# Patient Record
Sex: Female | Born: 1955
Health system: Southern US, Community
[De-identification: ages and names within clinical notes are randomized; demographics above are authoritative.]

## PROBLEM LIST (undated history)

## (undated) DIAGNOSIS — H9313 Tinnitus, bilateral: Secondary | ICD-10-CM

## (undated) DIAGNOSIS — G8929 Other chronic pain: Secondary | ICD-10-CM

## (undated) DIAGNOSIS — D649 Anemia, unspecified: Secondary | ICD-10-CM

## (undated) DIAGNOSIS — G43611 Persistent migraine aura with cerebral infarction, intractable, with status migrainosus: Secondary | ICD-10-CM

## (undated) DIAGNOSIS — I10 Essential (primary) hypertension: Secondary | ICD-10-CM

## (undated) DIAGNOSIS — F419 Anxiety disorder, unspecified: Secondary | ICD-10-CM

## (undated) DIAGNOSIS — T8859XA Other complications of anesthesia, initial encounter: Secondary | ICD-10-CM

## (undated) DIAGNOSIS — G5600 Carpal tunnel syndrome, unspecified upper limb: Secondary | ICD-10-CM

## (undated) DIAGNOSIS — F32A Depression, unspecified: Secondary | ICD-10-CM

## (undated) DIAGNOSIS — Z8639 Personal history of other endocrine, nutritional and metabolic disease: Secondary | ICD-10-CM

## (undated) DIAGNOSIS — E669 Obesity, unspecified: Secondary | ICD-10-CM

## (undated) DIAGNOSIS — E119 Type 2 diabetes mellitus without complications: Secondary | ICD-10-CM

## (undated) DIAGNOSIS — E785 Hyperlipidemia, unspecified: Secondary | ICD-10-CM

## (undated) DIAGNOSIS — R519 Headache, unspecified: Secondary | ICD-10-CM

## (undated) DIAGNOSIS — F329 Major depressive disorder, single episode, unspecified: Secondary | ICD-10-CM

## (undated) DIAGNOSIS — M1990 Unspecified osteoarthritis, unspecified site: Secondary | ICD-10-CM

## (undated) HISTORY — DX: Personal history of other endocrine, nutritional and metabolic disease: Z86.39

## (undated) HISTORY — DX: Obesity, unspecified: E66.9

## (undated) HISTORY — PX: TUBAL LIGATION: SHX77

## (undated) HISTORY — PX: CARDIAC CATHETERIZATION: SHX172

## (undated) HISTORY — PX: LEG SURGERY: SHX1003

## (undated) HISTORY — PX: HERNIA REPAIR: SHX51

## (undated) HISTORY — DX: Hyperlipidemia, unspecified: E78.5

## (undated) HISTORY — PX: APPENDECTOMY: SHX54

## (undated) HISTORY — DX: Other chronic pain: G89.29

## (undated) HISTORY — DX: Major depressive disorder, single episode, unspecified: F32.9

## (undated) HISTORY — DX: Anxiety disorder, unspecified: F41.9

## (undated) HISTORY — DX: Depression, unspecified: F32.A

## (undated) HISTORY — DX: Unspecified osteoarthritis, unspecified site: M19.90

## (undated) HISTORY — DX: Headache, unspecified: R51.9

## (undated) HISTORY — DX: Tinnitus, bilateral: H93.13

---
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
## 2000-03-21 LAB — CONVERTED CEMR LAB

## 2006-09-01 ENCOUNTER — Ambulatory Visit: Payer: Self-pay | Admitting: Family Medicine

## 2006-09-01 DIAGNOSIS — E785 Hyperlipidemia, unspecified: Secondary | ICD-10-CM | POA: Insufficient documentation

## 2006-09-01 DIAGNOSIS — F329 Major depressive disorder, single episode, unspecified: Secondary | ICD-10-CM

## 2006-09-01 DIAGNOSIS — D6489 Other specified anemias: Secondary | ICD-10-CM

## 2006-09-01 DIAGNOSIS — I10 Essential (primary) hypertension: Secondary | ICD-10-CM | POA: Insufficient documentation

## 2006-09-15 ENCOUNTER — Encounter: Payer: Self-pay | Admitting: Family Medicine

## 2006-09-15 LAB — CONVERTED CEMR LAB
Calcium: 8.9 mg/dL
Glucose, Bld: 239 mg/dL
Hemoglobin: 11.2 g/dL
LDL Cholesterol: 170 mg/dL
Platelets: 389 10*3/uL
Potassium: 4.1 meq/L
Sodium: 135 meq/L
Total Protein: 6.6 g/dL
Triglycerides: 312 mg/dL
WBC: 8.1 10*3/uL

## 2006-09-20 ENCOUNTER — Encounter: Payer: Self-pay | Admitting: Family Medicine

## 2006-09-20 LAB — CONVERTED CEMR LAB: Cholesterol, target level: 200 mg/dL

## 2006-10-05 ENCOUNTER — Telehealth: Payer: Self-pay | Admitting: Family Medicine

## 2006-12-08 ENCOUNTER — Other Ambulatory Visit: Admission: RE | Admit: 2006-12-08 | Discharge: 2006-12-08 | Payer: Self-pay | Admitting: Family Medicine

## 2006-12-08 ENCOUNTER — Encounter: Admission: RE | Admit: 2006-12-08 | Discharge: 2006-12-08 | Payer: Self-pay | Admitting: Family Medicine

## 2006-12-08 ENCOUNTER — Encounter: Payer: Self-pay | Admitting: Family Medicine

## 2006-12-08 ENCOUNTER — Ambulatory Visit: Payer: Self-pay | Admitting: Family Medicine

## 2006-12-08 IMAGING — MG MM DIGITAL SCREENING
7 series · 7 of 7 positions shown · non-contrast
Comparison: none

DG SCREEN MAMMOGRAM BILATERAL
Bilateral CC and MLO view(s) were taken.
Technologist: ARICHI

DIGITAL SCREENING MAMMOGRAM WITH CAD:
The breast tissue is heterogeneously dense.  No masses or malignant type calcifications are 
identified.

[R CC]
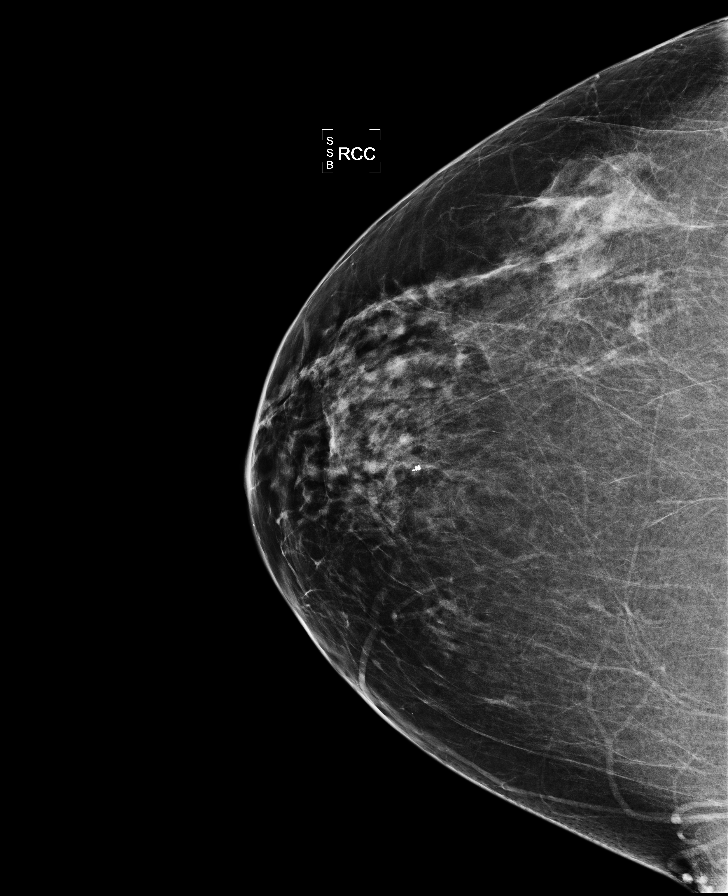

[L CC]
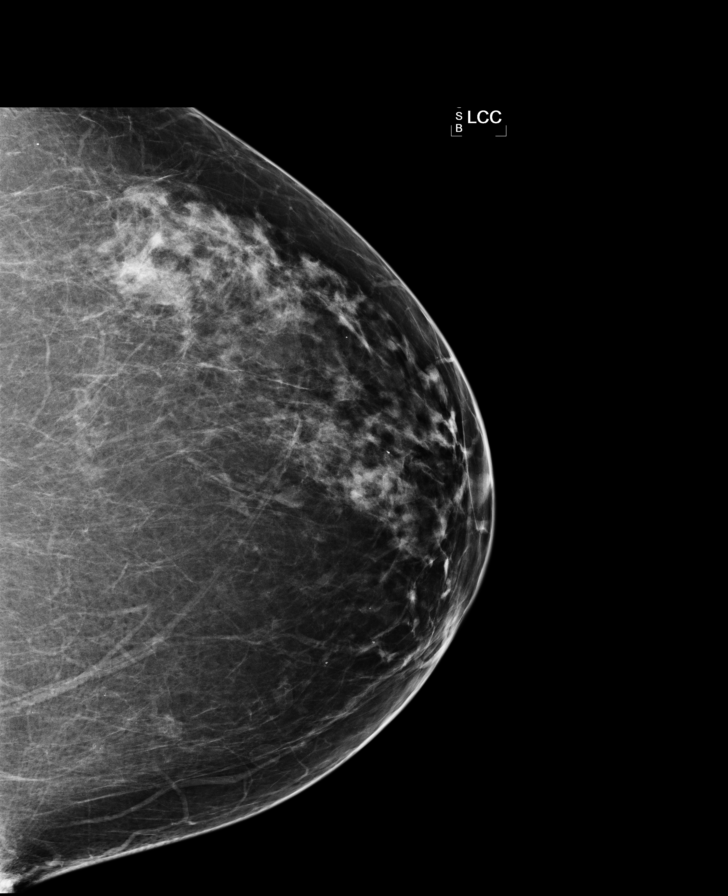

[L MLO (1 of 2)]
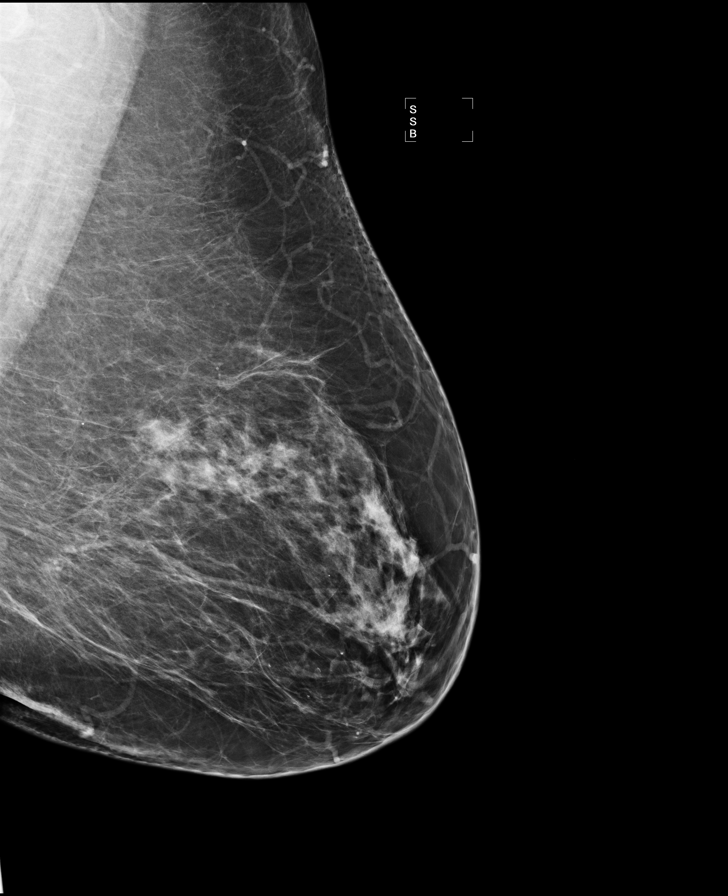

[R MLO (1 of 2)]
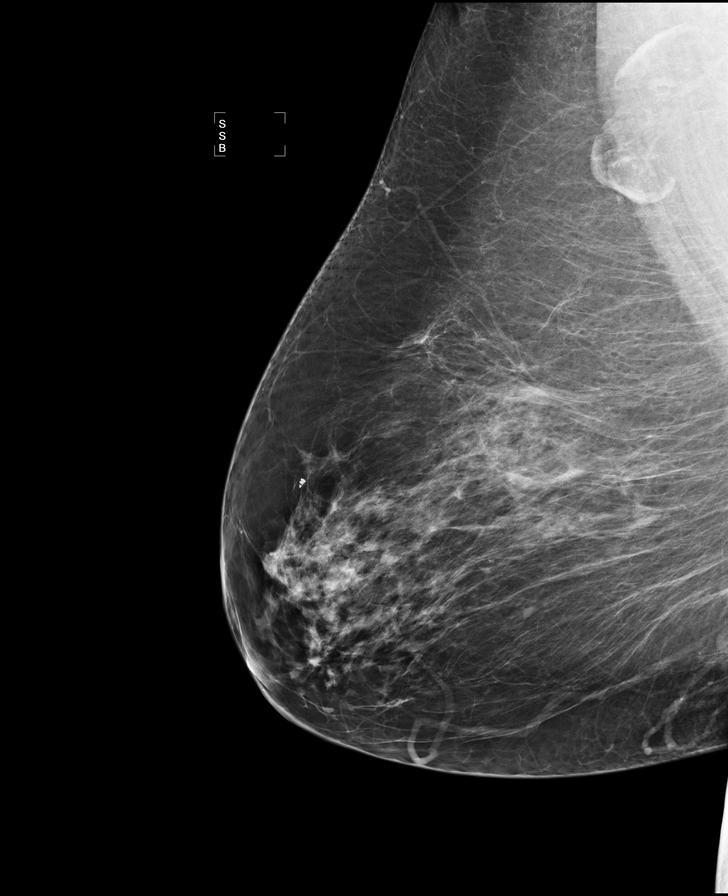

[L CV]
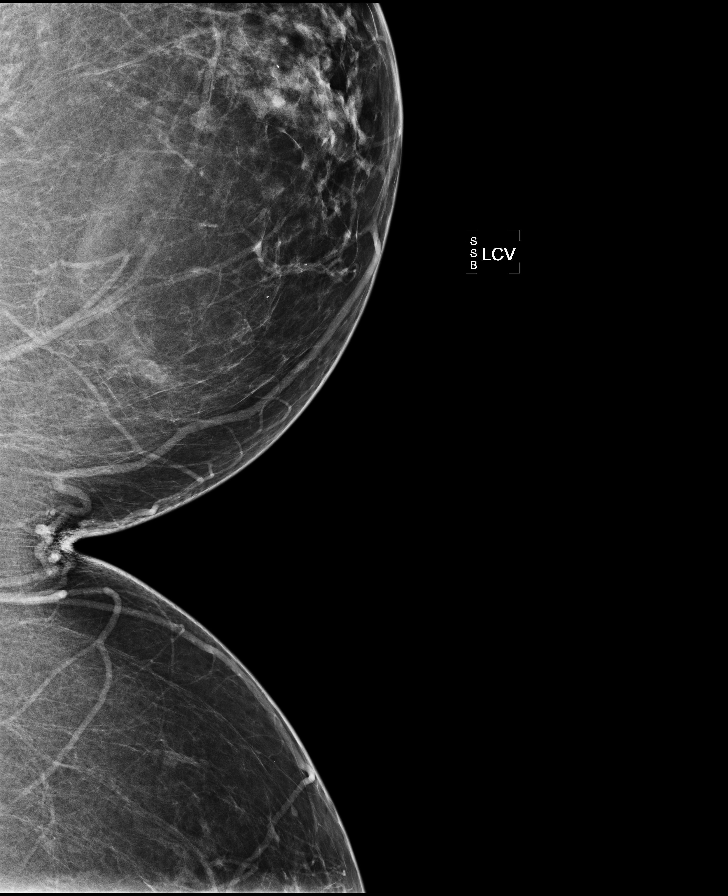

[R MLO (2 of 2)]
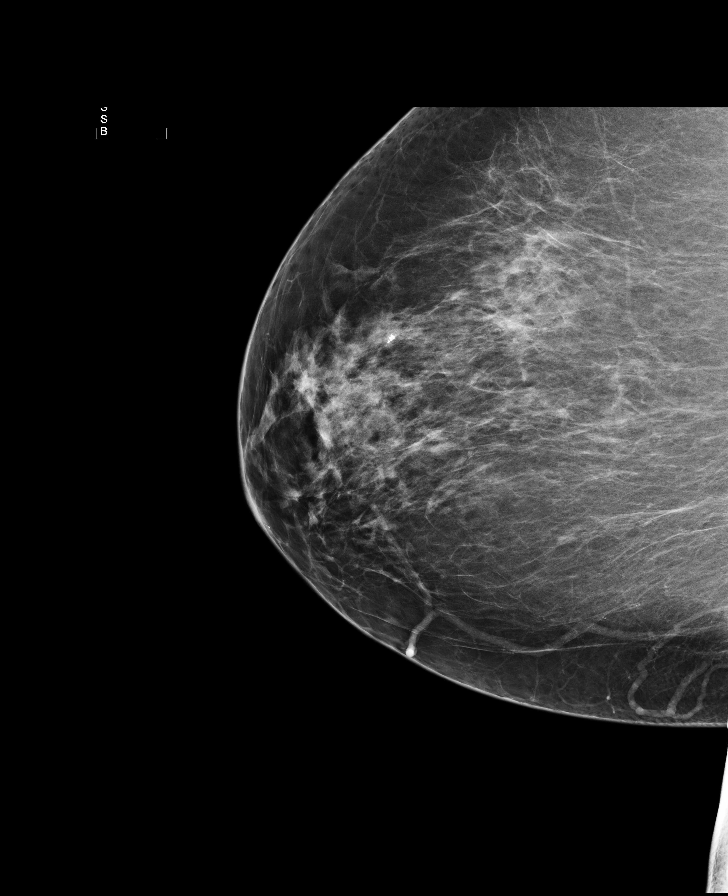

[L MLO (2 of 2)]
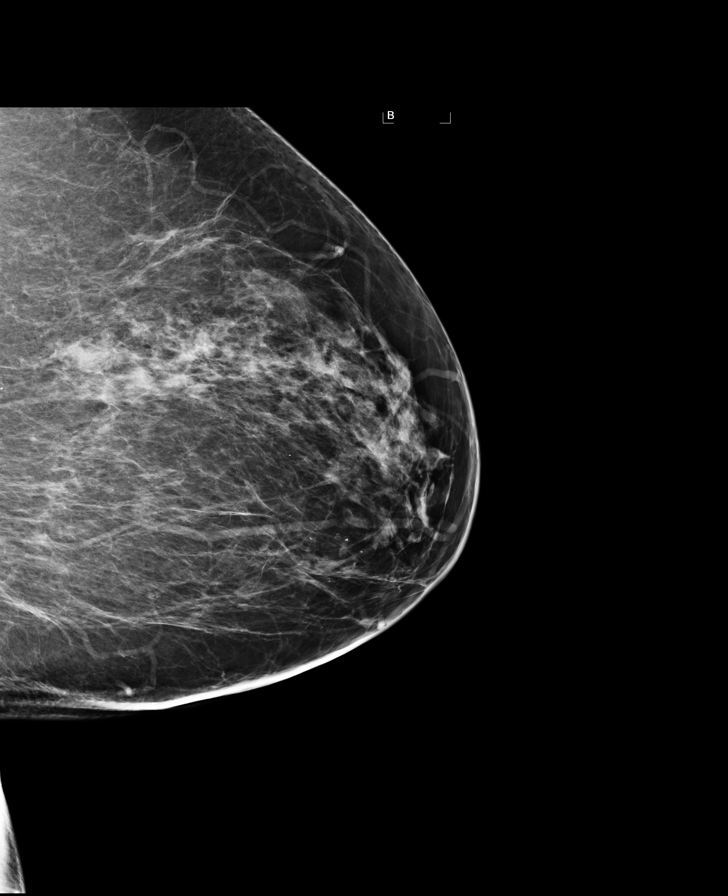

[7 of 7 positions shown; findings below may reference images not displayed]

IMPRESSION: No specific mammographic evidence of malignancy.  Next screening mammogram is recommended in one 
year.

ASSESSMENT: Negative - BI-RADS 1

Screening mammogram in 1 year.
THIS WAS ANALAYZED BY COMPUTER AIDED DETECTION. , THIS PROCEDURE WAS A DIGITAL MAMMOGRAM.

## 2007-01-08 ENCOUNTER — Telehealth: Payer: Self-pay | Admitting: Family Medicine

## 2007-02-06 ENCOUNTER — Ambulatory Visit: Payer: Self-pay | Admitting: Family Medicine

## 2007-02-06 ENCOUNTER — Telehealth (INDEPENDENT_AMBULATORY_CARE_PROVIDER_SITE_OTHER): Payer: Self-pay | Admitting: *Deleted

## 2007-02-06 DIAGNOSIS — Z794 Long term (current) use of insulin: Secondary | ICD-10-CM

## 2007-02-06 DIAGNOSIS — I639 Cerebral infarction, unspecified: Secondary | ICD-10-CM | POA: Insufficient documentation

## 2007-02-06 DIAGNOSIS — G43611 Persistent migraine aura with cerebral infarction, intractable, with status migrainosus: Secondary | ICD-10-CM

## 2007-02-06 DIAGNOSIS — R809 Proteinuria, unspecified: Secondary | ICD-10-CM

## 2007-02-06 DIAGNOSIS — R0789 Other chest pain: Secondary | ICD-10-CM | POA: Insufficient documentation

## 2007-02-06 DIAGNOSIS — E1129 Type 2 diabetes mellitus with other diabetic kidney complication: Secondary | ICD-10-CM | POA: Insufficient documentation

## 2007-02-06 HISTORY — DX: Cerebral infarction, unspecified: I63.9

## 2007-02-06 HISTORY — DX: Persistent migraine aura with cerebral infarction, intractable, with status migrainosus: G43.611

## 2007-02-06 LAB — CONVERTED CEMR LAB: Blood Glucose, Fasting: 172 mg/dL

## 2007-04-19 ENCOUNTER — Encounter: Payer: Self-pay | Admitting: Family Medicine

## 2007-06-21 ENCOUNTER — Ambulatory Visit: Payer: Self-pay | Admitting: Family Medicine

## 2007-06-21 DIAGNOSIS — R059 Cough, unspecified: Secondary | ICD-10-CM | POA: Insufficient documentation

## 2007-06-21 DIAGNOSIS — R05 Cough: Secondary | ICD-10-CM | POA: Insufficient documentation

## 2007-06-21 LAB — CONVERTED CEMR LAB
Blood Glucose, AC Bkfst: 144 mg/dL
Microalbumin U total vol: 80 mg/L

## 2007-07-09 ENCOUNTER — Telehealth: Payer: Self-pay | Admitting: Family Medicine

## 2007-07-19 ENCOUNTER — Telehealth: Payer: Self-pay | Admitting: Family Medicine

## 2007-08-01 ENCOUNTER — Encounter: Payer: Self-pay | Admitting: Family Medicine

## 2007-08-17 ENCOUNTER — Telehealth: Payer: Self-pay | Admitting: Family Medicine

## 2007-12-20 ENCOUNTER — Ambulatory Visit: Payer: Self-pay | Admitting: Family Medicine

## 2007-12-20 DIAGNOSIS — R5383 Other fatigue: Secondary | ICD-10-CM

## 2007-12-20 DIAGNOSIS — R5381 Other malaise: Secondary | ICD-10-CM

## 2007-12-20 LAB — CONVERTED CEMR LAB: Hgb A1c MFr Bld: 7.5 %

## 2008-01-25 ENCOUNTER — Encounter: Payer: Self-pay | Admitting: Family Medicine

## 2008-01-25 LAB — CONVERTED CEMR LAB
BUN: 12 mg/dL
CO2: 29 meq/L
Calcium: 8.6 mg/dL
Chloride: 102 meq/L
Cholesterol: 270 mg/dL
Creatinine, Ser: 0.7 mg/dL
Folate: >20 ng/mL
GFR calc non Af Amer: >60 mL/min
Glucose, Bld: 185 mg/dL
LDL Cholesterol: 186 mg/dL
Vitamin B-12: 603 pg/mL

## 2008-02-05 ENCOUNTER — Encounter: Payer: Self-pay | Admitting: Family Medicine

## 2008-02-08 ENCOUNTER — Encounter: Payer: Self-pay | Admitting: Family Medicine

## 2008-02-13 ENCOUNTER — Encounter: Payer: Self-pay | Admitting: Family Medicine

## 2008-06-27 ENCOUNTER — Telehealth: Payer: Self-pay | Admitting: Family Medicine

## 2008-06-27 DIAGNOSIS — E568 Deficiency of other vitamins: Secondary | ICD-10-CM

## 2013-08-03 ENCOUNTER — Emergency Department (HOSPITAL_BASED_OUTPATIENT_CLINIC_OR_DEPARTMENT_OTHER)
Admission: EM | Admit: 2013-08-03 | Discharge: 2013-08-03 | Disposition: A | Discharge status: Home / Self Care | Payer: No Typology Code available for payment source | Attending: Emergency Medicine | Admitting: Emergency Medicine

## 2013-08-03 ENCOUNTER — Encounter (HOSPITAL_BASED_OUTPATIENT_CLINIC_OR_DEPARTMENT_OTHER): Payer: Self-pay | Admitting: Emergency Medicine

## 2013-08-03 DIAGNOSIS — Z794 Long term (current) use of insulin: Secondary | ICD-10-CM | POA: Insufficient documentation

## 2013-08-03 DIAGNOSIS — I10 Essential (primary) hypertension: Secondary | ICD-10-CM | POA: Insufficient documentation

## 2013-08-03 DIAGNOSIS — Z9889 Other specified postprocedural states: Secondary | ICD-10-CM | POA: Insufficient documentation

## 2013-08-03 DIAGNOSIS — Z7982 Long term (current) use of aspirin: Secondary | ICD-10-CM | POA: Insufficient documentation

## 2013-08-03 DIAGNOSIS — Z79899 Other long term (current) drug therapy: Secondary | ICD-10-CM | POA: Insufficient documentation

## 2013-08-03 DIAGNOSIS — M79609 Pain in unspecified limb: Secondary | ICD-10-CM | POA: Insufficient documentation

## 2013-08-03 DIAGNOSIS — G5600 Carpal tunnel syndrome, unspecified upper limb: Secondary | ICD-10-CM | POA: Insufficient documentation

## 2013-08-03 DIAGNOSIS — E119 Type 2 diabetes mellitus without complications: Secondary | ICD-10-CM | POA: Insufficient documentation

## 2013-08-03 DIAGNOSIS — Z862 Personal history of diseases of the blood and blood-forming organs and certain disorders involving the immune mechanism: Secondary | ICD-10-CM | POA: Insufficient documentation

## 2013-08-03 DIAGNOSIS — M79641 Pain in right hand: Secondary | ICD-10-CM

## 2013-08-03 DIAGNOSIS — M79642 Pain in left hand: Secondary | ICD-10-CM

## 2013-08-03 HISTORY — DX: Type 2 diabetes mellitus without complications: E11.9

## 2013-08-03 HISTORY — DX: Essential (primary) hypertension: I10

## 2013-08-03 HISTORY — DX: Anemia, unspecified: D64.9

## 2013-08-03 HISTORY — DX: Carpal tunnel syndrome, unspecified upper limb: G56.00

## 2013-08-03 MED ORDER — HYDROCODONE-ACETAMINOPHEN 5-325 MG PO TABS: 1.0000 | ORAL_TABLET | Freq: Four times a day (QID) | ORAL | Status: DC | PRN

## 2013-08-03 NOTE — ED Notes (Signed)
Sudden onset of pain in right hand and lower arm at 11pm last night.  30 min later pain started in her left hand and forearm up to elbow.  Pain has continued, worse on left, all night.  Pt took Tylenol and Melatonin but has not been able to sleep all night due to the pain.

## 2013-08-03 NOTE — ED Provider Notes (Addendum)
CSN: [id number]     Arrival date & time 08/03/13  0450 History   First MD Initiated Contact with Patient 08/03/13 0559     Chief Complaint  Patient presents with  . Hand Pain     (Consider location/radiation/quality/duration/timing/severity/associated sxs/prior Treatment) HPI This is a 58 year old female with a remote history of carpal tunnel syndrome. She is here with pain in her hands that began yesterday evening about 11 PM. The pain began in her right hand and then about 30 minutes later began in the left hand. She describes the pain as an ache. It is moderate in intensity enough to keep her awake. Pain is worse with movement of her hands and fingers. There is radiation of the pain up the volar forearms on the ulnar side bilaterally. The pain is associated with paresthesias but not frank anesthesia. She is not aware of any injury to her hands. She denies repetitive work with her hands but she has been holding her iPad nightly for several hours. She does not localize the pain or paresthesias to any specific digits but rather is generalized. She has no history of diabetic neuropathy although she is diabetic. She states her sugar has been well controlled recently.  Past Medical History  Diagnosis Date  . Diabetes mellitus without complication   . Hypertension   . Anemia   . Carpal tunnel syndrome    Past Surgical History  Procedure Laterality Date  . Cardiac catheterization      Negative. Dx was Anxiety  . Leg surgery    . Appendectomy     No family history on file. History  Substance Use Topics  . Smoking status: Never Smoker   . Smokeless tobacco: Not on file  . Alcohol Use: No   OB History   Grav Para Term Preterm Abortions TAB SAB Ect Mult Living                 Review of Systems  All other systems reviewed and are negative.   Allergies  Aspirin  Home Medications   Prior to Admission medications   Medication Sig Start Date End Date Taking? Authorizing Provider   aspirin 81 MG tablet Take 81 mg by mouth at bedtime.   Yes Historical Provider, MD  DULoxetine (CYMBALTA) 60 MG capsule Take 60 mg by mouth daily.   Yes Historical Provider, MD  Insulin Glargine (LANTUS SOLOSTAR [place]) Inject 44 Units into the skin 2 (two) times daily.   Yes Historical Provider, MD  Liraglutide (VICTOZA) 18 MG/3ML SOPN Inject into the skin at bedtime.   Yes Historical Provider, MD  medroxyPROGESTERone (PROVERA) 10 MG tablet Take 10 mg by mouth daily.   Yes Historical Provider, MD  Pediatric Multivitamins-Iron (VITA DROPS/IRON PO) Take by mouth. VITA Iron   Yes Historical Provider, MD  quinapril-hydrochlorothiazide (ACCURETIC) 20-25 MG per tablet Take 1 tablet by mouth daily.   Yes Historical Provider, MD  rosuvastatin (CRESTOR) 10 MG tablet Take 10 mg by mouth daily.   Yes Historical Provider, MD  sitaGLIPtin-metformin (JANUMET) 50-1000 MG per tablet Take 1 tablet by mouth daily.   Yes Historical Provider, MD   BP 181/72  Pulse 76  Temp(Src) 98.2 F (36.8 C) (Oral)  Resp 20  Ht 4\' 11"  (1.499 m)  Wt 253 lb (114.76 kg)  BMI 51.07 kg/m2  SpO2 100%  Physical Exam General: Well-developed, well-nourished female in no acute distress; appearance consistent with age of record HENT: normocephalic; atraumatic Eyes: pupils equal, round and reactive to  light; extraocular muscles intact Neck: supple Heart: regular rate and rhythm Lungs: clear to auscultation bilaterally Abdomen: soft; nondistended; nontender; no masses or hepatosplenomegaly; bowel sounds present Extremities: No deformity; full range of motion; pulses normal Neurologic: Awake, alert and oriented; motor function intact in all extremities and symmetric; grip strength normal bilaterally; flexion and extension strength at the elbows normal bilaterally; negative Tinel's bilaterally; positive Phalen's bilaterally; sensation intact over both hands but with subjective alteration of sensation; no facial droop; normal  coordination speech; normal gait Skin: Warm and dry Psychiatric: Normal mood and affect    ED Course  Procedures (including critical care time)   MDM  Patient's exam is atypical for carpal tunnel syndrome. This may represent a neuropathy but diabetic neuropathy would not be expected to have such a rapid onset. She is otherwise neurologically intact. We will treat her pain and she will contact her primary care physician Monday morning.    Hanley SeamenJohn L Mikie Misner, MD 08/03/13 [phone]  Hanley SeamenJohn L Analea Muller, MD 08/03/13 [phone]

## 2013-08-03 NOTE — ED Notes (Signed)
Onset of pain to upper ext from elbow to hand  Rt first then 30 minutes later left,  Denies inj  States does hold i pad and ply on it at nights,  Grip equal both ext no change in pain w movement

## 2015-07-30 DIAGNOSIS — F419 Anxiety disorder, unspecified: Secondary | ICD-10-CM | POA: Insufficient documentation

## 2015-07-30 DIAGNOSIS — Z794 Long term (current) use of insulin: Secondary | ICD-10-CM | POA: Insufficient documentation

## 2015-07-30 DIAGNOSIS — F339 Major depressive disorder, recurrent, unspecified: Secondary | ICD-10-CM | POA: Insufficient documentation

## 2015-07-30 DIAGNOSIS — R809 Proteinuria, unspecified: Secondary | ICD-10-CM

## 2015-07-30 DIAGNOSIS — E1129 Type 2 diabetes mellitus with other diabetic kidney complication: Secondary | ICD-10-CM | POA: Insufficient documentation

## 2015-07-30 DIAGNOSIS — I2510 Atherosclerotic heart disease of native coronary artery without angina pectoris: Secondary | ICD-10-CM

## 2015-07-30 DIAGNOSIS — D509 Iron deficiency anemia, unspecified: Secondary | ICD-10-CM | POA: Insufficient documentation

## 2015-11-24 DIAGNOSIS — G4700 Insomnia, unspecified: Secondary | ICD-10-CM | POA: Insufficient documentation

## 2016-01-06 DIAGNOSIS — M180 Bilateral primary osteoarthritis of first carpometacarpal joints: Secondary | ICD-10-CM | POA: Insufficient documentation

## 2016-01-18 DIAGNOSIS — L732 Hidradenitis suppurativa: Secondary | ICD-10-CM | POA: Insufficient documentation

## 2016-01-18 DIAGNOSIS — L02229 Furuncle of trunk, unspecified: Secondary | ICD-10-CM | POA: Insufficient documentation

## 2016-08-10 DIAGNOSIS — S83242A Other tear of medial meniscus, current injury, left knee, initial encounter: Secondary | ICD-10-CM | POA: Insufficient documentation

## 2016-08-10 DIAGNOSIS — M1712 Unilateral primary osteoarthritis, left knee: Secondary | ICD-10-CM | POA: Insufficient documentation

## 2016-08-10 DIAGNOSIS — H9313 Tinnitus, bilateral: Secondary | ICD-10-CM | POA: Insufficient documentation

## 2016-09-13 DIAGNOSIS — R51 Headache: Secondary | ICD-10-CM

## 2016-09-13 DIAGNOSIS — R519 Headache, unspecified: Secondary | ICD-10-CM | POA: Insufficient documentation

## 2016-09-13 DIAGNOSIS — G5600 Carpal tunnel syndrome, unspecified upper limb: Secondary | ICD-10-CM | POA: Insufficient documentation

## 2017-09-12 DIAGNOSIS — R040 Epistaxis: Secondary | ICD-10-CM | POA: Insufficient documentation

## 2018-02-22 ENCOUNTER — Ambulatory Visit (INDEPENDENT_AMBULATORY_CARE_PROVIDER_SITE_OTHER): Payer: No Typology Code available for payment source | Admitting: Physician Assistant

## 2018-02-22 ENCOUNTER — Encounter: Payer: Self-pay | Admitting: Physician Assistant

## 2018-02-22 VITALS — BP 142/72 | HR 77 | Wt 259.0 lb

## 2018-02-22 DIAGNOSIS — Z7689 Persons encountering health services in other specified circumstances: Secondary | ICD-10-CM | POA: Diagnosis not present

## 2018-02-22 DIAGNOSIS — E1141 Type 2 diabetes mellitus with diabetic mononeuropathy: Secondary | ICD-10-CM

## 2018-02-22 DIAGNOSIS — E1129 Type 2 diabetes mellitus with other diabetic kidney complication: Secondary | ICD-10-CM | POA: Diagnosis not present

## 2018-02-22 DIAGNOSIS — E1169 Type 2 diabetes mellitus with other specified complication: Secondary | ICD-10-CM

## 2018-02-22 DIAGNOSIS — F330 Major depressive disorder, recurrent, mild: Secondary | ICD-10-CM

## 2018-02-22 DIAGNOSIS — Z1231 Encounter for screening mammogram for malignant neoplasm of breast: Secondary | ICD-10-CM

## 2018-02-22 DIAGNOSIS — Z794 Long term (current) use of insulin: Secondary | ICD-10-CM

## 2018-02-22 DIAGNOSIS — R809 Proteinuria, unspecified: Secondary | ICD-10-CM | POA: Diagnosis not present

## 2018-02-22 DIAGNOSIS — R600 Localized edema: Secondary | ICD-10-CM

## 2018-02-22 DIAGNOSIS — E1159 Type 2 diabetes mellitus with other circulatory complications: Secondary | ICD-10-CM | POA: Diagnosis not present

## 2018-02-22 DIAGNOSIS — Z0100 Encounter for examination of eyes and vision without abnormal findings: Secondary | ICD-10-CM

## 2018-02-22 DIAGNOSIS — E785 Hyperlipidemia, unspecified: Secondary | ICD-10-CM

## 2018-02-22 DIAGNOSIS — E119 Type 2 diabetes mellitus without complications: Secondary | ICD-10-CM

## 2018-02-22 DIAGNOSIS — I10 Essential (primary) hypertension: Secondary | ICD-10-CM

## 2018-02-22 DIAGNOSIS — K130 Diseases of lips: Secondary | ICD-10-CM

## 2018-02-22 LAB — POCT GLYCOSYLATED HEMOGLOBIN (HGB A1C): HBA1C, POC (CONTROLLED DIABETIC RANGE): 8.1 % — AB (ref 0.0–7.0)

## 2018-02-22 MED ORDER — SEMAGLUTIDE (1 MG/DOSE) 2 MG/1.5ML ~~LOC~~ SOPN
PEN_INJECTOR | SUBCUTANEOUS | 1 refills | Status: DC
Start: 2018-02-22 — End: 2018-03-02

## 2018-02-22 MED ORDER — AMLODIPINE BESYLATE 5 MG PO TABS: 5.0000 mg | ORAL_TABLET | Freq: Every day | ORAL | 0 refills | Status: DC

## 2018-02-22 MED ORDER — ROSUVASTATIN CALCIUM 40 MG PO TABS: 40.0000 mg | ORAL_TABLET | Freq: Every day | ORAL | 0 refills | Status: DC

## 2018-02-22 MED ORDER — MEDICAL COMPRESSION STOCKINGS MISC: 0 refills | Status: DC

## 2018-02-22 MED ORDER — PEN NEEDLES 32G X 4 MM MISC: 1.0000 | Freq: Two times a day (BID) | 1 refills | Status: DC

## 2018-02-22 MED ORDER — TRIAMCINOLONE ACETONIDE 0.5 % EX OINT: 1.0000 "application " | TOPICAL_OINTMENT | Freq: Two times a day (BID) | CUTANEOUS | 0 refills | Status: AC

## 2018-02-22 MED ORDER — SERTRALINE HCL 100 MG PO TABS: 100.0000 mg | ORAL_TABLET | Freq: Every day | ORAL | 1 refills | Status: DC

## 2018-02-22 MED ORDER — QUINAPRIL-HYDROCHLOROTHIAZIDE 20-12.5 MG PO TABS: 1.0000 | ORAL_TABLET | Freq: Every day | ORAL | 0 refills | Status: DC

## 2018-02-22 MED ORDER — INSULIN ISOPHANE & REGULAR (HUMAN 70-30)100 UNIT/ML KWIKPEN
PEN_INJECTOR | SUBCUTANEOUS | 0 refills | Status: DC
Start: 2018-02-22 — End: 2018-04-24

## 2018-02-22 MED ORDER — METFORMIN HCL 1000 MG PO TABS: 1000.0000 mg | ORAL_TABLET | Freq: Two times a day (BID) | ORAL | 0 refills | Status: DC

## 2018-02-22 MED FILL — ULTICARE PEN NDL 4MM 32G: 32G X 4 MM | 50 days supply | Qty: 100 | Fill #0

## 2018-02-22 MED FILL — HUMULIN 70/30 KWIKPEN: (70-30) 100 | 54 days supply | Qty: 45 | Fill #0

## 2018-02-22 MED FILL — ROSUVASTATIN CALCIUM 40 MG: 40 | 90 days supply | Qty: 90 | Fill #0

## 2018-02-22 MED FILL — QUINAPRIL-HCTZ 20-12.5 MG T: 20-12.5 | 90 days supply | Qty: 90 | Fill #0

## 2018-02-22 MED FILL — AMLODIPINE BESYLATE 5 MG TA: 5 | 90 days supply | Qty: 90 | Fill #0

## 2018-02-22 MED FILL — metFORMIN HCL 1000 MG TABS: 1000 | 90 days supply | Qty: 180 | Fill #0

## 2018-02-22 MED FILL — SERTRALINE HCL 100 MG TAB: 100 | 90 days supply | Qty: 90 | Fill #0

## 2018-02-22 MED FILL — TRIAMCINOLONE 0.5% OINTMENT: 0.5 | 14 days supply | Qty: 15 | Fill #0

## 2018-02-22 NOTE — Patient Instructions (Addendum)
Diabetes Preventive Care: - annual foot exam  - annual dilated eye exam with an eye doctor - self foot exams at least weekly - pneumonia vaccine once (booster in 5 years and at age 60) - annual influenza vaccine - twice yearly dental cleanings and yearly exam - goal blood pressure <140/90, ideally <130/80 - LDL cholesterol <70 - A1C <4.0 - body mass index (BMI) <25.0 - follow-up every 3 months if your A1C is not at goal - follow-up every 6 months if diabetes is well controlled    Edema Edema is an abnormal buildup of fluids in your bodytissues. Edema is somewhatdependent on gravity to pull the fluid to the lowest place in your body. That makes the condition more common in the legs and thighs (lower extremities). Painless swelling of the feet and ankles is common and becomes more likely as you get older. It is also common in looser tissues, like around your eyes. When the affected area is squeezed, the fluid may move out of that spot and leave a dent for a few moments. This dent is called pitting. What are the causes? There are many possible causes of edema. Eating too much salt and being on your feet or sitting for a long time can cause edema in your legs and ankles. Hot weather may make edema worse. Common medical causes of edema include:  Heart failure.  Liver disease.  Kidney disease.  Weak blood vessels in your legs.  Cancer.  An injury.  Pregnancy.  Some medications.  Obesity.  What are the signs or symptoms? Edema is usually painless.Your skin may look swollen or shiny. How is this diagnosed? Your health care provider may be able to diagnose edema by asking about your medical history and doing a physical exam. You may need to have tests such as X-rays, an electrocardiogram, or blood tests to check for medical conditions that may cause edema. How is this treated? Edema treatment depends on the cause. If you have heart, liver, or kidney disease, you need the  treatment appropriate for these conditions. General treatment may include:  Elevation of the affected body part above the level of your heart.  Compression of the affected body part. Pressure from elastic bandages or support stockings squeezes the tissues and forces fluid back into the blood vessels. This keeps fluid from entering the tissues.  Restriction of fluid and salt intake.  Use of a water pill (diuretic). These medications are appropriate only for some types of edema. They pull fluid out of your body and make you urinate more often. This gets rid of fluid and reduces swelling, but diuretics can have side effects. Only use diuretics as directed by your health care provider.  Follow these instructions at home:  Keep the affected body part above the level of your heart when you are lying down.  Do not sit still or stand for prolonged periods.  Do not put anything directly under your knees when lying down.  Do not wear constricting clothing or garters on your upper legs.  Exercise your legs to work the fluid back into your blood vessels. This may help the swelling go down.  Wear elastic bandages or support stockings to reduce ankle swelling as directed by your health care provider.  Eat a low-salt diet to reduce fluid if your health care provider recommends it.  Only take medicines as directed by your health care provider. Contact a health care provider if:  Your edema is not responding to treatment.  You have heart, liver, or kidney disease and notice symptoms of edema.  You have edema in your legs that does not improve after elevating them.  You have sudden and unexplained weight gain. Get help right away if:  You develop shortness of breath or chest pain.  You cannot breathe when you lie down.  You develop pain, redness, or warmth in the swollen areas.  You have heart, liver, or kidney disease and suddenly get edema.  You have a fever and your symptoms suddenly  get worse. This information is not intended to replace advice given to you by your health care provider. Make sure you discuss any questions you have with your health care provider. Document Released: 03/07/2005 Document Revised: 08/13/2015 Document Reviewed: 12/28/2012 Elsevier Interactive Patient Education  2017 [employer]

## 2018-02-22 NOTE — Progress Notes (Signed)
HPI:                                                                Jessica [name] is a 62 y.o. female [facility] today to establish care  Current concerns: medication refills  DMII: taking Novolin 45 units at breakfast and at supper, Metformin 1000 mg tiwce a day. She was prescribed Ozempic, but lost her insurance coverage in July and has not been able to afford it. She would like to re-start this because it was helping her with weight loss. Compliant with medications.  Denies polydipsia, polyuria. Denies blurred vision or vision change. Denies extremity pain, altered sensation and paresthesias.  Denies ulcers/wounds on feet. Hx of DKA/HHS: no Blood glucose readings: 130's Hypoglycemia frequency: none Severe hypoglycemia (requiring 3rd party assistance): none  Depression/Anxiety: taking Sertraline 100 mg without difficulty.She was previously taking 200 mg and Alprazolam prn, but states her anxiety is much better and she no longer requires Alprazolam. Denies symptoms of mania/hypomania. Denies suicidal thinking. Denies auditory/visual hallucinations.  Reports, dry, cracked painful skin at the corners of her mouth. She has tried aquafor, vaseline and burts bees medicated lip balm.   Reports chronic painless swelling of her left foot and lower leg present for years. Swelling waxes and wanes and is worse after prolonged sitting at work. She denies calf tenderness or claudication. Denies history of DVT. She has a left meniscal tear and OA of her left knee.  Depression screen PHQ 2/9 02/22/2018  Decreased Interest 2  Down, Depressed, Hopeless 1  PHQ - 2 Score 3  Altered sleeping 1  Tired, decreased energy 1  Change in appetite 3  Feeling bad or failure about yourself  1  Trouble concentrating 2  Moving slowly or fidgety/restless 0  Suicidal thoughts 0  PHQ-9 Score 11    GAD 7 : Generalized Anxiety Score 02/22/2018   Nervous, Anxious, on Edge 0  Control/stop worrying 1  Worry too much - different things 1  Trouble relaxing 0  Restless 0  Easily annoyed or irritable 0  Afraid - awful might happen 0  Total GAD 7 Score 2      Past Medical History:  Diagnosis Date  . Anemia   . Arthritis   . Carpal tunnel syndrome   . Depression   . Diabetes mellitus without complication (HCC)   . Hypertension   . Obesity    Past Surgical History:  Procedure Laterality Date  . APPENDECTOMY    . CARDIAC CATHETERIZATION     Negative. Dx was Anxiety  . HERNIA REPAIR    . LEG SURGERY    . TUBAL LIGATION     Social History   Tobacco Use  . Smoking status: Never Smoker  . Smokeless tobacco: Never Used  Substance Use Topics  . Alcohol use: No    Frequency: Never   family history includes Diabetes in her maternal aunt; Hyperlipidemia in her mother; Hypertension in her mother; Skin cancer in her maternal uncle; Stroke in her father; Thyroid cancer in her sister.    ROS: Review of Systems  Constitutional: Positive for malaise/fatigue.  Eyes:       + vision change  Cardiovascular: Positive for leg swelling.  Musculoskeletal: Positive for joint pain.       + heel pain     Medications: Current Outpatient Medications  Medication Sig Dispense Refill  . amLODipine (NORVASC) 5 MG tablet Take 1 tablet (5 mg total) by mouth daily. 90 tablet 0  . Insulin Isophane & Regular Human (HUMULIN 70/30 MIX) (70-30) 100 UNIT/ML PEN Inject 42 units with breakfast and 42 units with supper 15 pen 0  . Insulin Pen Needle (PEN NEEDLES) 32G X 4 MM MISC Inject 1 each into the skin 2 (two) times daily. 180 each 1  . metFORMIN (GLUCOPHAGE) 1000 MG tablet Take 1 tablet (1,000 mg total) by mouth 2 (two) times daily with a meal. 180 tablet 0  . quinapril-hydrochlorothiazide (ACCURETIC) 20-12.5 MG tablet Take 1 tablet by mouth daily. 90 tablet 0  . rosuvastatin (CRESTOR) 40 MG tablet Take 1 tablet (40 mg total) by mouth at  bedtime. 90 tablet 0  . Semaglutide, 1 MG/DOSE, 2 MG/1.5ML SOPN Inject 0.5 mg into the skin once a week for 28 days, THEN 1 mg once a week. 1.5 mL 1  . sertraline (ZOLOFT) 100 MG tablet Take 1 tablet (100 mg total) by mouth daily. 90 tablet 1  . aspirin 81 MG tablet Take 81 mg by mouth at bedtime.    Clinical research associate Bandages & Supports (MEDICAL COMPRESSION STOCKINGS) MISC Knee high medium compression stockings, large circumference, fit to patient's preference 2 each 0  . ferrous sulfate 325 (65 FE) MG tablet Take 65 mg by mouth daily.    Marland Kitchen Specialty Vitamins Products (MAGNESIUM, AMINO ACID CHELATE,) 133 MG tablet Take by mouth.    . triamcinolone ointment (KENALOG) 0.5 % Apply 1 application topically 2 (two) times daily for 14 days. Cycle 1 week on 1 week off 15 g 0   No current facility-administered medications for this visit.    Allergies  Allergen Reactions  . Aspirin     REACTION: cause bruising       Objective:  BP (!) 142/72   Pulse 77   Wt 259 lb (117.5 kg)   BMI 52.31 kg/m  Gen:  alert, not ill-appearing, no distress, appropriate for age, obese female HEENT: head normocephalic without obvious abnormality, conjunctiva and cornea clear, trachea midline Pulm: Normal work of breathing, normal phonation, clear to auscultation bilaterally, no wheezes, rales or rhonchi CV: Normal rate, regular rhythm, s1 and s2 distinct, no murmurs, clicks or rubs  Neuro: alert and oriented x 3, no tremor MSK: extremities atraumatic, normal gait and station, LLE significantly swollen compared to RLE, 2+ pitting edema of the midcalf, no calf tenderness, DP and PT pulses intact Skin: intact, no rashes on exposed skin, no jaundice, no cyanosis Psych: well-groomed, cooperative, good eye contact, depressed mood, affect is not mood-congruent, speech is articulate, and thought processes clear and goal-directed  Diabetic Foot Exam - Simple   Simple Foot Form Diabetic Foot exam was performed with the following  findings:  Yes 02/22/2018  3:11 PM  Visual Inspection No deformities, no ulcerations, no other skin breakdown bilaterally:  Yes Sensation Testing See comments:  Yes Pulse Check Posterior Tibialis and Dorsalis pulse intact bilaterally:  Yes Comments Sensation absent to monofilament on left foot at 9 and 7     Results for orders placed or performed in visit on 02/22/18 (from the past 72 hour(s))  POCT HgB A1C     Status: Abnormal   Collection Time: 02/22/18  4:00 PM  Result Value Ref Range   Hemoglobin  A1C     HbA1c POC (<> result, manual entry)     HbA1c, POC (prediabetic range)     HbA1c, POC (controlled diabetic range) 8.1 (A) 0.0 - 7.0 %   No results found.    Assessment and Plan: 62 y.o. female with   .Dola was seen today for establish care.  Diagnoses and all orders for this visit:  Encounter to establish care  Hypertension associated with diabetes (HCC) -     amLODipine (NORVASC) 5 MG tablet; Take 1 tablet (5 mg total) by mouth daily. -     quinapril-hydrochlorothiazide (ACCURETIC) 20-12.5 MG tablet; Take 1 tablet by mouth daily.  Hyperlipidemia associated with type 2 diabetes mellitus (HCC) -     rosuvastatin (CRESTOR) 40 MG tablet; Take 1 tablet (40 mg total) by mouth at bedtime.  Type 2 diabetes mellitus with microalbuminuria, with long-term current use of insulin (HCC) -     Insulin Isophane & Regular Human (HUMULIN 70/30 MIX) (70-30) 100 UNIT/ML PEN; Inject 42 units with breakfast and 42 units with supper -     Insulin Pen Needle (PEN NEEDLES) 32G X 4 MM MISC; Inject 1 each into the skin 2 (two) times daily. -     metFORMIN (GLUCOPHAGE) 1000 MG tablet; Take 1 tablet (1,000 mg total) by mouth 2 (two) times daily with a meal. -     Semaglutide, 1 MG/DOSE, 2 MG/1.5ML SOPN; Inject 0.5 mg into the skin once a week for 28 days, THEN 1 mg once a week. -     POCT HgB A1C  Angular cheilitis -     triamcinolone ointment (KENALOG) 0.5 %; Apply 1 application topically 2  (two) times daily for 14 days. Cycle 1 week on 1 week off  Breast cancer screening by mammogram -     MM 3D SCREEN BREAST BILATERAL; Future  Leg edema, left -     Elastic Bandages & Supports (MEDICAL COMPRESSION STOCKINGS) MISC; Knee high medium compression stockings, large circumference, fit to patient's preference -     US Venous Img Lower Unilateral Left  Diabetic eye exam (HCC) -     Ambulatory referral to Ophthalmology  Mild episode of recurrent major depressive disorder (HCC) -     sertraline (ZOLOFT) 100 MG tablet; Take 1 tablet (100 mg total) by mouth daily.  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 Venous insufficiency also likely. DVT unlikely given absence of pain and chronic duration. Will obtain venous US - compression stockings - counseled on general measures for peripheral edema  Uncontrolled Type 2 Diabetes - A1c 8.1 - we can likely get her at goal with Ozempic. Re-start 0.5 mg weekly x 4 weeks, then increase to 1 mg weekly - referral placed for diabetic eye exam - LDL goal <70, cont crestor - BP goal <130/80, cont ACE, thiazide and CCB - foot exam performed today, mild neuropathy of left foot  Depression - PHQ9=11, no acute safety issues - she desires to keep Sertraline dose the same for now  Patient education and anticipatory guidance given Patient agrees with treatment plan Follow-up in 3 months for diabetes/med mgmt or  sooner as needed if symptoms worsen or fail to improve  Jessica Russian PA-C

## 2018-02-23 ENCOUNTER — Encounter: Payer: Self-pay | Admitting: Physician Assistant

## 2018-02-23 DIAGNOSIS — I152 Hypertension secondary to endocrine disorders: Secondary | ICD-10-CM | POA: Insufficient documentation

## 2018-02-23 DIAGNOSIS — E1159 Type 2 diabetes mellitus with other circulatory complications: Secondary | ICD-10-CM | POA: Insufficient documentation

## 2018-02-23 DIAGNOSIS — I10 Essential (primary) hypertension: Secondary | ICD-10-CM

## 2018-02-23 DIAGNOSIS — R600 Localized edema: Secondary | ICD-10-CM | POA: Insufficient documentation

## 2018-02-23 DIAGNOSIS — E785 Hyperlipidemia, unspecified: Secondary | ICD-10-CM

## 2018-02-23 DIAGNOSIS — K130 Diseases of lips: Secondary | ICD-10-CM | POA: Insufficient documentation

## 2018-02-23 DIAGNOSIS — E1141 Type 2 diabetes mellitus with diabetic mononeuropathy: Secondary | ICD-10-CM | POA: Insufficient documentation

## 2018-02-23 DIAGNOSIS — E1169 Type 2 diabetes mellitus with other specified complication: Secondary | ICD-10-CM | POA: Insufficient documentation

## 2018-03-01 ENCOUNTER — Telehealth: Payer: Self-pay | Admitting: Physician Assistant

## 2018-03-01 DIAGNOSIS — R809 Proteinuria, unspecified: Principal | ICD-10-CM

## 2018-03-01 DIAGNOSIS — E1129 Type 2 diabetes mellitus with other diabetic kidney complication: Secondary | ICD-10-CM

## 2018-03-01 DIAGNOSIS — Z794 Long term (current) use of insulin: Principal | ICD-10-CM

## 2018-03-01 NOTE — Telephone Encounter (Signed)
Received a fax from [facility] that the patient will have to try and fail Bydureon Bcise,or Trulicity before she can take Ozempic. Please advise.

## 2018-03-02 MED ORDER — DULAGLUTIDE 0.75 MG/0.5ML ~~LOC~~ SOAJ: SUBCUTANEOUS | 2 refills | Status: AC

## 2018-03-02 MED FILL — TRULICITY 0.75 MG/0.5 ML PE: 0.75 | 28 days supply | Qty: 2 | Fill #0

## 2018-03-02 NOTE — Telephone Encounter (Signed)
Left message for patient to call back about medication change.   I called the pharmacy to cancel out the Ozempic and to fill the Trulicity. Jessica [name] was notified and did not have any further questions.

## 2018-03-02 NOTE — Telephone Encounter (Signed)
New Rx sent for Trulicity to replace Ozempic Start with 0.75 mg once weekly for 1 month, then increase to 1.5 mg weekly

## 2018-03-08 ENCOUNTER — Ambulatory Visit: Payer: No Typology Code available for payment source

## 2018-03-08 ENCOUNTER — Ambulatory Visit (INDEPENDENT_AMBULATORY_CARE_PROVIDER_SITE_OTHER): Payer: No Typology Code available for payment source

## 2018-03-08 DIAGNOSIS — Z1231 Encounter for screening mammogram for malignant neoplasm of breast: Secondary | ICD-10-CM | POA: Diagnosis not present

## 2018-03-08 IMAGING — MG DIGITAL SCREENING BILATERAL MAMMOGRAM WITH TOMO AND CAD
6 of 12 series · 6 of 36 positions shown · non-contrast
Comparison: Previous exam(s).

CLINICAL DATA: Screening.

EXAM:
DIGITAL SCREENING BILATERAL MAMMOGRAM WITH TOMO AND CAD

[R CC synth-2D]
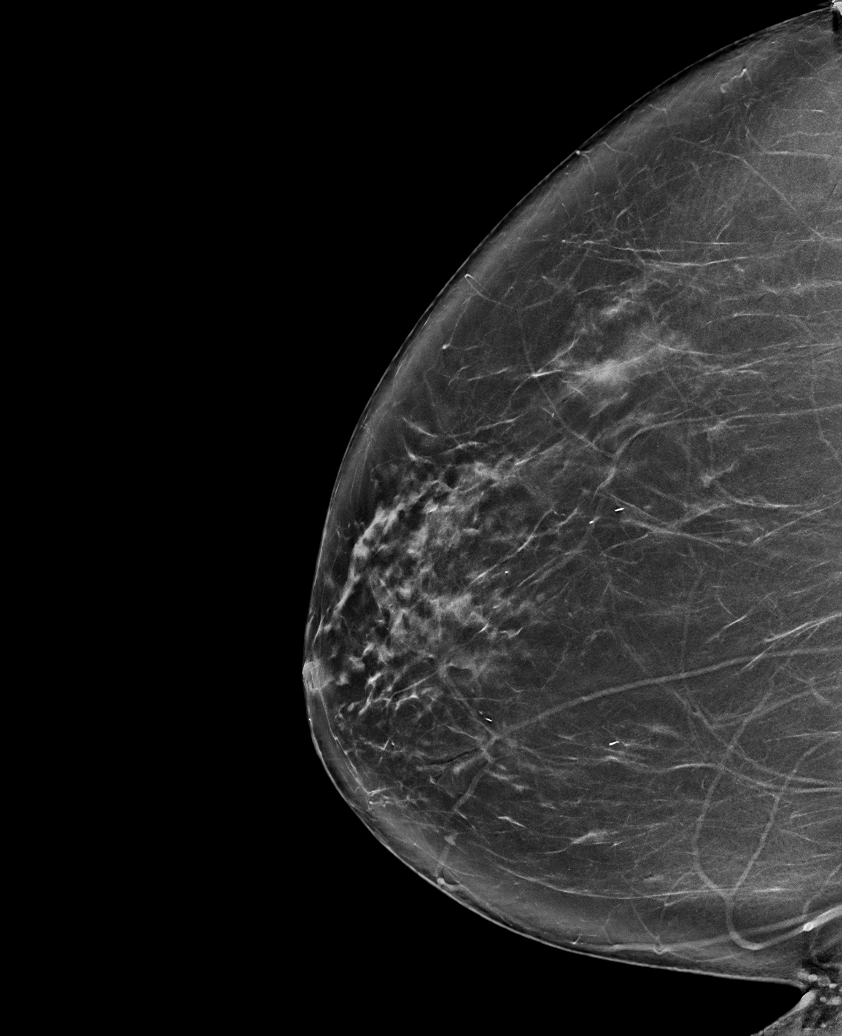

[L CC synth-2D]
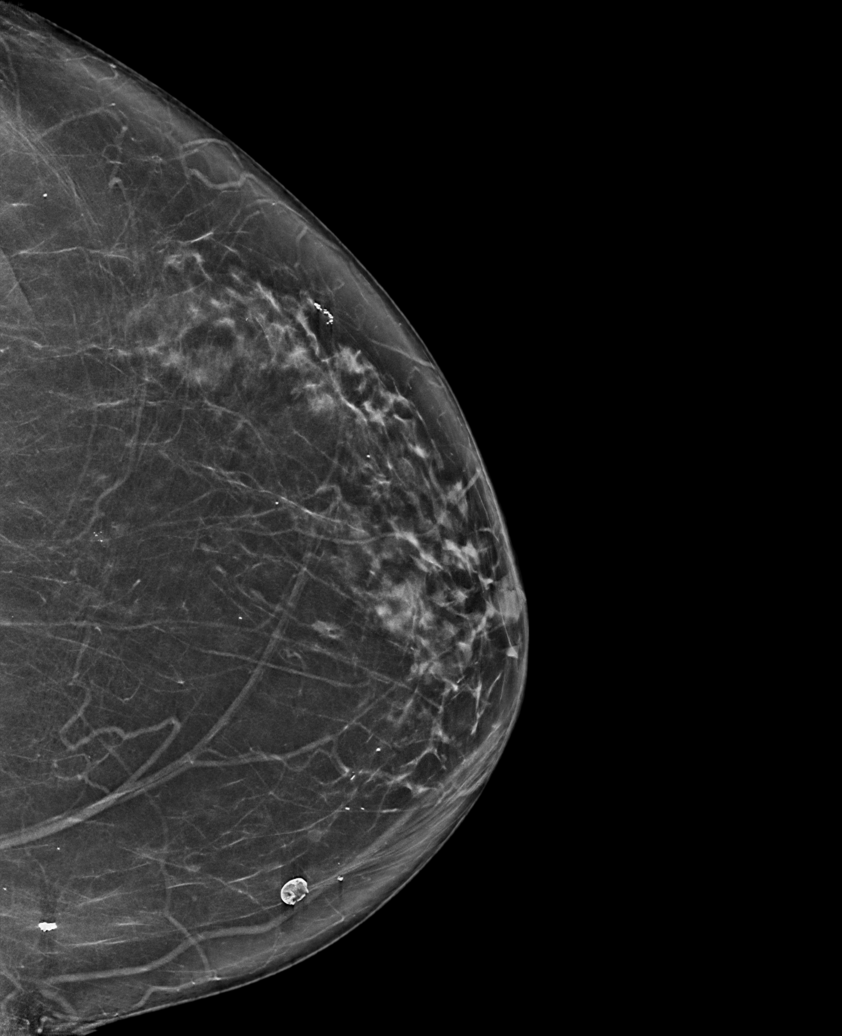

[L MLO synth-2D (1 of 2)]
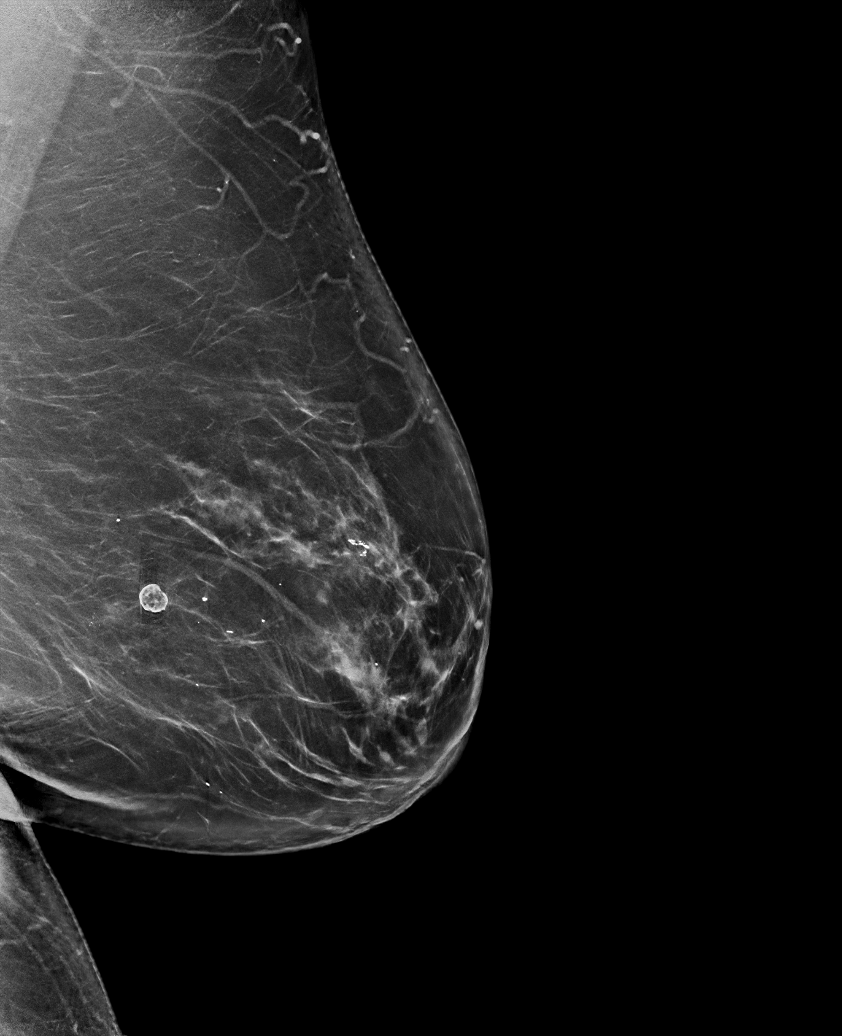

[R MLO synth-2D (1 of 2)]
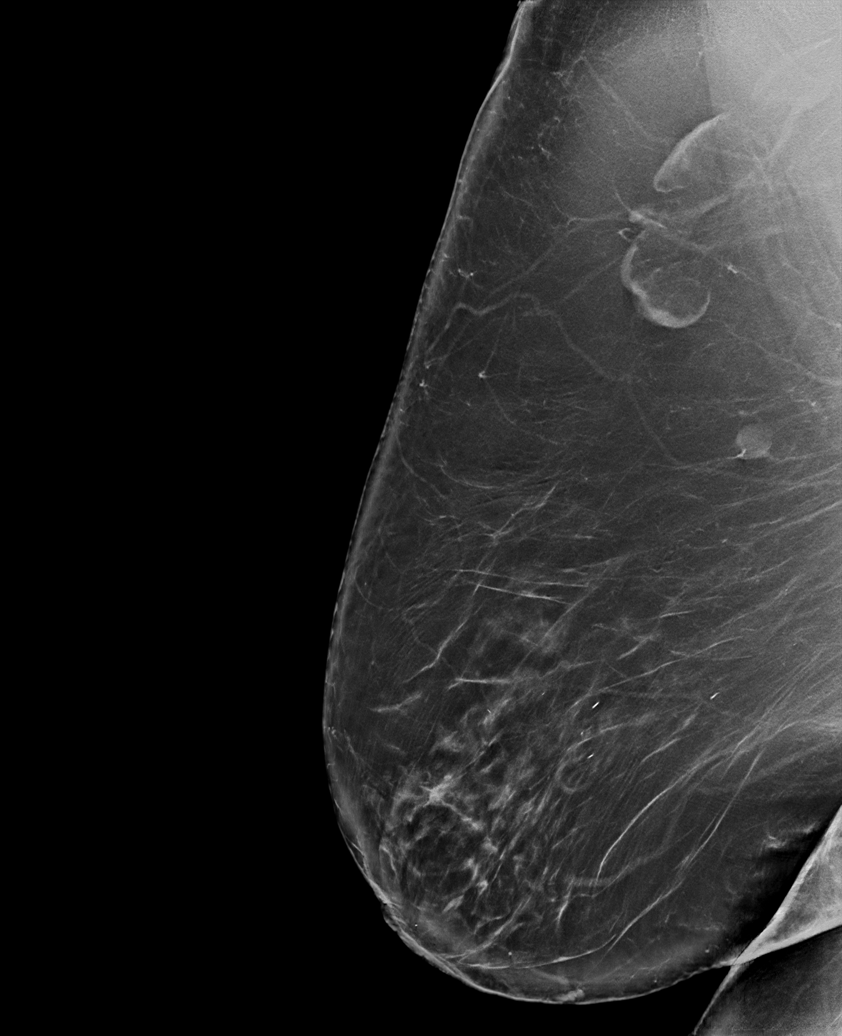

[R MLO synth-2D (2 of 2)]
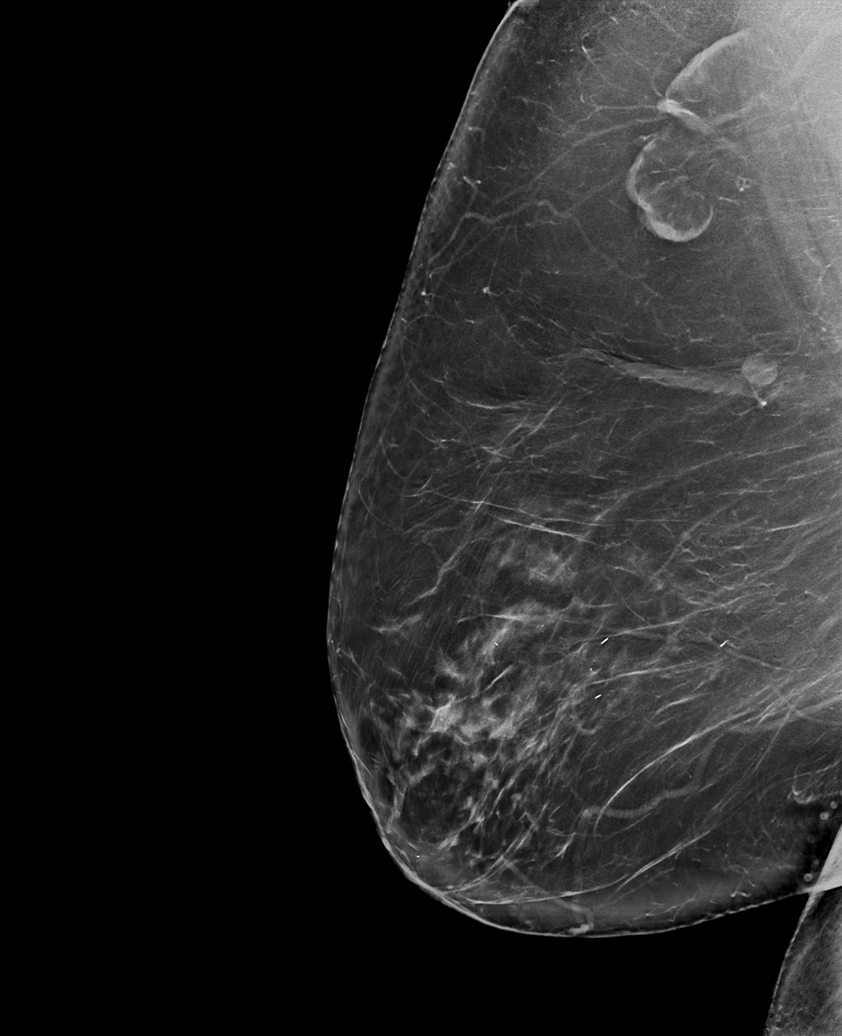

[L MLO synth-2D (2 of 2)]
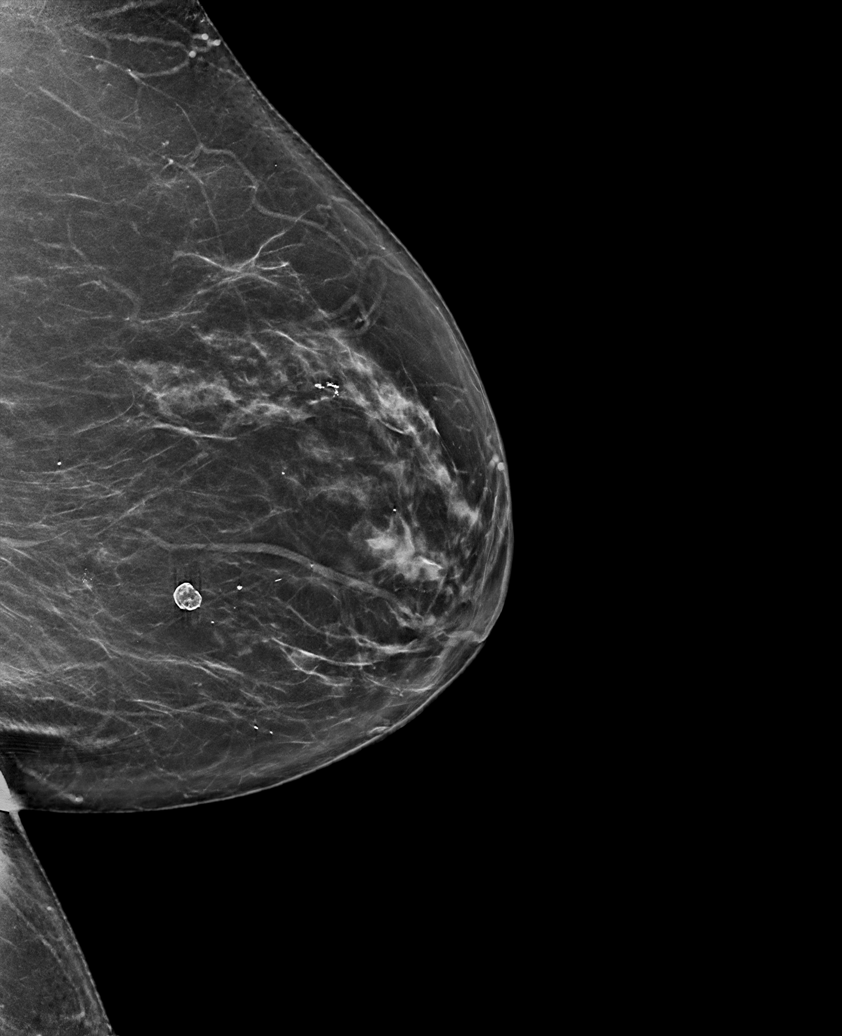

[6 of 36 positions shown; findings below may reference images not displayed]

ACR Breast Density Category b: There are scattered areas of
fibroglandular density.
FINDINGS: There are no findings suspicious for malignancy. Images were
processed with CAD.
IMPRESSION: No mammographic evidence of malignancy. A result letter of this
screening mammogram will be mailed directly to the patient.

RECOMMENDATION:
Screening mammogram in one year. (Code:[TQ])

BI-RADS CATEGORY  1: Negative.

## 2018-03-26 ENCOUNTER — Ambulatory Visit: Payer: No Typology Code available for payment source | Admitting: Physician Assistant

## 2018-04-24 ENCOUNTER — Other Ambulatory Visit: Payer: Self-pay | Admitting: Physician Assistant

## 2018-04-24 DIAGNOSIS — I10 Essential (primary) hypertension: Principal | ICD-10-CM

## 2018-04-24 DIAGNOSIS — Z794 Long term (current) use of insulin: Principal | ICD-10-CM

## 2018-04-24 DIAGNOSIS — E1129 Type 2 diabetes mellitus with other diabetic kidney complication: Secondary | ICD-10-CM

## 2018-04-24 DIAGNOSIS — R809 Proteinuria, unspecified: Principal | ICD-10-CM

## 2018-04-24 DIAGNOSIS — E1159 Type 2 diabetes mellitus with other circulatory complications: Secondary | ICD-10-CM

## 2018-04-24 MED FILL — HUMULIN 70/30 KWIKPEN: (70-30) 100 | 54 days supply | Qty: 45 | Fill #0

## 2018-04-25 MED FILL — TRULICITY 0.75 MG/0.5 ML PE: 0.75 | 28 days supply | Qty: 2 | Fill #1

## 2018-05-02 MED FILL — QUINAPRIL-HCTZ 20-12.5 MG T: 20-12.5 | 30 days supply | Qty: 30 | Fill #0

## 2018-05-18 ENCOUNTER — Other Ambulatory Visit: Payer: Self-pay | Admitting: *Deleted

## 2018-05-18 DIAGNOSIS — I10 Essential (primary) hypertension: Principal | ICD-10-CM

## 2018-05-18 DIAGNOSIS — I152 Hypertension secondary to endocrine disorders: Secondary | ICD-10-CM

## 2018-05-18 DIAGNOSIS — E1159 Type 2 diabetes mellitus with other circulatory complications: Secondary | ICD-10-CM

## 2018-05-18 MED ORDER — QUINAPRIL-HYDROCHLOROTHIAZIDE 20-12.5 MG PO TABS: 1.0000 | ORAL_TABLET | Freq: Every day | ORAL | 0 refills | Status: DC

## 2018-05-18 NOTE — Progress Notes (Signed)
refill 

## 2018-05-25 ENCOUNTER — Ambulatory Visit: Payer: No Typology Code available for payment source | Admitting: Physician Assistant

## 2018-05-30 MED FILL — QUINAPRIL-HCTZ 20-12.5 MG T: 20-12.5 | 30 days supply | Qty: 30 | Fill #0

## 2018-06-05 ENCOUNTER — Ambulatory Visit (INDEPENDENT_AMBULATORY_CARE_PROVIDER_SITE_OTHER): Payer: No Typology Code available for payment source | Admitting: Physician Assistant

## 2018-06-05 ENCOUNTER — Other Ambulatory Visit: Payer: Self-pay

## 2018-06-05 ENCOUNTER — Encounter: Payer: Self-pay | Admitting: Physician Assistant

## 2018-06-05 VITALS — BP 157/96 | HR 60 | Wt 256.0 lb

## 2018-06-05 DIAGNOSIS — E1159 Type 2 diabetes mellitus with other circulatory complications: Secondary | ICD-10-CM

## 2018-06-05 DIAGNOSIS — Z794 Long term (current) use of insulin: Secondary | ICD-10-CM | POA: Diagnosis not present

## 2018-06-05 DIAGNOSIS — G5621 Lesion of ulnar nerve, right upper limb: Secondary | ICD-10-CM | POA: Diagnosis not present

## 2018-06-05 DIAGNOSIS — E1129 Type 2 diabetes mellitus with other diabetic kidney complication: Secondary | ICD-10-CM

## 2018-06-05 DIAGNOSIS — I10 Essential (primary) hypertension: Secondary | ICD-10-CM

## 2018-06-05 DIAGNOSIS — R809 Proteinuria, unspecified: Secondary | ICD-10-CM

## 2018-06-05 DIAGNOSIS — I152 Hypertension secondary to endocrine disorders: Secondary | ICD-10-CM

## 2018-06-05 DIAGNOSIS — F330 Major depressive disorder, recurrent, mild: Secondary | ICD-10-CM | POA: Diagnosis not present

## 2018-06-05 LAB — POCT GLYCOSYLATED HEMOGLOBIN (HGB A1C): HbA1c, POC (controlled diabetic range): 9.7 % — AB (ref 0.0–7.0)

## 2018-06-05 MED ORDER — SERTRALINE HCL 100 MG PO TABS: 100.0000 mg | ORAL_TABLET | Freq: Every day | ORAL | 1 refills | Status: DC

## 2018-06-05 MED ORDER — METFORMIN HCL 1000 MG PO TABS: 1000.0000 mg | ORAL_TABLET | Freq: Two times a day (BID) | ORAL | 0 refills | Status: DC

## 2018-06-05 MED ORDER — PEN NEEDLES 32G X 4 MM MISC: 1.0000 | Freq: Two times a day (BID) | 1 refills | Status: DC

## 2018-06-05 MED ORDER — QUINAPRIL-HYDROCHLOROTHIAZIDE 20-12.5 MG PO TABS: 2.0000 | ORAL_TABLET | Freq: Every day | ORAL | 0 refills | Status: DC

## 2018-06-05 MED ORDER — AMLODIPINE BESYLATE 5 MG PO TABS: 5.0000 mg | ORAL_TABLET | Freq: Every day | ORAL | 0 refills | Status: DC

## 2018-06-05 MED ORDER — INSULIN ISOPHANE & REGULAR (HUMAN 70-30)100 UNIT/ML KWIKPEN: PEN_INJECTOR | SUBCUTANEOUS | 0 refills | Status: DC

## 2018-06-05 MED ORDER — QUINAPRIL-HYDROCHLOROTHIAZIDE 20-25 MG PO TABS: 2.0000 | ORAL_TABLET | Freq: Every day | ORAL | 0 refills | Status: DC

## 2018-06-05 MED ORDER — DULAGLUTIDE 1.5 MG/0.5ML ~~LOC~~ SOAJ: 1.5000 mg | SUBCUTANEOUS | 1 refills | Status: DC

## 2018-06-05 MED FILL — metFORMIN HCL 1000 MG TABS: 1000 | 90 days supply | Qty: 180 | Fill #0

## 2018-06-05 MED FILL — HUMULIN 70/30 KWIKPEN: (70-30) 100 | 54 days supply | Qty: 45 | Fill #0

## 2018-06-05 MED FILL — TRULICITY 1.5 MG/0.5 ML PEN: 1.5 | 28 days supply | Qty: 2 | Fill #0

## 2018-06-05 MED FILL — AMLODIPINE BESYLATE 5 MG TA: 5 | 90 days supply | Qty: 90 | Fill #0

## 2018-06-05 MED FILL — SERTRALINE HCL 100 MG TAB: 100 | 90 days supply | Qty: 90 | Fill #0

## 2018-06-05 MED FILL — ULTICARE PEN NDL 4MM 32G: 32G X 4 MM | 90 days supply | Qty: 200 | Fill #0

## 2018-06-05 NOTE — Patient Instructions (Signed)
Diabetes Preventive Care: - annual foot exam  - annual dilated eye exam with an eye doctor - self foot exams at least weekly - pneumonia vaccine once (booster in 5 years and at age 63) - annual influenza vaccine - twice yearly dental cleanings and yearly exam - goal blood pressure <140/90, ideally <130/80 - LDL cholesterol <70 - A1C <7.0 - body mass index (BMI) <25.0 - follow-up every 3 months if your A1C is not at goal - follow-up every 6 months if diabetes is well controlled   Carbohydrate Counting for Diabetes Mellitus, Adult  Carbohydrate counting is a method of keeping track of how many carbohydrates you eat. Eating carbohydrates naturally increases the amount of sugar (glucose) in the blood. Counting how many carbohydrates you eat helps keep your blood glucose within normal limits, which helps you manage your diabetes (diabetes mellitus). It is important to know how many carbohydrates you can safely have in each meal. This is different for every person. A diet and nutrition specialist (registered dietitian) can help you make a meal plan and calculate how many carbohydrates you should have at each meal and snack. Carbohydrates are found in the following foods:  Grains, such as breads and cereals.  Dried beans and soy products.  Starchy vegetables, such as potatoes, peas, and corn.  Fruit and fruit juices.  Milk and yogurt.  Sweets and snack foods, such as cake, cookies, candy, chips, and soft drinks. How do I count carbohydrates? There are two ways to count carbohydrates in food. You can use either of the methods or a combination of both. Reading "Nutrition Facts" on packaged food The "Nutrition Facts" list is included on the labels of almost all packaged foods and beverages in the [place] It includes:  The serving size.  Information about nutrients in each serving, including the grams (g) of carbohydrate per serving. To use the "Nutrition Facts":  Decide how many servings  you will have.  Multiply the number of servings by the number of carbohydrates per serving.  The resulting number is the total amount of carbohydrates that you will be having. Learning standard serving sizes of other foods When you eat carbohydrate foods that are not packaged or do not include "Nutrition Facts" on the label, you need to measure the servings in order to count the amount of carbohydrates:  Measure the foods that you will eat with a food scale or measuring cup, if needed.  Decide how many standard-size servings you will eat.  Multiply the number of servings by 15. Most carbohydrate-rich foods have about 15 g of carbohydrates per serving. ? For example, if you eat 8 oz (170 g) of strawberries, you will have eaten 2 servings and 30 g of carbohydrates (2 servings x 15 g = 30 g).  For foods that have more than one food mixed, such as soups and casseroles, you must count the carbohydrates in each food that is included. The following list contains standard serving sizes of common carbohydrate-rich foods. Each of these servings has about 15 g of carbohydrates:   hamburger bun or  English muffin.   oz (15 mL) syrup.   oz (14 g) jelly.  1 slice of bread.  1 six-inch tortilla.  3 oz (85 g) cooked rice or pasta.  4 oz (113 g) cooked dried beans.  4 oz (113 g) starchy vegetable, such as peas, corn, or potatoes.  4 oz (113 g) hot cereal.  4 oz (113 g) mashed potatoes or  of a  large baked potato.  4 oz (113 g) canned or frozen fruit.  4 oz (120 mL) fruit juice.  4-6 crackers.  6 chicken nuggets.  6 oz (170 g) unsweetened dry cereal.  6 oz (170 g) plain fat-free yogurt or yogurt sweetened with artificial sweeteners.  8 oz (240 mL) milk.  8 oz (170 g) fresh fruit or one small piece of fruit.  24 oz (680 g) popped popcorn. Example of carbohydrate counting Sample meal  3 oz (85 g) chicken breast.  6 oz (170 g) brown rice.  4 oz (113 g) corn.  8 oz (240  mL) milk.  8 oz (170 g) strawberries with sugar-free whipped topping. Carbohydrate calculation 1. Identify the foods that contain carbohydrates: ? Rice. ? Corn. ? Milk. ? Strawberries. 2. Calculate how many servings you have of each food: ? 2 servings rice. ? 1 serving corn. ? 1 serving milk. ? 1 serving strawberries. 3. Multiply each number of servings by 15 g: ? 2 servings rice x 15 g = 30 g. ? 1 serving corn x 15 g = 15 g. ? 1 serving milk x 15 g = 15 g. ? 1 serving strawberries x 15 g = 15 g. 4. Add together all of the amounts to find the total grams of carbohydrates eaten: ? 30 g + 15 g + 15 g + 15 g = 75 g of carbohydrates total. Summary  Carbohydrate counting is a method of keeping track of how many carbohydrates you eat.  Eating carbohydrates naturally increases the amount of sugar (glucose) in the blood.  Counting how many carbohydrates you eat helps keep your blood glucose within normal limits, which helps you manage your diabetes.  A diet and nutrition specialist (registered dietitian) can help you make a meal plan and calculate how many carbohydrates you should have at each meal and snack. This information is not intended to replace advice given to you by your health care provider. Make sure you discuss any questions you have with your health care provider. Document Released: 03/07/2005 Document Revised: 09/14/2016 Document Reviewed: 08/19/2015 Elsevier Interactive Patient Education  2019 [employer].

## 2018-06-05 NOTE — Progress Notes (Signed)
HPI:                                                                Jessica [name] is a 63 y.o. female who presents to [facility]: Primary Care [facility] today to establish care  Current concerns: medication refills  DMII: taking Novolin 45 units at breakfast and at supper, Metformin 1000 mg tiwce a day. She was started on Trulicity 75 mg weekly 3 months ago (Ozempic was off formulary), but she doesn't think it is helping. Compliant with medications.  Reports she is eating 1-2 meals per day and is not eating the right things. Denies polydipsia, polyuria. Denies blurred vision or vision change. Denies extremity pain, altered sensation and paresthesias.  Denies ulcers/wounds on feet. Hx of DKA/HHS: no Blood glucose readings: none to report Hypoglycemia frequency: none Severe hypoglycemia (requiring 3rd party assistance): none  HTN: taking Amlodipine in the evening and quinapril-hydrochlorothiazide in the morning.  Reports that she is supposed to be taking 2 tablets of the quinapril HCTZ (total dose 40- 25 mg), so she has been running out early. Compliant with medications. Does not monitor BP's at home. Denies vision change, headache, chest pain with exertion, orthopnea, lightheadedness, syncope and edema. Risk factors include: DM2, obesity, postmenopausal   Depression/Anxiety: taking Sertraline 100 mg without difficulty.She was previously taking 200 mg and Alprazolam prn, but states her anxiety is much better and she no longer requires Alprazolam. Denies symptoms of mania/hypomania. Denies suicidal thinking. Denies auditory/visual hallucinations.  Reports intermittent right elbow pain associated with numbness/tingling. She is right handed. Denies known injury or trauma.  Depression screen PHQ 2/9 02/22/2018  Decreased Interest 2  Down, Depressed, Hopeless 1  PHQ - 2 Score 3  Altered sleeping 1  Tired, decreased energy 1  Change in appetite 3  Feeling bad  or failure about yourself  1  Trouble concentrating 2  Moving slowly or fidgety/restless 0  Suicidal thoughts 0  PHQ-9 Score 11    GAD 7 : Generalized Anxiety Score 02/22/2018  Nervous, Anxious, on Edge 0  Control/stop worrying 1  Worry too much - different things 1  Trouble relaxing 0  Restless 0  Easily annoyed or irritable 0  Afraid - awful might happen 0  Total GAD 7 Score 2      Past Medical History:  Diagnosis Date  . Anemia   . Arthritis   . Carpal tunnel syndrome   . Depression   . Diabetes mellitus without complication (HCC)   . Hypertension   . Obesity    Past Surgical History:  Procedure Laterality Date  . APPENDECTOMY    . CARDIAC CATHETERIZATION     Negative. Dx was Anxiety  . HERNIA REPAIR    . LEG SURGERY    . TUBAL LIGATION     Social History   Tobacco Use  . Smoking status: Never Smoker  . Smokeless tobacco: Never Used  Substance Use Topics  . Alcohol use: No    Frequency: Never   family history includes Diabetes in her maternal aunt; Hyperlipidemia in her mother; Hypertension in her mother; Skin cancer in her maternal uncle; Stroke in her father; Thyroid cancer in her sister.    ROS: Review of Systems  Constitutional: Positive for  malaise/fatigue.  Eyes:       + vision change  Cardiovascular: Positive for leg swelling.  Musculoskeletal: Positive for joint pain.       + heel pain  Neurological: Positive for headaches.  Psychiatric/Behavioral: Positive for depression.     Medications: Current Outpatient Medications  Medication Sig Dispense Refill  . amLODipine (NORVASC) 5 MG tablet Take 1 tablet (5 mg total) by mouth daily. 90 tablet 0  . aspirin 81 MG tablet Take 81 mg by mouth at bedtime.    . Dulaglutide (TRULICITY) 1.5 MG/0.5ML SOPN Inject 1.5 mg into the skin once a week. 12 pen 1  . Elastic Bandages & Supports (MEDICAL COMPRESSION STOCKINGS) MISC Knee high medium compression stockings, large circumference, fit to patient's  preference 2 each 0  . ferrous sulfate 325 (65 FE) MG tablet Take 65 mg by mouth daily.    . Insulin Isophane & Regular Human (HUMULIN 70/30 KWIKPEN) (70-30) 100 UNIT/ML PEN INJECT 42 UNITS UNDER THE SKIN WITH BREAKFAST AND 42 UNITS WITH SUPPER 45 mL 0  . Insulin Pen Needle (PEN NEEDLES) 32G X 4 MM MISC Inject 1 each into the skin 2 (two) times daily. 180 each 1  . metFORMIN (GLUCOPHAGE) 1000 MG tablet Take 1 tablet (1,000 mg total) by mouth 2 (two) times daily with a meal. 180 tablet 0  . quinapril-hydrochlorothiazide (ACCURETIC) 20-12.5 MG tablet Take 2 tablets by mouth daily. 180 tablet 0  . rosuvastatin (CRESTOR) 40 MG tablet Take 1 tablet (40 mg total) by mouth at bedtime. 90 tablet 0  . sertraline (ZOLOFT) 100 MG tablet Take 1 tablet (100 mg total) by mouth daily. 90 tablet 1  . Specialty Vitamins Products (MAGNESIUM, AMINO ACID CHELATE,) 133 MG tablet Take by mouth.     No current facility-administered medications for this visit.    Allergies  Allergen Reactions  . Aspirin     REACTION: cause bruising       Objective:  BP (!) 157/96   Pulse 60   Wt 256 lb (116.1 kg)   BMI 51.71 kg/m  Gen:  alert, not ill-appearing, no distress, appropriate for age, obese female HEENT: head normocephalic without obvious abnormality, conjunctiva and cornea clear, wearing glasses, trachea midline Pulm: Normal work of breathing, normal phonation, clear to auscultation bilaterally, no wheezes, rales or rhonchi CV: Normal rate, regular rhythm, s1 and s2 distinct, no murmurs, clicks or rubs  Neuro: alert and oriented x 3, no tremor MSK: extremities atraumatic, normal gait and station, 2+ peripheral edema bilaterally Skin: intact, no rashes on exposed skin, no jaundice, no cyanosis Psych: well-groomed, cooperative, good eye contact, depressed mood, affect is not mood-congruent, speech is articulate, and thought processes clear and goal-directed    Assessment and Plan: 63 y.o. female with    .Diagnoses and all orders for this visit:  Type 2 diabetes mellitus with microalbuminuria, with long-term current use of insulin (HCC) -     POCT HgB A1C -     Insulin Pen Needle (PEN NEEDLES) 32G X 4 MM MISC; Inject 1 each into the skin 2 (two) times daily. -     Dulaglutide (TRULICITY) 1.5 MG/0.5ML SOPN; Inject 1.5 mg into the skin once a week. -     COMPLETE METABOLIC PANEL WITH GFR -     Insulin Isophane & Regular Human (HUMULIN 70/30 KWIKPEN) (70-30) 100 UNIT/ML PEN; INJECT 42 UNITS UNDER THE SKIN WITH BREAKFAST AND 42 UNITS WITH SUPPER -     metFORMIN (GLUCOPHAGE) 1000 MG  tablet; Take 1 tablet (1,000 mg total) by mouth 2 (two) times daily with a meal.  Hypertension associated with diabetes (HCC) -     COMPLETE METABOLIC PANEL WITH GFR -     amLODipine (NORVASC) 5 MG tablet; Take 1 tablet (5 mg total) by mouth daily. -     quinapril-hydrochlorothiazide (ACCURETIC) 20-12.5 MG tablet; Take 2 tablets by mouth daily.  Mild episode of recurrent major depressive disorder (HCC) -     sertraline (ZOLOFT) 100 MG tablet; Take 1 tablet (100 mg total) by mouth daily.  Ulnar neuropathy at elbow, right  Microalbuminuria due to type 2 diabetes mellitus (HCC)  Other orders -     Discontinue: quinapril-hydrochlorothiazide (ACCURETIC) 20-25 MG tablet; Take 2 tablets by mouth daily.     - Personally reviewed PMH, PSH, PFH, medications, allergies, HM - Age-appropriate cancer screening: overdue for colon cancer screening, breast cancer screening and cervical cancer screening - Personally reviewed labs in Care Everywhere from 10/31/17 - Influenza UTD per patient - Tdap UTD per patient - PHQ2 positive   Uncontrolled Type 2 Diabetes Lab Results  Component Value Date   HGBA1C 9.7 (A) 06/05/2018  - poorly controlled. Optimizing Trulicity 1.5 mg weekly. May need to switch her to [facility] if A1C is not at goal in 3 months - referral placed for diabetic eye exam 3 months ago, reminded her to  schedule  - LDL goal <70, cont crestor - BP goal <130/80, cont ACE, thiazide and CCB - foot exam UTD  Ulnar neuropathy, right -Counseled on wearing elbow sleeve or Ace wrap to keep elbow in extension at night during sleep.  She reports she is able to accomplish this using a pillow and declines elbow sleeve today  Hypertension, microalbuminuria Blood pressure out of range in office today, asymptomatic She reports that she is just coming off of night shift and feeling extremely stressed out Continue current medications Monitor log blood pressures at home CMP pending, splinted we will double check that her quinapril does not need to be renally dosed.  Last GFR June 2019 equals 88, serum creatinine 0.75  Patient education and anticipatory guidance given Patient agrees with treatment plan Follow-up in 3 months for diabetes/med mgmt or sooner as needed if symptoms worsen or fail to improve  Levonne Hubert PA-C

## 2018-06-06 LAB — COMPLETE METABOLIC PANEL WITH GFR
AG Ratio: 1.3 (calc) (ref 1.0–2.5)
ALT: 15 U/L (ref 6–29)
AST: 17 U/L (ref 10–35)
Albumin: 3.8 g/dL (ref 3.6–5.1)
Alkaline phosphatase (APISO): 86 U/L (ref 37–153)
BUN: 15 mg/dL (ref 7–25)
CO2: 29 mmol/L (ref 20–32)
Calcium: 9.2 mg/dL (ref 8.6–10.4)
Chloride: 98 mmol/L (ref 98–110)
Creat: 0.71 mg/dL (ref 0.50–0.99)
GFR, Est African American: 107 mL/min/{1.73_m2} (ref >=60)
GFR, Est Non African American: 93 mL/min/{1.73_m2} (ref >=60)
Globulin: 2.9 g/dL (calc) (ref 1.9–3.7)
Glucose, Bld: 195 mg/dL — ABNORMAL HIGH (ref 65–139)
Potassium: 3.8 mmol/L (ref 3.5–5.3)
Sodium: 137 mmol/L (ref 135–146)
Total Bilirubin: 0.5 mg/dL (ref 0.2–1.2)
Total Protein: 6.7 g/dL (ref 6.1–8.1)

## 2018-06-25 MED FILL — QUINAPRIL-HCTZ 20-12.5 MG T: 20-12.5 | 90 days supply | Qty: 180 | Fill #0

## 2018-08-21 ENCOUNTER — Other Ambulatory Visit: Payer: Self-pay | Admitting: Physician Assistant

## 2018-08-21 DIAGNOSIS — E1129 Type 2 diabetes mellitus with other diabetic kidney complication: Secondary | ICD-10-CM

## 2018-08-21 MED FILL — HUMULIN 70/30 KWIKPEN: (70-30) 100 | 54 days supply | Qty: 45 | Fill #0

## 2018-09-04 IMAGING — US US EXTREM LOW VENOUS*L*
1 series · 13 of 24 positions shown · non-contrast
Comparison: None.

CLINICAL DATA: 62-year-old female with intermittent left lower
extremity swelling for the past 6 months



[Series 1: us extrem low venous*left* · 0.08mm/px · 13 of 31 slices shown]
[im 1/31]
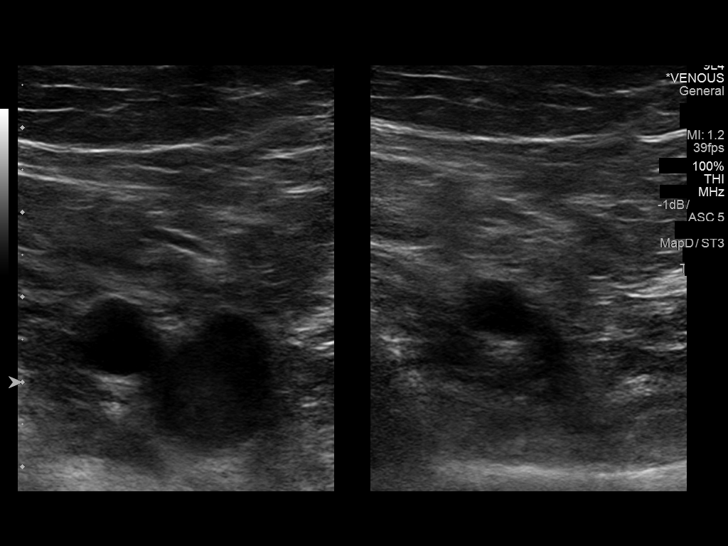
[im 3/31]
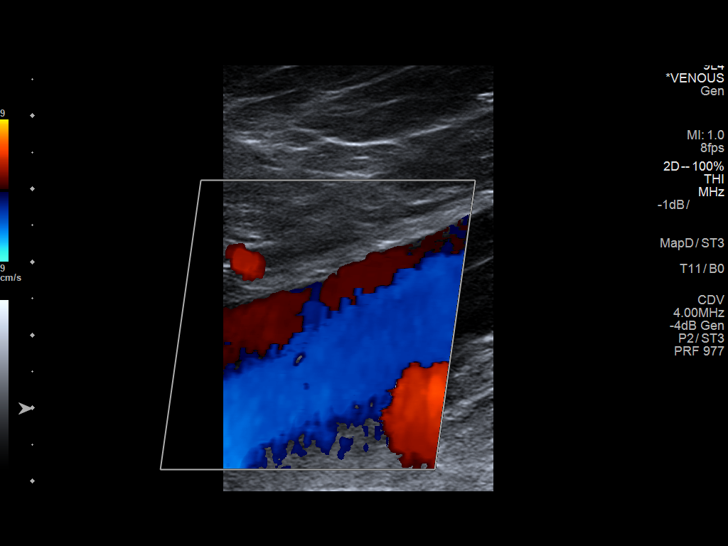
[im 6/31]
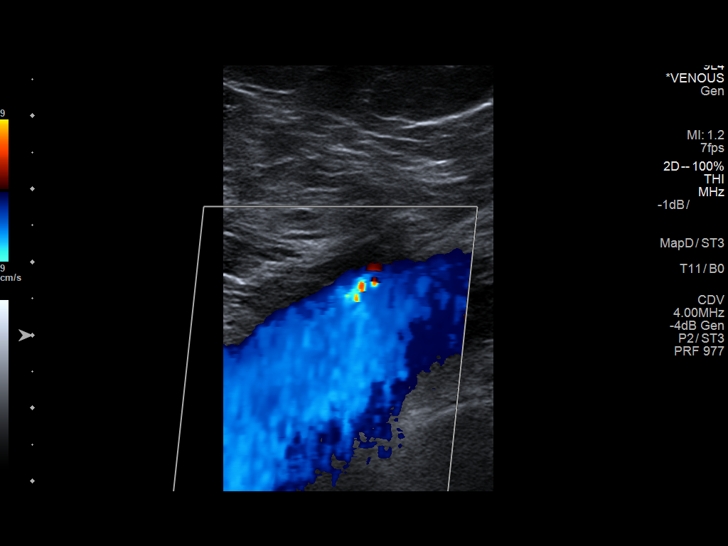
[im 8/31]
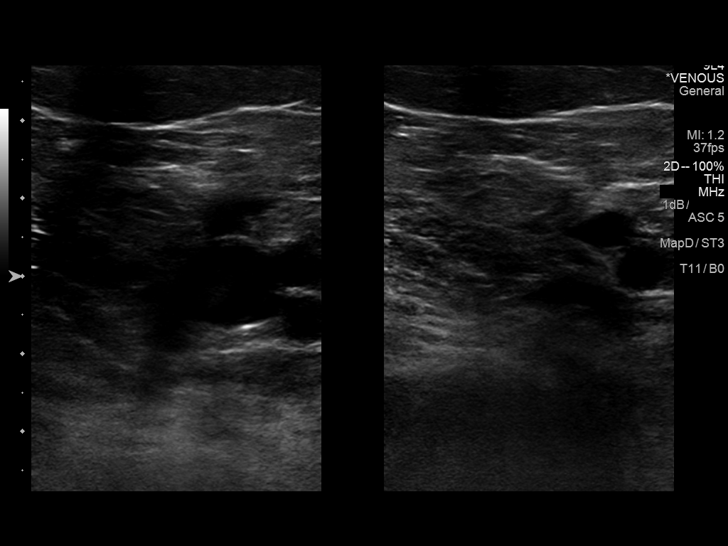
[im 11/31]
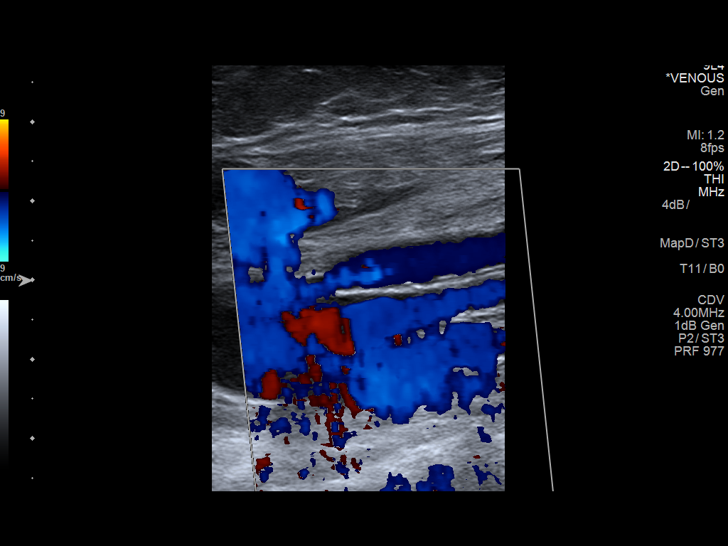
[im 14/31]
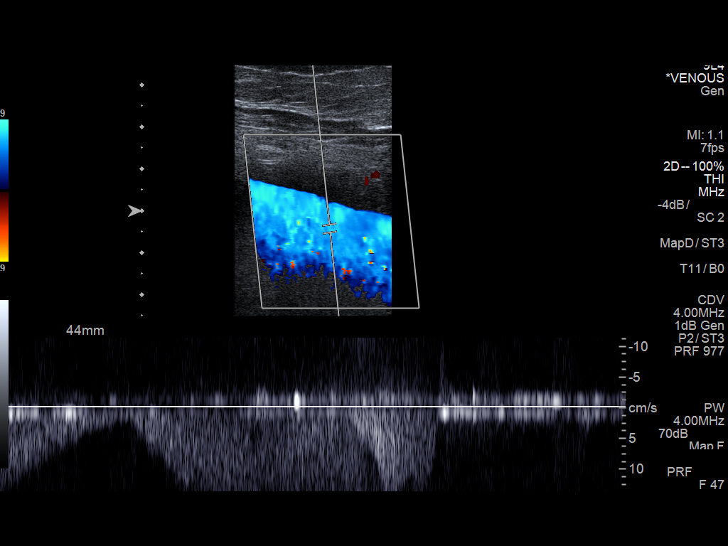
[im 16/31]
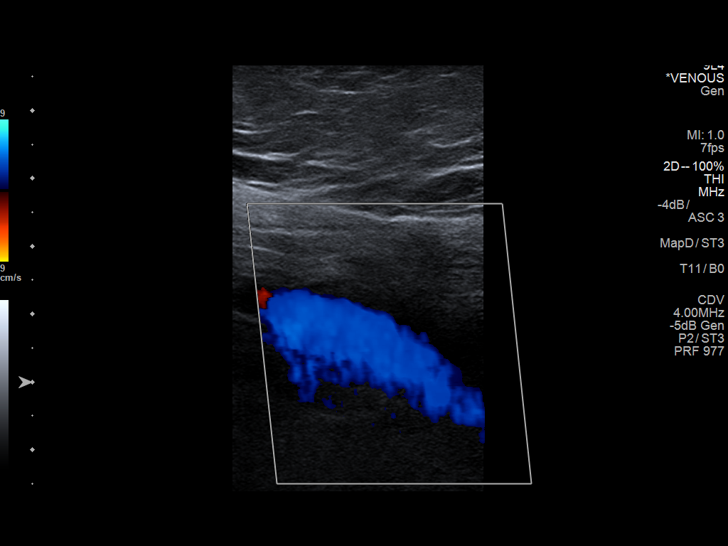
[im 17/31]
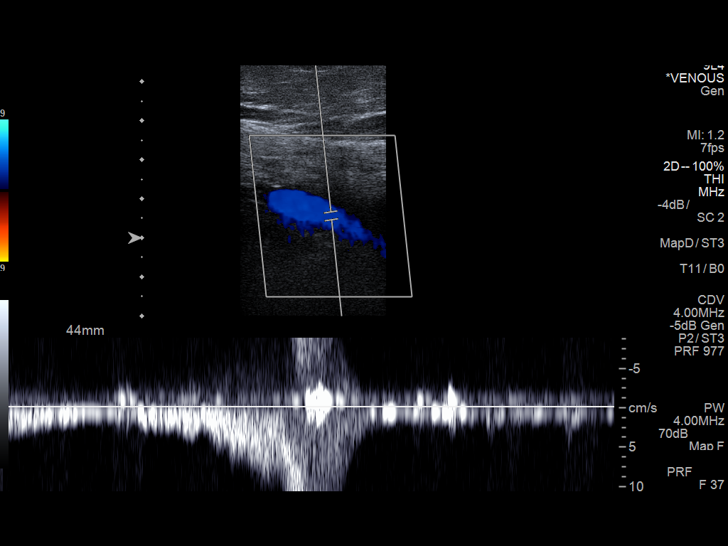
[im 20/31]
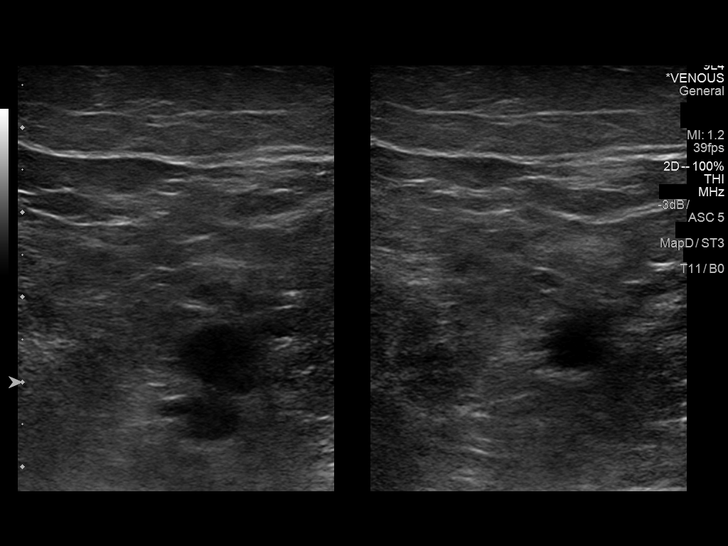
[im 23/31]
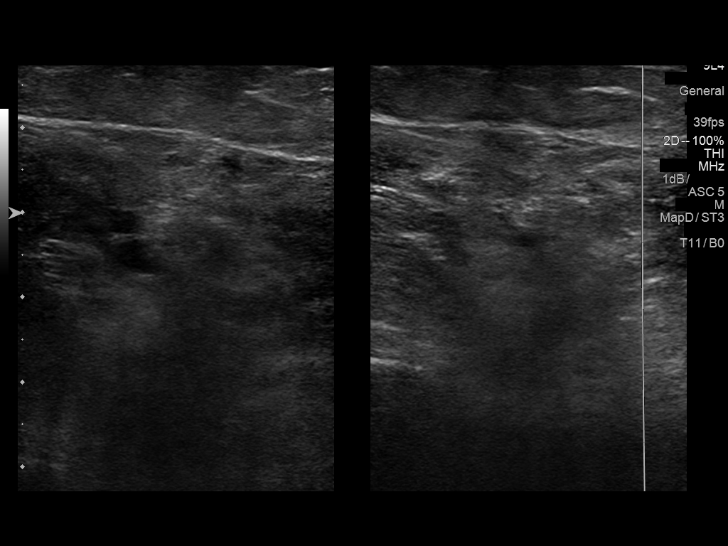
[im 25/31]
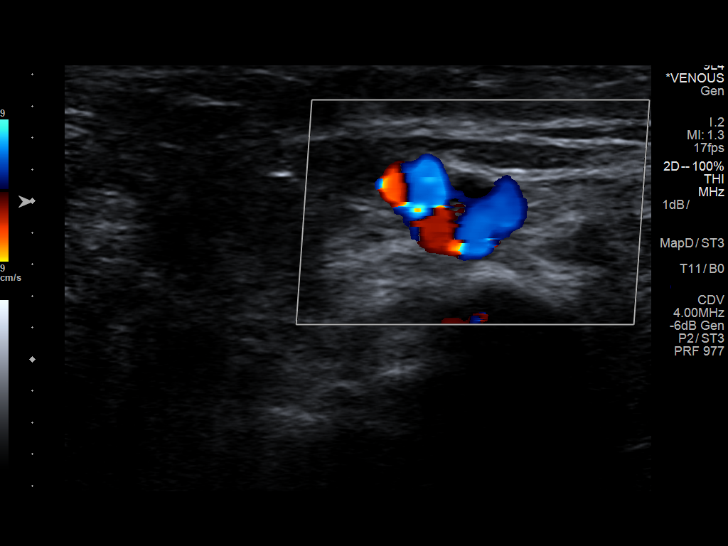
[im 28/31]
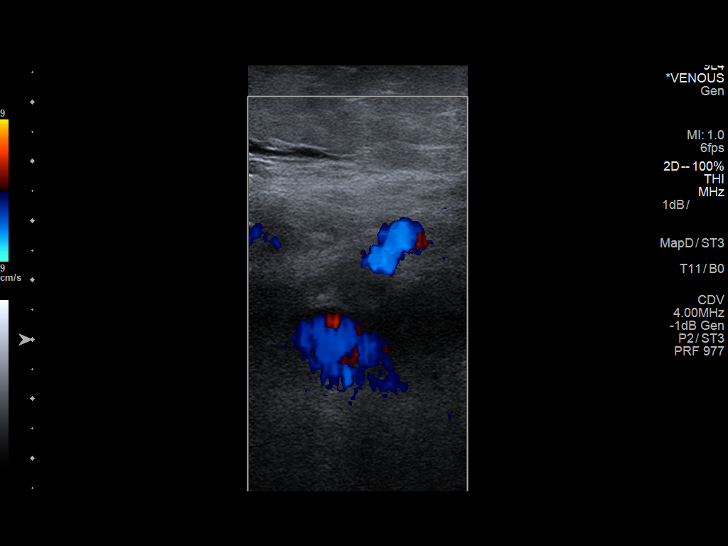
[im 31/31]
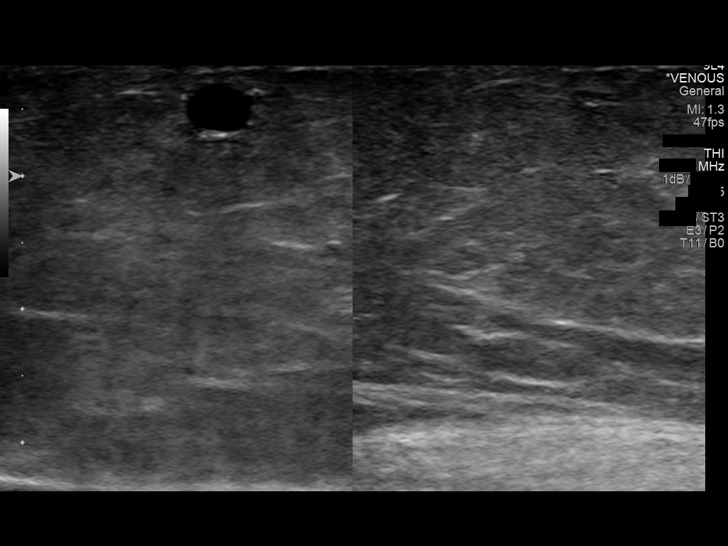

[13 of 24 positions shown; findings below may reference images not displayed]

FINDINGS: Contralateral Common Femoral Vein: Respiratory phasicity is normal
and symmetric with the symptomatic side. No evidence of thrombus.
Normal compressibility.

Common Femoral Vein: No evidence of thrombus. Normal
compressibility, respiratory phasicity and response to augmentation.

Saphenofemoral Junction: No evidence of thrombus. Normal
compressibility and flow on color Doppler imaging.

Profunda Femoral Vein: No evidence of thrombus. Normal
compressibility and flow on color Doppler imaging.

Femoral Vein: No evidence of thrombus. Normal compressibility,
respiratory phasicity and response to augmentation.

Popliteal Vein: No evidence of thrombus. Normal compressibility,
respiratory phasicity and response to augmentation.

Calf Veins: No evidence of thrombus. Normal compressibility and flow
on color Doppler imaging.

Superficial Great Saphenous Vein: No evidence of thrombus. Normal
compressibility.

Venous Reflux:  None.

Other Findings:  None.
IMPRESSION: No evidence of deep venous thrombosis.

## 2018-09-05 ENCOUNTER — Ambulatory Visit (INDEPENDENT_AMBULATORY_CARE_PROVIDER_SITE_OTHER): Payer: No Typology Code available for payment source | Admitting: Physician Assistant

## 2018-09-05 ENCOUNTER — Encounter: Payer: Self-pay | Admitting: Physician Assistant

## 2018-09-05 VITALS — BP 122/79 | HR 76

## 2018-09-05 DIAGNOSIS — E1169 Type 2 diabetes mellitus with other specified complication: Secondary | ICD-10-CM | POA: Diagnosis not present

## 2018-09-05 DIAGNOSIS — Z5181 Encounter for therapeutic drug level monitoring: Secondary | ICD-10-CM | POA: Insufficient documentation

## 2018-09-05 DIAGNOSIS — H9313 Tinnitus, bilateral: Secondary | ICD-10-CM

## 2018-09-05 DIAGNOSIS — E1129 Type 2 diabetes mellitus with other diabetic kidney complication: Secondary | ICD-10-CM

## 2018-09-05 DIAGNOSIS — F330 Major depressive disorder, recurrent, mild: Secondary | ICD-10-CM

## 2018-09-05 DIAGNOSIS — E1159 Type 2 diabetes mellitus with other circulatory complications: Secondary | ICD-10-CM | POA: Diagnosis not present

## 2018-09-05 DIAGNOSIS — E785 Hyperlipidemia, unspecified: Secondary | ICD-10-CM

## 2018-09-05 DIAGNOSIS — G4709 Other insomnia: Secondary | ICD-10-CM

## 2018-09-05 DIAGNOSIS — Z8639 Personal history of other endocrine, nutritional and metabolic disease: Secondary | ICD-10-CM | POA: Insufficient documentation

## 2018-09-05 DIAGNOSIS — R718 Other abnormality of red blood cells: Secondary | ICD-10-CM | POA: Insufficient documentation

## 2018-09-05 DIAGNOSIS — Z862 Personal history of diseases of the blood and blood-forming organs and certain disorders involving the immune mechanism: Secondary | ICD-10-CM | POA: Insufficient documentation

## 2018-09-05 DIAGNOSIS — Z1329 Encounter for screening for other suspected endocrine disorder: Secondary | ICD-10-CM

## 2018-09-05 MED ORDER — SERTRALINE HCL 100 MG PO TABS: 100.0000 mg | ORAL_TABLET | Freq: Every day | ORAL | 1 refills | Status: DC

## 2018-09-05 MED ORDER — ROSUVASTATIN CALCIUM 40 MG PO TABS: 40.0000 mg | ORAL_TABLET | Freq: Every day | ORAL | 1 refills | Status: DC

## 2018-09-05 MED ORDER — AMLODIPINE BESYLATE 5 MG PO TABS: 5.0000 mg | ORAL_TABLET | Freq: Every day | ORAL | 1 refills | Status: DC

## 2018-09-05 MED ORDER — METFORMIN HCL 1000 MG PO TABS: 1000.0000 mg | ORAL_TABLET | Freq: Two times a day (BID) | ORAL | 0 refills | Status: DC

## 2018-09-05 MED ORDER — HUMULIN 70/30 KWIKPEN (70-30) 100 UNIT/ML ~~LOC~~ SUPN: PEN_INJECTOR | SUBCUTANEOUS | 0 refills | Status: DC

## 2018-09-05 MED ORDER — QUINAPRIL-HYDROCHLOROTHIAZIDE 20-12.5 MG PO TABS: 2.0000 | ORAL_TABLET | Freq: Every day | ORAL | 1 refills | Status: DC

## 2018-09-05 MED FILL — ROSUVASTATIN CALCIUM 40 MG: 40 | 90 days supply | Qty: 90 | Fill #0

## 2018-09-05 MED FILL — AMLODIPINE BESYLATE 5 MG TA: 5 | 90 days supply | Qty: 90 | Fill #0

## 2018-09-05 MED FILL — metFORMIN HCL 1000 MG TABS: 1000 | 90 days supply | Qty: 180 | Fill #0

## 2018-09-05 MED FILL — SERTRALINE HCL 100 MG TABS: 100 | 90 days supply | Qty: 90 | Fill #0

## 2018-09-05 NOTE — Progress Notes (Signed)
Virtual Visit via Video Note  I connected with Consolidated EdisonLisa Mansfield [name] on 09/05/18 at  4:20 PM EDT by a video enabled telemedicine application and verified that I am speaking with the correct person using two identifiers. The video quality was poor so we switched to a telephone.   I discussed the limitations of evaluation and management by telemedicine and the availability of in person appointments. The patient expressed understanding and agreed to proceed.  History of Present Illness: HPI:                                                                Jessica [name] is a 63 y.o. female   CC: chronic disease management  DMII: poorly controlled with long-term insulin use. Lab Results  Component Value Date   HGBA1C 9.7 (A) 06/05/2018  Currently taking Novolin 42 units twice a day at breakfast and at supper and Metformin 1000 mg tiwce a day. Trulicity was increased 1.5 mg weekly 3 months ago, but she self-discontinued this due to cost.  Reports diet has not been great due to working 3rd shift. She typically eats breakfast and takes morning insulin at 7:30/8:00am, goes to bed around 1:00pm, wakes up at 8:00pm, eats supper and takes insulin around 8:30pm, goes to work at 11:00pm, snacks throughout her shift until 7:00am. Typical snacks are little debbie muffins and peanut butter crackers Denies polydipsia, polyuria. Denies blurred vision or vision change. Denies extremity pain, altered sensation and paresthesias.  Denies ulcers/wounds on feet. Hx of DKA/HHS: no Blood glucose readings: "like a yo yo," fasting glucose 160's-170's, postprandials 220's Hypoglycemia frequency: she has woken up twice sweating with glucose in the low 70's Severe hypoglycemia (requiring 3rd party assistance): none  HTN: taking Amlodipine in the evening and quinapril-hydrochlorothiazide in the morning.  Compliant with medications. Does not monitor BP's at home. Denies vision change, headache, chest pain with  exertion, orthopnea, lightheadedness, syncope and edema. Risk factors include: DM2, obesity, postmenopausal   Depression/Anxiety: taking Sertraline 100 mg without difficulty.She was previously taking 200 mg and Alprazolam prn, but states her anxiety is much better and she no longer requires Alprazolam. Denies symptoms of mania/hypomania. Denies suicidal thinking. Denies auditory/visual hallucinations. 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 She has been coping as well as she can.  She is working third shift, so sleep schedule has been difficult. She is taking Melatonin 5 mg gummie and this is helping.  Anxiety is still controlled. Mood - "comes and goes," endorses mood lability. She states last weekend was difficult due to personal/financial issues with her family. She states she "sends it to God" and this helps her to cope.   Past Medical History:  Diagnosis Date  . Anemia   . Anxiety   . Arthritis   . Carpal tunnel syndrome   . Chronic headaches   . Depression   . Diabetes mellitus without complication (HCC)   . History of vitamin D deficiency   . Hyperlipidemia   . Hypertension   . Obesity   . Tinnitus aurium, bilateral    Past Surgical History:  Procedure Laterality Date  . APPENDECTOMY    . CARDIAC CATHETERIZATION  Negative. Dx was Anxiety  . HERNIA REPAIR    . LEG SURGERY    . TUBAL LIGATION     Social History   Tobacco Use  . Smoking status: Never Smoker  . Smokeless tobacco: Never Used  Substance Use Topics  . Alcohol use: No    Frequency: Never   family history includes Diabetes in her maternal aunt; Hyperlipidemia in her mother; Hypertension in her mother; Skin cancer in her maternal uncle; Stroke in her father; Thyroid cancer in her sister.    ROS: negative except as noted in the HPI  Medications: Current Outpatient Medications   Medication Sig Dispense Refill  . ferrous sulfate 325 (65 FE) MG tablet Take 65 mg by mouth daily.    . Melatonin 5 MG CHEW Chew 5 mg by mouth at bedtime.    . metFORMIN (GLUCOPHAGE) 1000 MG tablet Take 1 tablet (1,000 mg total) by mouth 2 (two) times daily with a meal. 180 tablet 0  . quinapril-hydrochlorothiazide (ACCURETIC) 20-12.5 MG tablet Take 2 tablets by mouth daily. 180 tablet 1  . rosuvastatin (CRESTOR) 40 MG tablet Take 1 tablet (40 mg total) by mouth at bedtime. 90 tablet 1  . sertraline (ZOLOFT) 100 MG tablet Take 1 tablet (100 mg total) by mouth daily. 90 tablet 1  . Specialty Vitamins Products (MAGNESIUM, AMINO ACID CHELATE,) 133 MG tablet Take by mouth.    Marland Kitchen amLODipine (NORVASC) 5 MG tablet Take 1 tablet (5 mg total) by mouth daily. 90 tablet 1  . aspirin 81 MG tablet Take 81 mg by mouth at bedtime.    Water engineer Bandages & Supports (MEDICAL COMPRESSION STOCKINGS) MISC Knee high medium compression stockings, large circumference, fit to patient's preference 2 each 0  . Insulin Isophane & Regular Human (HUMULIN 70/30 KWIKPEN) (70-30) 100 UNIT/ML PEN Inject 34U SQ with breakfast and 50U SQ with supper. Increase by 1U twice a day every 3 days until fasting glucose 80-130 75 mL 0  . Insulin Pen Needle (PEN NEEDLES) 32G X 4 MM MISC Inject 1 each into the skin 2 (two) times daily. 180 each 1   No current facility-administered medications for this visit.    Allergies  Allergen Reactions  . Aspirin     REACTION: cause bruising       Objective:  BP 122/79   Pulse 76  Gen:  alert, not ill-appearing, no distress, appropriate for age 58: head normocephalic without obvious abnormality, conjunctiva and cornea clear, trachea midline Pulm: Normal work of breathing, normal phonation, clear to auscultation bilaterally, no wheezes, rales or rhonchi CV: Normal rate, regular rhythm, s1 and s2 distinct, no murmurs, clicks or rubs  Neuro: alert and oriented x 3, no tremor MSK:  extremities atraumatic, normal gait and station Skin: intact, no rashes on exposed skin, no jaundice, no cyanosis Psych: well-groomed, cooperative, good eye contact, euthymic mood, affect mood-congruent, speech is articulate, and thought processes clear and goal-directed  BP Readings from Last 3 Encounters:  09/05/18 122/79  06/05/18 (!) 157/96  02/22/18 (!) 142/72   Pulse Readings from Last 3 Encounters:  09/05/18 76  06/05/18 60  02/22/18 77   Wt Readings from Last 3 Encounters:  06/05/18 256 lb (116.1 kg)  02/22/18 259 lb (117.5 kg)  08/03/13 253 lb (114.8 kg)   Lab Results  Component Value Date   HGBA1C 9.7 (A) 06/05/2018    Lab Results  Component Value Date   CREATININE 0.71 06/05/2018   BUN 15 06/05/2018   NA  137 06/05/2018   K 3.8 06/05/2018   CL 98 06/05/2018   CO2 29 06/05/2018   Lab Results  Component Value Date   ALT 15 06/05/2018   AST 17 06/05/2018   BILITOT 0.5 06/05/2018   Lab Results  Component Value Date   CHOL 270 01/25/2008   HDL 43 01/25/2008   LDLCALC 186 01/25/2008   TRIG 203 01/25/2008   Lab Results  Component Value Date   WBC 8.1 09/15/2006   HGB 11.2 09/15/2006   HCT 33.0 09/15/2006   MCV 71.1 09/15/2006   PLT 389 09/15/2006     The 10-year ASCVD risk score Denman George(Goff DC Jr., et al., 2013) is: 6.1%   Values used to calculate the score:     Age: [age] years     Sex: Female     Is Non-Hispanic African American: No     Diabetic: Yes     Tobacco smoker: No     Systolic Blood Pressure: 122 mmHg     Is BP treated: Yes     HDL Cholesterol: 48 MG/DL     Total Cholesterol: 133 MG/DL   No results found for this or any previous visit (from the past 72 hour(s)). No results found.    Assessment and Plan: 63 y.o. female with   .Jessica [name] was seen today for follow-up.  Diagnoses and all orders for this visit:  Type 2 diabetes mellitus with microalbuminuria, with long-term current use of insulin (HCC) -     Insulin Isophane & Regular Human  (HUMULIN 70/30 KWIKPEN) (70-30) 100 UNIT/ML PEN; Inject 34U SQ with breakfast and 50U SQ with supper. Increase by 1U twice a day every 3 days until fasting glucose 80-130 -     metFORMIN (GLUCOPHAGE) 1000 MG tablet; Take 1 tablet (1,000 mg total) by mouth 2 (two) times daily with a meal. -     BASIC METABOLIC PANEL WITH GFR  Hyperlipidemia associated with type 2 diabetes mellitus (HCC) -     rosuvastatin (CRESTOR) 40 MG tablet; Take 1 tablet (40 mg total) by mouth at bedtime. -     Lipid Panel w/reflex Direct LDL  Hypertension associated with diabetes (HCC) -     amLODipine (NORVASC) 5 MG tablet; Take 1 tablet (5 mg total) by mouth daily. -     quinapril-hydrochlorothiazide (ACCURETIC) 20-12.5 MG tablet; Take 2 tablets by mouth daily. -     BASIC METABOLIC PANEL WITH GFR -     TSH + free T4  Mild episode of recurrent major depressive disorder (HCC) -     sertraline (ZOLOFT) 100 MG tablet; Take 1 tablet (100 mg total) by mouth daily.  Encounter for medication monitoring -     CBC -     Lipid Panel w/reflex Direct LDL -     Fe+TIBC+Fer -     BASIC METABOLIC PANEL WITH GFR -     TSH + free T4  Low mean corpuscular volume (MCV) -     CBC -     Fe+TIBC+Fer -     BASIC METABOLIC PANEL WITH GFR  History of iron deficiency anemia -     CBC -     Fe+TIBC+Fer -     BASIC METABOLIC PANEL WITH GFR  Screening for thyroid disorder -     TSH + free T4  Tinnitus aurium, bilateral  History of vitamin D deficiency  Other insomnia   Uncontrolled Type 2 DM with hypoglycemia  Self-discontinued Trulicity due to cost and  not feeling it was effective. Declines to switch to [facility] at [facility] After reviewing home glucose readings and current eating patterns, adjusting her insulin dose to 34U with breakfast and 50U with supper since she is eating majority of her calories in the evening Reviewed self-titration of insulin using teachback method. She will titrate by 2U (1U bid) every 3 days to  FBG goal of 90-130 Discussed choosing healthier snack options that are whole grain, high protein. She deferred a referral to diabetic nutritionist  She will follow-up in office in 1 month for POC A1C, urine microalbumin and fasting labs    Follow Up Instructions:    I discussed the assessment and treatment plan with the patient. The patient was provided an opportunity to ask questions and all were answered. The patient agreed with the plan and demonstrated an understanding of the instructions.   The patient was advised to call back or seek an in-person evaluation if the symptoms worsen or if the condition fails to improve as anticipated.  I provided 25 minutes of non-face-to-face time during this encounter.   Carlis Stableharley Elizabeth Cummings, [place]-C

## 2018-10-01 MED FILL — QUINAPRIL-HCTZ 20-12.5 MG T: 20-12.5 | 90 days supply | Qty: 180 | Fill #0

## 2018-10-04 MED FILL — QUINAPRIL-HCTZ 20-12.5 MG T: 20-12.5 | 30 days supply | Qty: 60 | Fill #0

## 2018-10-12 ENCOUNTER — Other Ambulatory Visit: Payer: Self-pay | Admitting: Physician Assistant

## 2018-10-12 DIAGNOSIS — E1129 Type 2 diabetes mellitus with other diabetic kidney complication: Secondary | ICD-10-CM

## 2018-10-12 MED FILL — HUMULIN 70/30 KWIKPEN: (70-30) 100 | 54 days supply | Qty: 45 | Fill #0

## 2018-11-05 MED FILL — QUINAPRIL-HCTZ 20-12.5 MG T: 20-12.5 | 30 days supply | Qty: 60 | Fill #1

## 2018-12-07 ENCOUNTER — Other Ambulatory Visit: Payer: Self-pay | Admitting: Physician Assistant

## 2018-12-07 DIAGNOSIS — E1129 Type 2 diabetes mellitus with other diabetic kidney complication: Secondary | ICD-10-CM

## 2018-12-07 MED FILL — HUMULIN 70/30 KWIKPEN: (70-30) 100 | 54 days supply | Qty: 45 | Fill #0

## 2018-12-10 MED FILL — QUINAPRIL-HCTZ 20-12.5 MG T: 20-12.5 | 30 days supply | Qty: 60 | Fill #2

## 2019-01-17 MED FILL — QUINAPRIL-HCTZ 20-12.5 MG T: 20-12.5 | 30 days supply | Qty: 60 | Fill #3

## 2019-02-05 ENCOUNTER — Telehealth: Payer: Self-pay | Admitting: Physician Assistant

## 2019-02-05 DIAGNOSIS — E785 Hyperlipidemia, unspecified: Secondary | ICD-10-CM

## 2019-02-05 DIAGNOSIS — Z862 Personal history of diseases of the blood and blood-forming organs and certain disorders involving the immune mechanism: Secondary | ICD-10-CM

## 2019-02-05 DIAGNOSIS — E1159 Type 2 diabetes mellitus with other circulatory complications: Secondary | ICD-10-CM

## 2019-02-05 DIAGNOSIS — I152 Hypertension secondary to endocrine disorders: Secondary | ICD-10-CM

## 2019-02-05 DIAGNOSIS — E559 Vitamin D deficiency, unspecified: Secondary | ICD-10-CM

## 2019-02-05 DIAGNOSIS — E1169 Type 2 diabetes mellitus with other specified complication: Secondary | ICD-10-CM

## 2019-02-05 NOTE — Telephone Encounter (Signed)
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 Patient agreed to virtual visit with Dr. Loni Muse on Friday and needed lab orders put in as patient agreed to do labs before scheduled virtual visit. Please advise.

## 2019-02-05 NOTE — Telephone Encounter (Signed)
Thanks! Added iron levels, all else looks good

## 2019-02-05 NOTE — Telephone Encounter (Signed)
Ordered labs based on chronic medical problems. Patient is aware.

## 2019-02-07 LAB — CBC WITH DIFFERENTIAL/PLATELET
Absolute Monocytes: 428 cells/uL (ref 200–950)
Basophils Absolute: 37 cells/uL (ref 0–200)
Basophils Relative: 0.4 %
Eosinophils Absolute: 530 cells/uL — ABNORMAL HIGH (ref 15–500)
Eosinophils Relative: 5.7 %
HCT: 37.6 % (ref 35.0–45.0)
Hemoglobin: 11.9 g/dL (ref 11.7–15.5)
Lymphs Abs: 1144 cells/uL (ref 850–3900)
MCH: 25.1 pg — ABNORMAL LOW (ref 27.0–33.0)
MCHC: 31.6 g/dL — ABNORMAL LOW (ref 32.0–36.0)
MCV: 79.2 fL — ABNORMAL LOW (ref 80.0–100.0)
MPV: 9.8 fL (ref 7.5–12.5)
Monocytes Relative: 4.6 %
Neutro Abs: 7161 cells/uL (ref 1500–7800)
Neutrophils Relative %: 77 %
Platelets: 350 10*3/uL (ref 140–400)
RBC: 4.75 10*6/uL (ref 3.80–5.10)
RDW: 13.9 % (ref 11.0–15.0)
Total Lymphocyte: 12.3 %
WBC: 9.3 10*3/uL (ref 3.8–10.8)

## 2019-02-07 LAB — COMPLETE METABOLIC PANEL WITH GFR
AG Ratio: 1.3 (calc) (ref 1.0–2.5)
ALT: 17 U/L (ref 6–29)
AST: 19 U/L (ref 10–35)
Albumin: 3.8 g/dL (ref 3.6–5.1)
Alkaline phosphatase (APISO): 78 U/L (ref 37–153)
BUN: 16 mg/dL (ref 7–25)
CO2: 28 mmol/L (ref 20–32)
Calcium: 9.1 mg/dL (ref 8.6–10.4)
Chloride: 100 mmol/L (ref 98–110)
Creat: 0.74 mg/dL (ref 0.50–0.99)
GFR, Est African American: 101 mL/min/{1.73_m2} (ref >=60)
GFR, Est Non African American: 87 mL/min/{1.73_m2} (ref >=60)
Globulin: 3 g/dL (calc) (ref 1.9–3.7)
Glucose, Bld: 173 mg/dL — ABNORMAL HIGH (ref 65–99)
Potassium: 4 mmol/L (ref 3.5–5.3)
Sodium: 140 mmol/L (ref 135–146)
Total Bilirubin: 0.5 mg/dL (ref 0.2–1.2)
Total Protein: 6.8 g/dL (ref 6.1–8.1)

## 2019-02-07 LAB — IRON,TIBC AND FERRITIN PANEL
%SAT: 10 % (calc) — ABNORMAL LOW (ref 16–45)
Ferritin: 17 ng/mL (ref 16–288)
Iron: 39 ug/dL — ABNORMAL LOW (ref 45–160)
TIBC: 402 mcg/dL (calc) (ref 250–450)

## 2019-02-07 LAB — HEMOGLOBIN A1C
Hgb A1c MFr Bld: 9.2 % of total Hgb — ABNORMAL HIGH (ref <5.7)
Mean Plasma Glucose: 217 (calc)
eAG (mmol/L): 12 (calc)

## 2019-02-07 LAB — LIPID PANEL W/REFLEX DIRECT LDL
Cholesterol: 199 mg/dL (ref <200)
HDL: 43 mg/dL — ABNORMAL LOW (ref >=50)
LDL Cholesterol (Calc): 122 mg/dL (calc) — ABNORMAL HIGH
Non-HDL Cholesterol (Calc): 156 mg/dL (calc) — ABNORMAL HIGH (ref <130)
Total CHOL/HDL Ratio: 4.6 (calc) (ref <5.0)
Triglycerides: 219 mg/dL — ABNORMAL HIGH (ref <150)

## 2019-02-07 LAB — VITAMIN D 25 HYDROXY (VIT D DEFICIENCY, FRACTURES): Vit D, 25-Hydroxy: 32 ng/mL (ref 30–100)

## 2019-02-08 ENCOUNTER — Ambulatory Visit (INDEPENDENT_AMBULATORY_CARE_PROVIDER_SITE_OTHER): Payer: No Typology Code available for payment source | Admitting: Osteopathic Medicine

## 2019-02-08 ENCOUNTER — Encounter: Payer: Self-pay | Admitting: Osteopathic Medicine

## 2019-02-08 VITALS — BP 126/72 | HR 58 | Wt 240.0 lb

## 2019-02-08 DIAGNOSIS — R809 Proteinuria, unspecified: Secondary | ICD-10-CM

## 2019-02-08 DIAGNOSIS — E785 Hyperlipidemia, unspecified: Secondary | ICD-10-CM

## 2019-02-08 DIAGNOSIS — F330 Major depressive disorder, recurrent, mild: Secondary | ICD-10-CM

## 2019-02-08 DIAGNOSIS — E1159 Type 2 diabetes mellitus with other circulatory complications: Secondary | ICD-10-CM

## 2019-02-08 DIAGNOSIS — E1129 Type 2 diabetes mellitus with other diabetic kidney complication: Secondary | ICD-10-CM

## 2019-02-08 DIAGNOSIS — E1169 Type 2 diabetes mellitus with other specified complication: Secondary | ICD-10-CM | POA: Diagnosis not present

## 2019-02-08 DIAGNOSIS — Z794 Long term (current) use of insulin: Secondary | ICD-10-CM

## 2019-02-08 DIAGNOSIS — I10 Essential (primary) hypertension: Secondary | ICD-10-CM

## 2019-02-08 DIAGNOSIS — D508 Other iron deficiency anemias: Secondary | ICD-10-CM | POA: Diagnosis not present

## 2019-02-08 MED ORDER — METFORMIN HCL 1000 MG PO TABS: 1000.0000 mg | ORAL_TABLET | Freq: Two times a day (BID) | ORAL | 3 refills | Status: DC

## 2019-02-08 MED ORDER — FERROUS SULFATE 325 (65 FE) MG PO TABS: 325.0000 mg | ORAL_TABLET | Freq: Two times a day (BID) | ORAL | 3 refills | Status: DC

## 2019-02-08 MED ORDER — AMLODIPINE BESYLATE 5 MG PO TABS: 5.0000 mg | ORAL_TABLET | Freq: Every day | ORAL | 1 refills | Status: DC

## 2019-02-08 MED ORDER — TRESIBA FLEXTOUCH 200 UNIT/ML ~~LOC~~ SOPN: 50.0000 [IU] | PEN_INJECTOR | Freq: Every day | SUBCUTANEOUS | 11 refills | Status: DC

## 2019-02-08 MED ORDER — SERTRALINE HCL 100 MG PO TABS: 100.0000 mg | ORAL_TABLET | Freq: Every day | ORAL | 1 refills | Status: DC

## 2019-02-08 MED ORDER — ROSUVASTATIN CALCIUM 40 MG PO TABS: 40.0000 mg | ORAL_TABLET | Freq: Every day | ORAL | 3 refills | Status: DC

## 2019-02-08 MED ORDER — QUINAPRIL-HYDROCHLOROTHIAZIDE 20-12.5 MG PO TABS: 2.0000 | ORAL_TABLET | Freq: Every day | ORAL | 3 refills | Status: DC

## 2019-02-08 MED ORDER — HUMULIN 70/30 KWIKPEN (70-30) 100 UNIT/ML ~~LOC~~ SUPN: 30.0000 [IU] | PEN_INJECTOR | Freq: Two times a day (BID) | SUBCUTANEOUS | 0 refills | Status: DC

## 2019-02-08 MED FILL — TRESIBA FLEXTOUCH 200 UNITS: 200 | 24 days supply | Qty: 15 | Fill #0

## 2019-02-08 MED FILL — metFORMIN HCL 1000 MG TABS: 1000 | 90 days supply | Qty: 180 | Fill #0

## 2019-02-08 MED FILL — AMLODIPINE BESYLATE 5 MG TA: 5 | 90 days supply | Qty: 90 | Fill #0

## 2019-02-08 MED FILL — ROSUVASTATIN CALCIUM 40 MG: 40 | 90 days supply | Qty: 90 | Fill #0

## 2019-02-08 MED FILL — SERTRALINE HCL 100 MG TABS: 100 | 90 days supply | Qty: 90 | Fill #0

## 2019-02-08 MED FILL — FERROUS SULFATE 325 MG TAB: 325 (65 FE) | 100 days supply | Qty: 200 | Fill #0

## 2019-02-08 MED FILL — HUMULIN 70/30 KWIKPEN: (70-30) 100 | 45 days supply | Qty: 45 | Fill #0

## 2019-02-08 NOTE — Patient Instructions (Addendum)
For Diabetes  Continue Metformin as you are taking it  Measure fasting blood sugar every day whenever you wake up: goal for now is to get this to 80-130  Start at 50 units [place] when you get it. Take this at bedtime, whenever that is for you. This insulin lasts a long time in your system, so taking every day around same time should not be needed.   STOP the Humulin 70/30 (take last bedtime dose then the next day skip the morning dose and start the [place] that bedtime)  Increase daily Tresiba dose by 3 units at a time, twice per week, until fasting sugars are consistently 90-130, then continue at that dose  Plan to recheck A1C in 3 months  Plan to follow-up in the office sooner if you experience low sugars   Plan to follow-up in the office sooner if your fasting sugars are consistently high  Bring all sugar readings with you to your office visits   For cholesterol You are on the maximum dose of rosuvastatin, but LDL/bad cholesterol is not quite to our goal of 70 or less.  To get these numbers looking better and reduce your long-term risk of heart attack/stroke, please limit fats in your diet and exercise as you are able.  If cholesterol is not looking better, we may consider changing or adding a medication  For iron levels Please take the iron pills twice daily, if this causes constipation can take every other day alternating between twice daily and once daily.   HIPAA PROTECTED HEALTH INFORMATION WARNING: Please be aware that email communication can be intercepted in transmission or misdirected. Your use of email to communicate protected health information to me indicates you acknowledge and accept the possible risks associated with such communication. Please consider communicating very sensitive information by phone to the office, by fax or by mail/postal service.

## 2019-02-08 NOTE — Progress Notes (Signed)
Virtual Visit via Video (App used: DOXIMITY) Note  I connected with      [employer] on 02/08/19 at 11:03 AM by a telemedicine application and verified that I am speaking with the correct person using two identifiers.  Patient is AT HOME  I am in office   I discussed the limitations of evaluation and management by telemedicine and the availability of in person appointments. The patient expressed understanding and agreed to proceed.  History of Present Illness: Jessica [name] is a 63 y.o. female who would like to discuss DIABETES, LABS   Diabetes: Taking insulin 70/30, 30 units when she wakes up, 50 units before bedtime.  She changes shifts a lot, so is inconsistent with insulin dosing time.  Cholesterol: LDL above goal, patient is on max of rosuvastatin.  Gets very little in the way of exercise other than watching her grandkids.  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        Observations/Objective: BP 126/72 (Patient Position: Sitting, Cuff Size: Normal)   Pulse (!) 58   Wt 240 lb (108.9 kg)   BMI 48.47 kg/m  BP Readings from Last 3 Encounters:  02/08/19 126/72  09/05/18 122/79  06/05/18 (!) 157/96   Exam: Normal Speech.  NAD  Lab and Radiology Results Results for orders placed or performed in visit on 02/05/19 (from the past 72 hour(s))  CBC w/Diff/Platelet     Status: Abnormal   Collection Time: 02/06/19  9:27 AM  Result Value Ref Range   WBC 9.3 3.8 - 10.8 Thousand/uL   RBC 4.75 3.80 - 5.10 Million/uL   Hemoglobin 11.9 11.7 - 15.5 g/dL   HCT 16.1 09.6 - 04.5 %   MCV 79.2 (L) 80.0 - 100.0 fL   MCH 25.1 (L) 27.0 - 33.0 pg   MCHC 31.6 (L) 32.0 - 36.0 g/dL   RDW 40.9 81.1 - 91.4 %   Platelets 350 140 - 400 Thousand/uL   MPV 9.8 7.5 - 12.5 fL   Neutro Abs 7,161 1,500 - 7,800 cells/uL   Lymphs Abs 1,144 850 - 3,900 cells/uL   Absolute Monocytes 428 200 - 950 cells/uL   Eosinophils  Absolute 530 (H) 15 - 500 cells/uL   Basophils Absolute 37 0 - 200 cells/uL   Neutrophils Relative % 77 %   Total Lymphocyte 12.3 %   Monocytes Relative 4.6 %   Eosinophils Relative 5.7 %   Basophils Relative 0.4 %  COMPLETE METABOLIC PANEL WITH GFR     Status: Abnormal   Collection Time: 02/06/19  9:27 AM  Result Value Ref Range   Glucose, Bld 173 (H) 65 - 99 mg/dL    Comment: .            Fasting reference interval . For someone without known diabetes, a glucose value >125 mg/dL indicates that they may have diabetes and this should be confirmed with a follow-up test. .    BUN 16 7 - 25 mg/dL   Creat 7.82 9.56 - 2.13 mg/dL    Comment: For patients >51 years of age, the reference limit for Creatinine is approximately 13% higher for people identified as African-American. .    GFR, Est Non African American 87 > OR = 60 mL/min/1.59m2   GFR, Est African American 101 > OR = 60 mL/min/1.66m2   BUN/Creatinine Ratio NOT APPLICABLE 6 - 22 (calc)   Sodium 140 135 - 146 mmol/L  Potassium 4.0 3.5 - 5.3 mmol/L   Chloride 100 98 - 110 mmol/L   CO2 28 20 - 32 mmol/L   Calcium 9.1 8.6 - 10.4 mg/dL   Total Protein 6.8 6.1 - 8.1 g/dL   Albumin 3.8 3.6 - 5.1 g/dL   Globulin 3.0 1.9 - 3.7 g/dL (calc)   AG Ratio 1.3 1.0 - 2.5 (calc)   Total Bilirubin 0.5 0.2 - 1.2 mg/dL   Alkaline phosphatase (APISO) 78 37 - 153 U/L   AST 19 10 - 35 U/L   ALT 17 6 - 29 U/L  Lipid Panel w/reflex Direct LDL     Status: Abnormal   Collection Time: 02/06/19  9:27 AM  Result Value Ref Range   Cholesterol 199 <200 mg/dL   HDL 43 (L) > OR = 50 mg/dL   Triglycerides 219 (H) <150 mg/dL    Comment: . If a non-fasting specimen was collected, consider repeat triglyceride testing on a fasting specimen if clinically indicated.  Yates Decamp et al. J. of Clin. Lipidol. 7425;9:563-875. Marland Kitchen    LDL Cholesterol (Calc) 122 (H) mg/dL (calc)    Comment: Reference range: <100 . Desirable range <100 mg/dL for primary  prevention;   <70 mg/dL for patients with CHD or diabetic patients  with > or = 2 CHD risk factors. Marland Kitchen LDL-C is now calculated using the Martin-Hopkins  calculation, which is a validated novel method providing  better accuracy than the Friedewald equation in the  estimation of LDL-C.  Cresenciano Genre et al. Annamaria Helling. 6433;295(18): 2061-2068  (http://education.[facility].com/faq/FAQ164)    Total CHOL/HDL Ratio 4.6 <5.0 (calc)   Non-HDL Cholesterol (Calc) 156 (H) <130 mg/dL (calc)    Comment: For patients with diabetes plus 1 major ASCVD risk  factor, treating to a non-HDL-C goal of <100 mg/dL  (LDL-C of <70 mg/dL) is considered a therapeutic  option.   HgB A1c     Status: Abnormal   Collection Time: 02/06/19  9:27 AM  Result Value Ref Range   Hgb A1c MFr Bld 9.2 (H) <5.7 % of total Hgb    Comment: For someone without known diabetes, a hemoglobin A1c value of 6.5% or greater indicates that they may have  diabetes and this should be confirmed with a follow-up  test. . For someone with known diabetes, a value <7% indicates  that their diabetes is well controlled and a value  greater than or equal to 7% indicates suboptimal  control. A1c targets should be individualized based on  duration of diabetes, age, comorbid conditions, and  other considerations. . Currently, no consensus exists regarding use of hemoglobin A1c for diagnosis of diabetes for children. .    Mean Plasma Glucose 217 (calc)   eAG (mmol/L) 12.0 (calc)  Vitamin D (25 hydroxy)     Status: None   Collection Time: 02/06/19  9:27 AM  Result Value Ref Range   Vit D, 25-Hydroxy 32 30 - 100 ng/mL    Comment: Vitamin D Status         25-OH Vitamin D: . Deficiency:                    <20 ng/mL Insufficiency:             20 - 29 ng/mL Optimal:                 > or = 30 ng/mL . For 25-OH Vitamin D testing on patients on  D2-supplementation and patients for whom quantitation  of D2 and D3 fractions is required, the  [facility](TM) 25-OH VIT D, (D2,D3), LC/MS/MS is recommended: order  code 75643 (patients >49yrs). See Note 1 . Note 1 . For additional information, please refer to  http://education.[facility].com/faq/FAQ199  (This link is being provided for informational/ educational purposes only.)   Fe+TIBC+Fer     Status: Abnormal   Collection Time: 02/06/19  9:32 AM  Result Value Ref Range   Iron 39 (L) 45 - 160 mcg/dL   TIBC 329 518 - 841 mcg/dL (calc)   %SAT 10 (L) 16 - 45 % (calc)   Ferritin 17 16 - 288 ng/mL   No results found.     Assessment and Plan: 63 y.o. female with The primary encounter diagnosis was Other iron deficiency anemia. Diagnoses of Type 2 diabetes mellitus with microalbuminuria, with long-term current use of insulin (HCC), Hypertension associated with diabetes (HCC), Hyperlipidemia associated with type 2 diabetes mellitus (HCC), and Mild episode of recurrent major depressive disorder (HCC) were also pertinent to this visit.  A1c above goal, patient does have insurance, will work on getting her onto a long-acting insulin and discontinuing the 70/30 mix if possible.  See patient instructions below  PDMP not reviewed this encounter. Orders Placed This Encounter  Procedures  . CBC  . A1C  . LIPID SCREENING  . Fe+TIBC+Fer   Meds ordered this encounter  Medications  . Insulin Isophane & Regular Human (HUMULIN 70/30 KWIKPEN) (70-30) 100 UNIT/ML PEN    Sig: Inject 30-50 Units into the skin 2 (two) times daily. As directed (will be hopefully switching to [place])    Dispense:  45 mL    Refill:  0  . quinapril-hydrochlorothiazide (ACCURETIC) 20-12.5 MG tablet    Sig: Take 2 tablets by mouth daily.    Dispense:  180 tablet    Refill:  3  . rosuvastatin (CRESTOR) 40 MG tablet    Sig: Take 1 tablet (40 mg total) by mouth at bedtime.    Dispense:  90 tablet    Refill:  3  . ferrous sulfate 325 (65 FE) MG tablet    Sig: Take 1 tablet (325 mg total) by mouth 2  (two) times daily with a meal.    Dispense:  180 tablet    Refill:  3  . amLODipine (NORVASC) 5 MG tablet    Sig: Take 1 tablet (5 mg total) by mouth daily.    Dispense:  90 tablet    Refill:  1  . metFORMIN (GLUCOPHAGE) 1000 MG tablet    Sig: Take 1 tablet (1,000 mg total) by mouth 2 (two) times daily with a meal.    Dispense:  180 tablet    Refill:  3  . sertraline (ZOLOFT) 100 MG tablet    Sig: Take 1 tablet (100 mg total) by mouth daily.    Dispense:  90 tablet    Refill:  1  . Insulin Degludec (TRESIBA FLEXTOUCH) 200 UNIT/ML SOPN    Sig: Inject 50-80 Units into the skin at bedtime. Increase by 2 units every 3 days until FBS consistently 90-130 then stay at that dose.    Dispense:  15 mL    Refill:  11   Patient Instructions  For Diabetes  Continue Metformin as you are taking it  Measure fasting blood sugar every day whenever you wake up: goal for now is to get this to 80-130  Start at 50 units [place] when you get it. Take this at bedtime, whenever that is for  you. This insulin lasts a long time in your system, so taking every day around same time should not be needed.   STOP the Humulin 70/30 (take last bedtime dose then the next day skip the morning dose and start the [place]iba that bedtime)  Increase daily Tresiba dose by 3 units at a time, twice per week, until fasting sugars are consistently 90-130, then continue at that dose  Plan to recheck A1C in 3 months  Plan to follow-up in the office sooner if you experience low sugars   Plan to follow-up in the office sooner if your fasting sugars are consistently high  Bring all sugar readings with you to your office visits   For cholesterol You are on the maximum dose of rosuvastatin, but LDL/bad cholesterol is not quite to our goal of 70 or less.  To get these numbers looking better and reduce your long-term risk of heart attack/stroke, please limit fats in your diet and exercise as you are able.  If cholesterol is not  looking better, we may consider changing or adding a medication  For iron levels Please take the iron pills twice daily, if this causes constipation can take every other day alternating between twice daily and once daily.   HIPAA PROTECTED HEALTH INFORMATION WARNING: Please be aware that email communication can be intercepted in transmission or misdirected. Your use of email to communicate protected health information to me indicates you acknowledge and accept the possible risks associated with such communication. Please consider communicating very sensitive information by phone to the office, by fax or by mail/postal service.      Instructions sent via MyChart. If MyChart not available, pt was given option for info via personal e-mail w/ no guarantee of protected health info over unsecured e-mail communication, and MyChart sign-up instructions were sent to patient.   Follow Up Instructions: Return in about 3 months (around 05/11/2019) for RECHECK DIABETES, CHOELSTEROL, IRON LEVELS (get labs prior to visit, orders are in).    I discussed the assessment and treatment plan with the patient. The patient was provided an opportunity to ask questions and all were answered. The patient agreed with the plan and demonstrated an understanding of the instructions.   The patient was advised to call back or seek an in-person evaluation if any new concerns, if symptoms worsen or if the condition fails to improve as anticipated.  30 minutes of non-face-to-face time was provided during this encounter.      . . . . . . . . . . . . . Marland Kitchen.                   Historical information moved to improve visibility of documentation.  Past Medical History:  Diagnosis Date  . Anemia   . Anxiety   . Arthritis   . Carpal tunnel syndrome   . Chronic headaches   . Depression   . Diabetes mellitus without complication (HCC)   . History of vitamin D deficiency   . Hyperlipidemia   .  Hypertension   . Obesity   . 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 Tinnitus aurium, bilateral    Past Surgical History:  Procedure Laterality Date  . APPENDECTOMY    . CARDIAC CATHETERIZATION     Negative. Dx was Anxiety  . HERNIA REPAIR    . LEG SURGERY    . TUBAL LIGATION  Social History   Tobacco Use  . Smoking status: Never Smoker  . Smokeless tobacco: Never Used  Substance Use Topics  . Alcohol use: No    Frequency: Never   family history includes Diabetes in her maternal aunt; Hyperlipidemia in her mother; Hypertension in her mother; Skin cancer in her maternal uncle; Stroke in her father; Thyroid cancer in her sister.  Medications: Current Outpatient Medications  Medication Sig Dispense Refill  . amLODipine (NORVASC) 5 MG tablet Take 1 tablet (5 mg total) by mouth daily. 90 tablet 1  . aspirin 81 MG tablet Take 81 mg by mouth at bedtime.    Clinical research associate Bandages & Supports (MEDICAL COMPRESSION STOCKINGS) MISC Knee high medium compression stockings, large circumference, fit to patient's preference 2 each 0  . ferrous sulfate 325 (65 FE) MG tablet Take 1 tablet (325 mg total) by mouth 2 (two) times daily with a meal. 180 tablet 3  . Insulin Isophane & Regular Human (HUMULIN 70/30 KWIKPEN) (70-30) 100 UNIT/ML PEN Inject 30-50 Units into the skin 2 (two) times daily. As directed (will be hopefully switching to [place]) 45 mL 0  . Insulin Pen Needle (PEN NEEDLES) 32G X 4 MM MISC Inject 1 each into the skin 2 (two) times daily. 180 each 1  . Melatonin 5 MG CHEW Chew 5 mg by mouth at bedtime.    . metFORMIN (GLUCOPHAGE) 1000 MG tablet Take 1 tablet (1,000 mg total) by mouth 2 (two) times daily with a meal. 180 tablet 3  . quinapril-hydrochlorothiazide (ACCURETIC) 20-12.5 MG tablet Take 2 tablets by mouth daily. 180 tablet 3  . rosuvastatin (CRESTOR) 40 MG tablet Take 1 tablet (40 mg total) by mouth  at bedtime. 90 tablet 3  . sertraline (ZOLOFT) 100 MG tablet Take 1 tablet (100 mg total) by mouth daily. 90 tablet 1  . Specialty Vitamins Products (MAGNESIUM, AMINO ACID CHELATE,) 133 MG tablet Take by mouth.    . Insulin Degludec (TRESIBA FLEXTOUCH) 200 UNIT/ML SOPN Inject 50-80 Units into the skin at bedtime. Increase by 2 units every 3 days until FBS consistently 90-130 then stay at that dose. 15 mL 11   No current facility-administered medications for this visit.    Allergies  Allergen Reactions  . Aspirin     REACTION: cause bruising

## 2019-02-22 MED FILL — metFORMIN HCL 1000 MG TABS: 1000 | 90 days supply | Qty: 180 | Fill #0

## 2019-02-22 MED FILL — QUINAPRIL-HCTZ 20-12.5 MG T: 20-12.5 | 90 days supply | Qty: 180 | Fill #0

## 2019-02-22 MED FILL — SERTRALINE HCL 100 MG TABS: 100 | 90 days supply | Qty: 90 | Fill #0

## 2019-02-26 ENCOUNTER — Other Ambulatory Visit: Payer: Self-pay

## 2019-02-26 DIAGNOSIS — Z20822 Contact with and (suspected) exposure to covid-19: Secondary | ICD-10-CM

## 2019-02-28 LAB — NOVEL CORONAVIRUS, NAA: SARS-CoV-2, NAA: NOT DETECTED

## 2019-03-02 ENCOUNTER — Telehealth: Payer: Self-pay | Admitting: General Practice

## 2019-03-02 NOTE — Telephone Encounter (Signed)
Negative COVID results given. Patient results "NOT Detected." Caller expressed understanding. ° °

## 2019-04-11 MED FILL — TRESIBA FLEXTOUCH 200 UNITS: 200 | 24 days supply | Qty: 15 | Fill #1

## 2019-05-27 MED FILL — metFORMIN HCL 1000 MG TABS: 1000 | 90 days supply | Qty: 180 | Fill #1

## 2019-05-27 MED FILL — TRESIBA FLEXTOUCH 200 UNITS: 200 | 24 days supply | Qty: 15 | Fill #2

## 2019-06-10 ENCOUNTER — Telehealth: Payer: Self-pay | Admitting: Medical-Surgical

## 2019-06-10 NOTE — Telephone Encounter (Signed)
Patient overdue for pap according to our records. Please contact to see if she has had this done in the last 3 years. If so, please respond with date and provider that completed it. If not, please offer to schedule appointment for pap smear to be done. 

## 2019-06-10 NOTE — Telephone Encounter (Signed)
Left pt msg advising she is due for DM follow up and also asked her to contact office regarding PAP

## 2019-06-12 NOTE — Telephone Encounter (Signed)
LVMTRC (2nd attempt)  

## 2019-06-13 MED FILL — QUINAPRIL-HCTZ 20-12.5 MG T: 20-12.5 | 90 days supply | Qty: 180 | Fill #1

## 2019-06-26 NOTE — Telephone Encounter (Signed)
PAP declined in patient's health maint

## 2019-06-26 NOTE — Telephone Encounter (Signed)
Pt declines pap at this time.  Pt scheduled for f/u for DM on 07/05/2019.  Pt aware that she needs FBW prior to appt.  Tiajuana Amass, CMA

## 2019-07-04 LAB — CBC
HCT: 36.9 % (ref 35.0–45.0)
Hemoglobin: 11.8 g/dL (ref 11.7–15.5)
MCH: 24.7 pg — ABNORMAL LOW (ref 27.0–33.0)
MCHC: 32 g/dL (ref 32.0–36.0)
MCV: 77.2 fL — ABNORMAL LOW (ref 80.0–100.0)
MPV: 9.4 fL (ref 7.5–12.5)
Platelets: 380 10*3/uL (ref 140–400)
RBC: 4.78 10*6/uL (ref 3.80–5.10)
RDW: 14 % (ref 11.0–15.0)
WBC: 9.2 10*3/uL (ref 3.8–10.8)

## 2019-07-04 LAB — LIPID PANEL
Cholesterol: 143 mg/dL (ref <200)
HDL: 47 mg/dL — ABNORMAL LOW (ref >=50)
LDL Cholesterol (Calc): 70 mg/dL (calc)
Non-HDL Cholesterol (Calc): 96 mg/dL (calc) (ref <130)
Total CHOL/HDL Ratio: 3 (calc) (ref <5.0)
Triglycerides: 184 mg/dL — ABNORMAL HIGH (ref <150)

## 2019-07-04 LAB — HEMOGLOBIN A1C
Hgb A1c MFr Bld: 9.3 % of total Hgb — ABNORMAL HIGH (ref <5.7)
Mean Plasma Glucose: 220 (calc)
eAG (mmol/L): 12.2 (calc)

## 2019-07-04 LAB — IRON,TIBC AND FERRITIN PANEL
%SAT: 8 % (calc) — ABNORMAL LOW (ref 16–45)
Ferritin: 18 ng/mL (ref 16–288)
Iron: 34 ug/dL — ABNORMAL LOW (ref 45–160)
TIBC: 403 mcg/dL (calc) (ref 250–450)

## 2019-07-05 ENCOUNTER — Other Ambulatory Visit: Payer: Self-pay

## 2019-07-05 ENCOUNTER — Encounter: Payer: Self-pay | Admitting: Osteopathic Medicine

## 2019-07-05 ENCOUNTER — Other Ambulatory Visit: Payer: Self-pay | Admitting: Osteopathic Medicine

## 2019-07-05 ENCOUNTER — Ambulatory Visit (INDEPENDENT_AMBULATORY_CARE_PROVIDER_SITE_OTHER): Payer: No Typology Code available for payment source

## 2019-07-05 ENCOUNTER — Ambulatory Visit (INDEPENDENT_AMBULATORY_CARE_PROVIDER_SITE_OTHER): Payer: No Typology Code available for payment source | Admitting: Osteopathic Medicine

## 2019-07-05 VITALS — BP 111/75 | HR 61 | Wt 238.0 lb

## 2019-07-05 DIAGNOSIS — M67972 Unspecified disorder of synovium and tendon, left ankle and foot: Secondary | ICD-10-CM

## 2019-07-05 DIAGNOSIS — E1129 Type 2 diabetes mellitus with other diabetic kidney complication: Secondary | ICD-10-CM

## 2019-07-05 DIAGNOSIS — R21 Rash and other nonspecific skin eruption: Secondary | ICD-10-CM

## 2019-07-05 DIAGNOSIS — G8929 Other chronic pain: Secondary | ICD-10-CM | POA: Insufficient documentation

## 2019-07-05 DIAGNOSIS — R809 Proteinuria, unspecified: Secondary | ICD-10-CM | POA: Diagnosis not present

## 2019-07-05 DIAGNOSIS — Z794 Long term (current) use of insulin: Secondary | ICD-10-CM

## 2019-07-05 IMAGING — DX DG OS CALCIS 2+V*L*
2 series · 2 of 2 positions shown · non-contrast
Comparison: None.

CLINICAL DATA: Pain over Achilles insertion of posterior heel 1-2
months.

EXAM:
LEFT OS CALCIS - 2+ VIEW

[calcaneus axial]
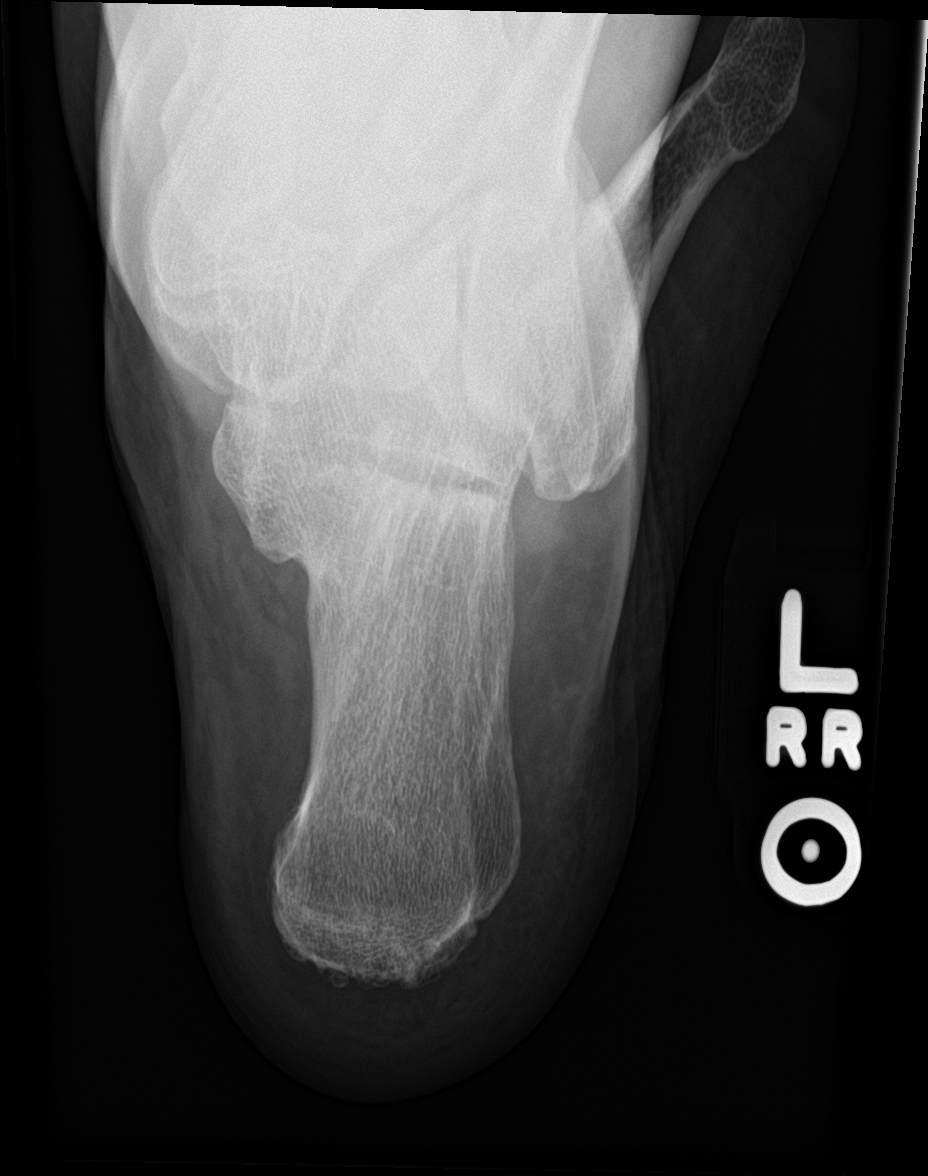

[calcaneus lat]
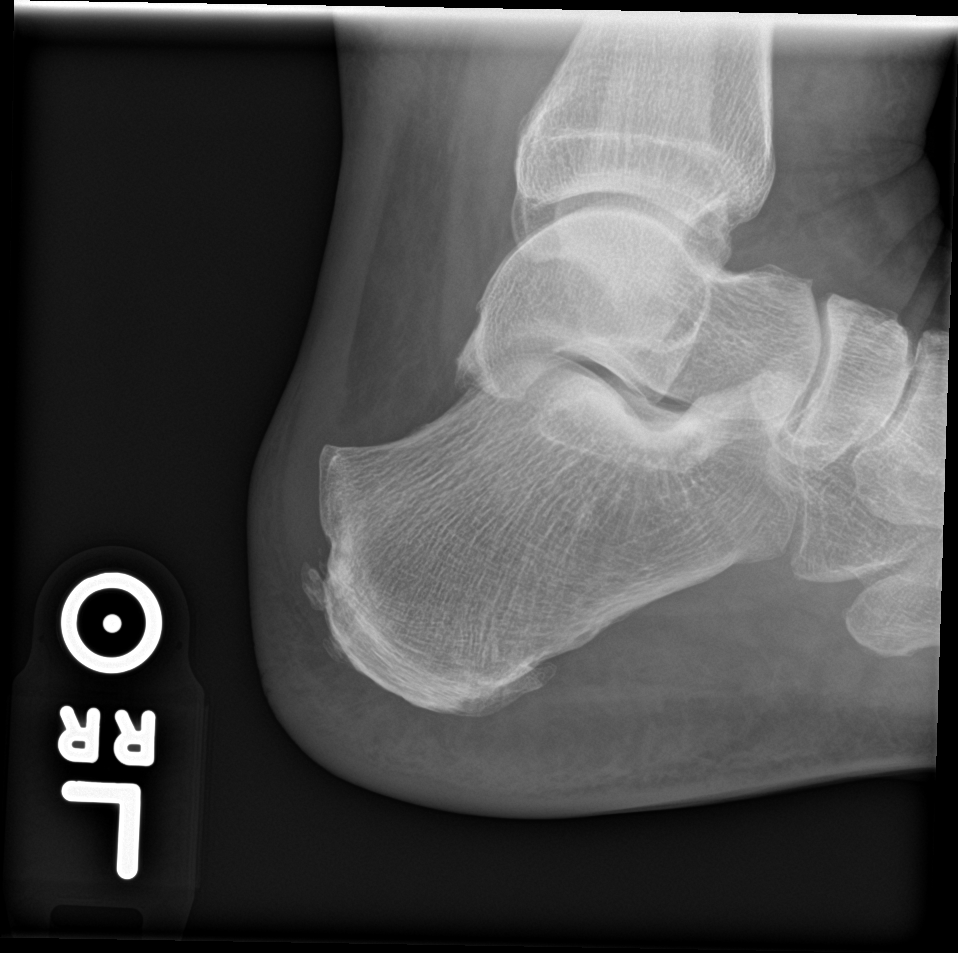

[2 of 2 positions shown; findings below may reference images not displayed]

FINDINGS: Minimal spurring over the posterior and inferior calcaneus. No
fracture or dislocation. No significant soft tissue abnormality.
IMPRESSION: No acute findings.

## 2019-07-05 MED ORDER — OZEMPIC (0.25 OR 0.5 MG/DOSE) 2 MG/1.5ML ~~LOC~~ SOPN: 0.5000 mg | PEN_INJECTOR | SUBCUTANEOUS | 3 refills | Status: DC

## 2019-07-05 MED ORDER — MELOXICAM 15 MG PO TABS: ORAL_TABLET | ORAL | 3 refills | Status: DC

## 2019-07-05 MED ORDER — CLOTRIMAZOLE 1 % EX CREA: 1.0000 "application " | TOPICAL_CREAM | Freq: Two times a day (BID) | CUTANEOUS | 0 refills | Status: DC

## 2019-07-05 MED FILL — ANTIFUNGAL CLOTRIMAZOLE 1 %: 1 | 14 days supply | Qty: 28 | Fill #0

## 2019-07-05 MED FILL — MELOXICAM 15 MG TABLET: 15 | 30 days supply | Qty: 30 | Fill #0

## 2019-07-05 MED FILL — OZEMPIC 0.25 OR 0.5 MG/DOSE: 2 | 84 days supply | Qty: 5 | Fill #0

## 2019-07-05 NOTE — Assessment & Plan Note (Signed)
This is a pleasant 64 year old female with diabetes, she is morbidly obese. For the past month and a half she is had pain in the back of her heel, worse with prolonged weightbearing. On exam she has tenderness at the Achilles insertion, and lesser so at the retrocalcaneal bursa. We are going to start with conservative treatment, x-rays of the heel, bilateral heel lifts, formal physical therapy, meloxicam. Return to see me in 4 to 6 weeks, we will consider retrocalcaneal bursa injection if no better followed by 1 week of boot immobilization to protect the Achilles.

## 2019-07-05 NOTE — Patient Instructions (Addendum)
For Diabetes  Continue Metformin as you are taking it  Measure fasting blood sugar every day when you wake up: goal for now is to get this to 80-130  Increase daily Tresiba dose by 3 units at a time, twice per week, until fasting sugars are consistently 80-130, then continue at that dose  Will add Ozempic weekly injection  Start at 0.5 mg weekly  If nausea, next week do 0.25 mg half dose and continue for 2-4 weeks then go back up to 0.5 mg weekly  Plan to recheck A1C in 3 months  Plan to follow-up in the office sooner if you experience low sugars   Plan to follow-up in the office sooner if your fasting sugars are consistently high  Bring all sugar readings with you to your office visits  For rash: sent cream

## 2019-07-05 NOTE — Progress Notes (Signed)
    Procedures performed today:    None.  Independent interpretation of notes and tests performed by another provider:   None.  Brief History, Exam, Impression, and Recommendations:    Left insertional Achilles tendinopathy This is a pleasant 64 year old female with diabetes, she is morbidly obese. For the past month and a half she is had pain in the back of her heel, worse with prolonged weightbearing. On exam she has tenderness at the Achilles insertion, and lesser so at the retrocalcaneal bursa. We are going to start with conservative treatment, x-rays of the heel, bilateral heel lifts, formal physical therapy, meloxicam. Return to see me in 4 to 6 weeks, we will consider retrocalcaneal bursa injection if no better followed by 1 week of boot immobilization to protect the Achilles.    ___________________________________________ Ihor Austin. Benjamin Stain, M.D., ABFM., CAQSM. Primary Care and Sports Medicine [facility]  Adjunct Instructor of Family Medicine  [employer]

## 2019-07-05 NOTE — Progress Notes (Signed)
Jessica [name] is a 64 y.o. female who presents to  [facility] at [employer]  today, 07/05/19, seeking care for the following: . DM2 . Rash ?ringworm? . Iron deficiency      ASSESSMENT & PLAN with other pertinent history/findings:  The primary encounter diagnosis was Type 2 diabetes mellitus with microalbuminuria, with long-term current use of insulin (HCC). A diagnosis of Rash and nonspecific skin eruption was also pertinent to this visit.     Patient Instructions  For Diabetes  Continue Metformin as you are taking it  Measure fasting blood sugar every day when you wake up: goal for now is to get this to 80-130  Increase daily Tresiba dose by 3 units at a time, twice per week, until fasting sugars are consistently 80-130, then continue at that dose  Will add Ozempic weekly injection  Start at 0.5 mg weekly  If nausea, next week do 0.25 mg half dose and continue for 2-4 weeks then go back up to 0.5 mg weekly  Plan to recheck A1C in 3 months  Plan to follow-up in the office sooner if you experience low sugars   Plan to follow-up in the office sooner if your fasting sugars are consistently high  Bring all sugar readings with you to your office visits  For rash: sent cream       No orders of the defined types were placed in this encounter.   Meds ordered this encounter  Medications  . Semaglutide,0.25 or 0.5MG /DOS, (OZEMPIC, 0.25 OR 0.5 MG/DOSE,) 2 MG/1.5ML SOPN    Sig: Inject 0.5 mg into the skin once a week.    Dispense:  4 pen    Refill:  3  . clotrimazole (CLOTRIMAZOLE ANTI-FUNGAL) 1 % cream    Sig: Apply 1 application topically 2 (two) times daily.    Dispense:  24 g    Refill:  0       Follow-up instructions: Return in about 3 months (around 10/04/2019) for recheck A1C in office .                                         BP 111/75 (BP Location: Left Arm,  Patient Position: Sitting, Cuff Size: Large)   Pulse 61   Wt 238 lb (108 kg)   SpO2 99%   BMI 48.07 kg/m   Current Meds  Medication Sig  . amLODipine (NORVASC) 5 MG tablet Take 1 tablet (5 mg total) by mouth daily.  Marland Kitchen aspirin 81 MG tablet Take 81 mg by mouth at bedtime.  Clinical research associate Bandages & Supports (MEDICAL COMPRESSION STOCKINGS) MISC Knee high medium compression stockings, large circumference, fit to patient's preference  . ferrous sulfate 325 (65 FE) MG tablet Take 1 tablet (325 mg total) by mouth 2 (two) times daily with a meal.  . Insulin Degludec (TRESIBA FLEXTOUCH) 200 UNIT/ML SOPN Inject 50-80 Units into the skin at bedtime. Increase by 2 units every 3 days until FBS consistently 90-130 then stay at that dose.  . Insulin Isophane & Regular Human (HUMULIN 70/30 KWIKPEN) (70-30) 100 UNIT/ML PEN Inject 30-50 Units into the skin 2 (two) times daily. As directed (will be hopefully switching to [place])  . Insulin Pen Needle (PEN NEEDLES) 32G X 4 MM MISC Inject 1 each into the skin 2 (two) times daily.  . Melatonin 5 MG CHEW Chew 5  mg by mouth at bedtime.  . metFORMIN (GLUCOPHAGE) 1000 MG tablet Take 1 tablet (1,000 mg total) by mouth 2 (two) times daily with a meal.  . quinapril-hydrochlorothiazide (ACCURETIC) 20-12.5 MG tablet Take 2 tablets by mouth daily.  . rosuvastatin (CRESTOR) 40 MG tablet Take 1 tablet (40 mg total) by mouth at bedtime.  . sertraline (ZOLOFT) 100 MG tablet Take 1 tablet (100 mg total) by mouth daily.  Marland Kitchen Specialty Vitamins Products (MAGNESIUM, AMINO ACID CHELATE,) 133 MG tablet Take by mouth.    Results for orders placed or performed in visit on 02/08/19 (from the past 72 hour(s))  CBC     Status: Abnormal   Collection Time: 07/03/19  8:27 AM  Result Value Ref Range   WBC 9.2 3.8 - 10.8 Thousand/uL   RBC 4.78 3.80 - 5.10 Million/uL   Hemoglobin 11.8 11.7 - 15.5 g/dL   HCT 36.9 35.0 - 45.0 %   MCV 77.2 (L) 80.0 - 100.0 fL   MCH 24.7 (L) 27.0 - 33.0 pg    MCHC 32.0 32.0 - 36.0 g/dL   RDW 14.0 11.0 - 15.0 %   Platelets 380 140 - 400 Thousand/uL   MPV 9.4 7.5 - 12.5 fL  A1C     Status: Abnormal   Collection Time: 07/03/19  8:27 AM  Result Value Ref Range   Hgb A1c MFr Bld 9.3 (H) <5.7 % of total Hgb    Comment: For someone without known diabetes, a hemoglobin A1c value of 6.5% or greater indicates that they may have  diabetes and this should be confirmed with a follow-up  test. . For someone with known diabetes, a value <7% indicates  that their diabetes is well controlled and a value  greater than or equal to 7% indicates suboptimal  control. A1c targets should be individualized based on  duration of diabetes, age, comorbid conditions, and  other considerations. . Currently, no consensus exists regarding use of hemoglobin A1c for diagnosis of diabetes for children. .    Mean Plasma Glucose 220 (calc)   eAG (mmol/L) 12.2 (calc)  LIPID SCREENING     Status: Abnormal   Collection Time: 07/03/19  8:27 AM  Result Value Ref Range   Cholesterol 143 <200 mg/dL   HDL 47 (L) > OR = 50 mg/dL   Triglycerides 184 (H) <150 mg/dL   LDL Cholesterol (Calc) 70 mg/dL (calc)    Comment: Reference range: <100 . Desirable range <100 mg/dL for primary prevention;   <70 mg/dL for patients with CHD or diabetic patients  with > or = 2 CHD risk factors. Marland Kitchen LDL-C is now calculated using the Martin-Hopkins  calculation, which is a validated novel method providing  better accuracy than the Friedewald equation in the  estimation of LDL-C.  Cresenciano Genre et al. Annamaria Helling. 6237;628(31): 2061-2068  (http://education.[facility].com/faq/FAQ164)    Total CHOL/HDL Ratio 3.0 <5.0 (calc)   Non-HDL Cholesterol (Calc) 96 <130 mg/dL (calc)    Comment: For patients with diabetes plus 1 major ASCVD risk  factor, treating to a non-HDL-C goal of <100 mg/dL  (LDL-C of <70 mg/dL) is considered a therapeutic  option.   Fe+TIBC+Fer     Status: Abnormal   Collection  Time: 07/03/19  8:27 AM  Result Value Ref Range   Iron 34 (L) 45 - 160 mcg/dL   TIBC 403 250 - 450 mcg/dL (calc)   %SAT 8 (L) 16 - 45 % (calc)   Ferritin 18 16 - 288 ng/mL  No results found.  Depression screen [facility] 2/9 02/08/2019 02/22/2018  Decreased Interest 0 2  Down, Depressed, Hopeless 0 1  PHQ - 2 Score 0 3  Altered sleeping 1 1  Tired, decreased energy 2 1  Change in appetite 2 3  Feeling bad or failure about yourself  0 1  Trouble concentrating 1 2  Moving slowly or fidgety/restless 0 0  Suicidal thoughts 0 0  PHQ-9 Score 6 11  Difficult doing work/chores Somewhat difficult -    GAD 7 : Generalized Anxiety Score 02/08/2019 02/22/2018  Nervous, Anxious, on Edge 0 0  Control/stop worrying 0 1  Worry too much - different things 1 1  Trouble relaxing 0 0  Restless 0 0  Easily annoyed or irritable 1 0  Afraid - awful might happen 0 0  Total GAD 7 Score 2 2  Anxiety Difficulty Somewhat difficult -      All questions at time of visit were answered - patient instructed to contact office with any additional concerns or updates.  ER/RTC precautions were reviewed with the patient.  Please note: voice recognition software was used to produce this document, and typos may escape review. Please contact Dr. Lyn Hollingshead for any needed clarifications.

## 2019-07-16 ENCOUNTER — Ambulatory Visit: Payer: No Typology Code available for payment source | Admitting: Rehabilitative and Restorative Service Providers"

## 2019-08-06 ENCOUNTER — Encounter: Payer: No Typology Code available for payment source | Admitting: Sports Medicine

## 2019-08-08 MED FILL — TRESIBA FLEXTOUCH 200 UNITS: 200 | 24 days supply | Qty: 15 | Fill #3

## 2019-08-08 MED FILL — ROSUVASTATIN CALCIUM 40 MG: 40 | 90 days supply | Qty: 90 | Fill #1

## 2019-08-14 ENCOUNTER — Ambulatory Visit (INDEPENDENT_AMBULATORY_CARE_PROVIDER_SITE_OTHER): Payer: No Typology Code available for payment source | Admitting: Sports Medicine

## 2019-08-14 DIAGNOSIS — M67972 Unspecified disorder of synovium and tendon, left ankle and foot: Secondary | ICD-10-CM

## 2019-08-14 NOTE — Progress Notes (Signed)
    Procedures performed today:    None.  Independent interpretation of notes and tests performed by another provider:   None.  Brief History, Exam, Impression, and Recommendations:    Left insertional Achilles tendinopathy This is a pleasant morbidly obese 64 year old female with diabetes, we treated her for insertional Achilles tendinitis with heel lifts, meloxicam, x-rays were unremarkable. I had ordered formal PT but she never did, she does report good improvements with the lifts, I would give her some more lifts and she will place these in her house slippers, we are also going to give her some home rehab exercises with the understanding that if symptoms do not improve we will place her into formal physical therapy for 4 to 6 weeks before considering retrocalcaneal bursa injection. Return to see me on an as-needed basis.    ___________________________________________ Ihor Austin. Benjamin Stain, M.D., ABFM., CAQSM. Primary Care and Sports Medicine [facility]  Adjunct Instructor of Family Medicine  [employer]

## 2019-08-14 NOTE — Assessment & Plan Note (Signed)
This is a pleasant morbidly obese 64 year old female with diabetes, we treated her for insertional Achilles tendinitis with heel lifts, meloxicam, x-rays were unremarkable. I had ordered formal PT but she never did, she does report good improvements with the lifts, I would give her some more lifts and she will place these in her house slippers, we are also going to give her some home rehab exercises with the understanding that if symptoms do not improve we will place her into formal physical therapy for 4 to 6 weeks before considering retrocalcaneal bursa injection. Return to see me on an as-needed basis.

## 2019-09-11 MED FILL — QUINAPRIL-HCTZ 20-12.5 MG T: 20-12.5 | 90 days supply | Qty: 180 | Fill #2

## 2019-09-11 MED FILL — SERTRALINE HCL 100 MG TABS: 100 | 90 days supply | Qty: 90 | Fill #1

## 2019-09-11 MED FILL — METFORMIN HCL 1000 MG TABS: 1000 | 90 days supply | Qty: 180 | Fill #2

## 2019-09-26 MED FILL — TRESIBA FLEXTOUCH 200 UNITS: 200 | 30 days supply | Qty: 12 | Fill #4

## 2019-09-26 MED FILL — MELOXICAM 15 MG TABLET: 15 | 30 days supply | Qty: 30 | Fill #1

## 2019-10-08 ENCOUNTER — Ambulatory Visit: Payer: No Typology Code available for payment source | Admitting: Osteopathic Medicine

## 2019-11-02 ENCOUNTER — Emergency Department (INDEPENDENT_AMBULATORY_CARE_PROVIDER_SITE_OTHER)
Admission: EM | Admit: 2019-11-02 | Discharge: 2019-11-02 | Disposition: A | Discharge status: Home / Self Care | Payer: No Typology Code available for payment source | Source: Home / Self Care | Attending: Family Medicine | Admitting: Family Medicine

## 2019-11-02 ENCOUNTER — Encounter: Payer: Self-pay | Admitting: Emergency Medicine

## 2019-11-02 ENCOUNTER — Other Ambulatory Visit: Payer: Self-pay

## 2019-11-02 ENCOUNTER — Emergency Department (INDEPENDENT_AMBULATORY_CARE_PROVIDER_SITE_OTHER): Payer: No Typology Code available for payment source

## 2019-11-02 DIAGNOSIS — M940 Chondrocostal junction syndrome [Tietze]: Secondary | ICD-10-CM

## 2019-11-02 DIAGNOSIS — R05 Cough: Secondary | ICD-10-CM | POA: Diagnosis not present

## 2019-11-02 DIAGNOSIS — J069 Acute upper respiratory infection, unspecified: Secondary | ICD-10-CM | POA: Diagnosis not present

## 2019-11-02 IMAGING — DX DG CHEST 2V
2 series · 2 of 2 positions shown · non-contrast
Comparison: None.

CLINICAL DATA: Upper respiratory symptoms and cough for 5 days.

EXAM:
CHEST - 2 VIEW

[chest pa]
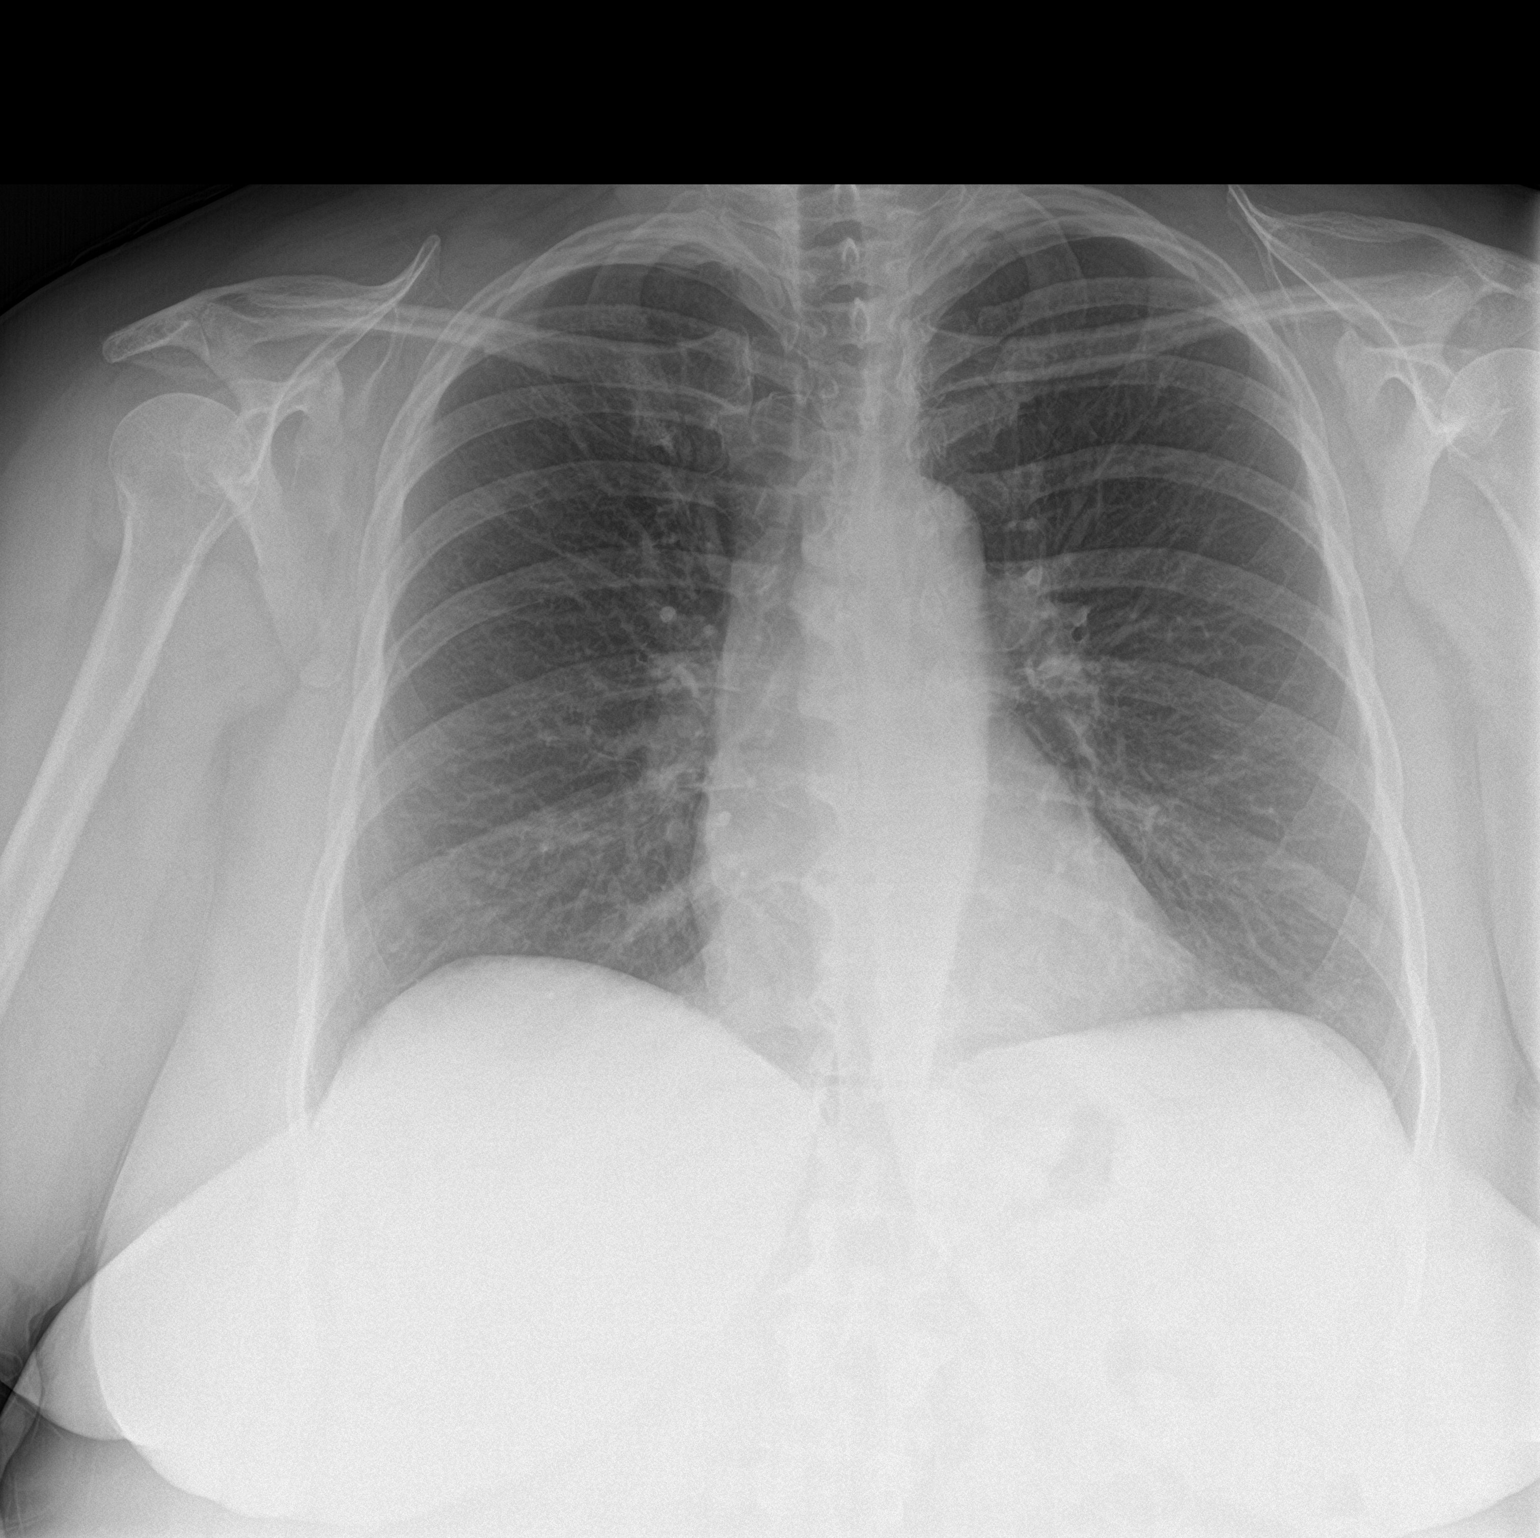

[chest lat]
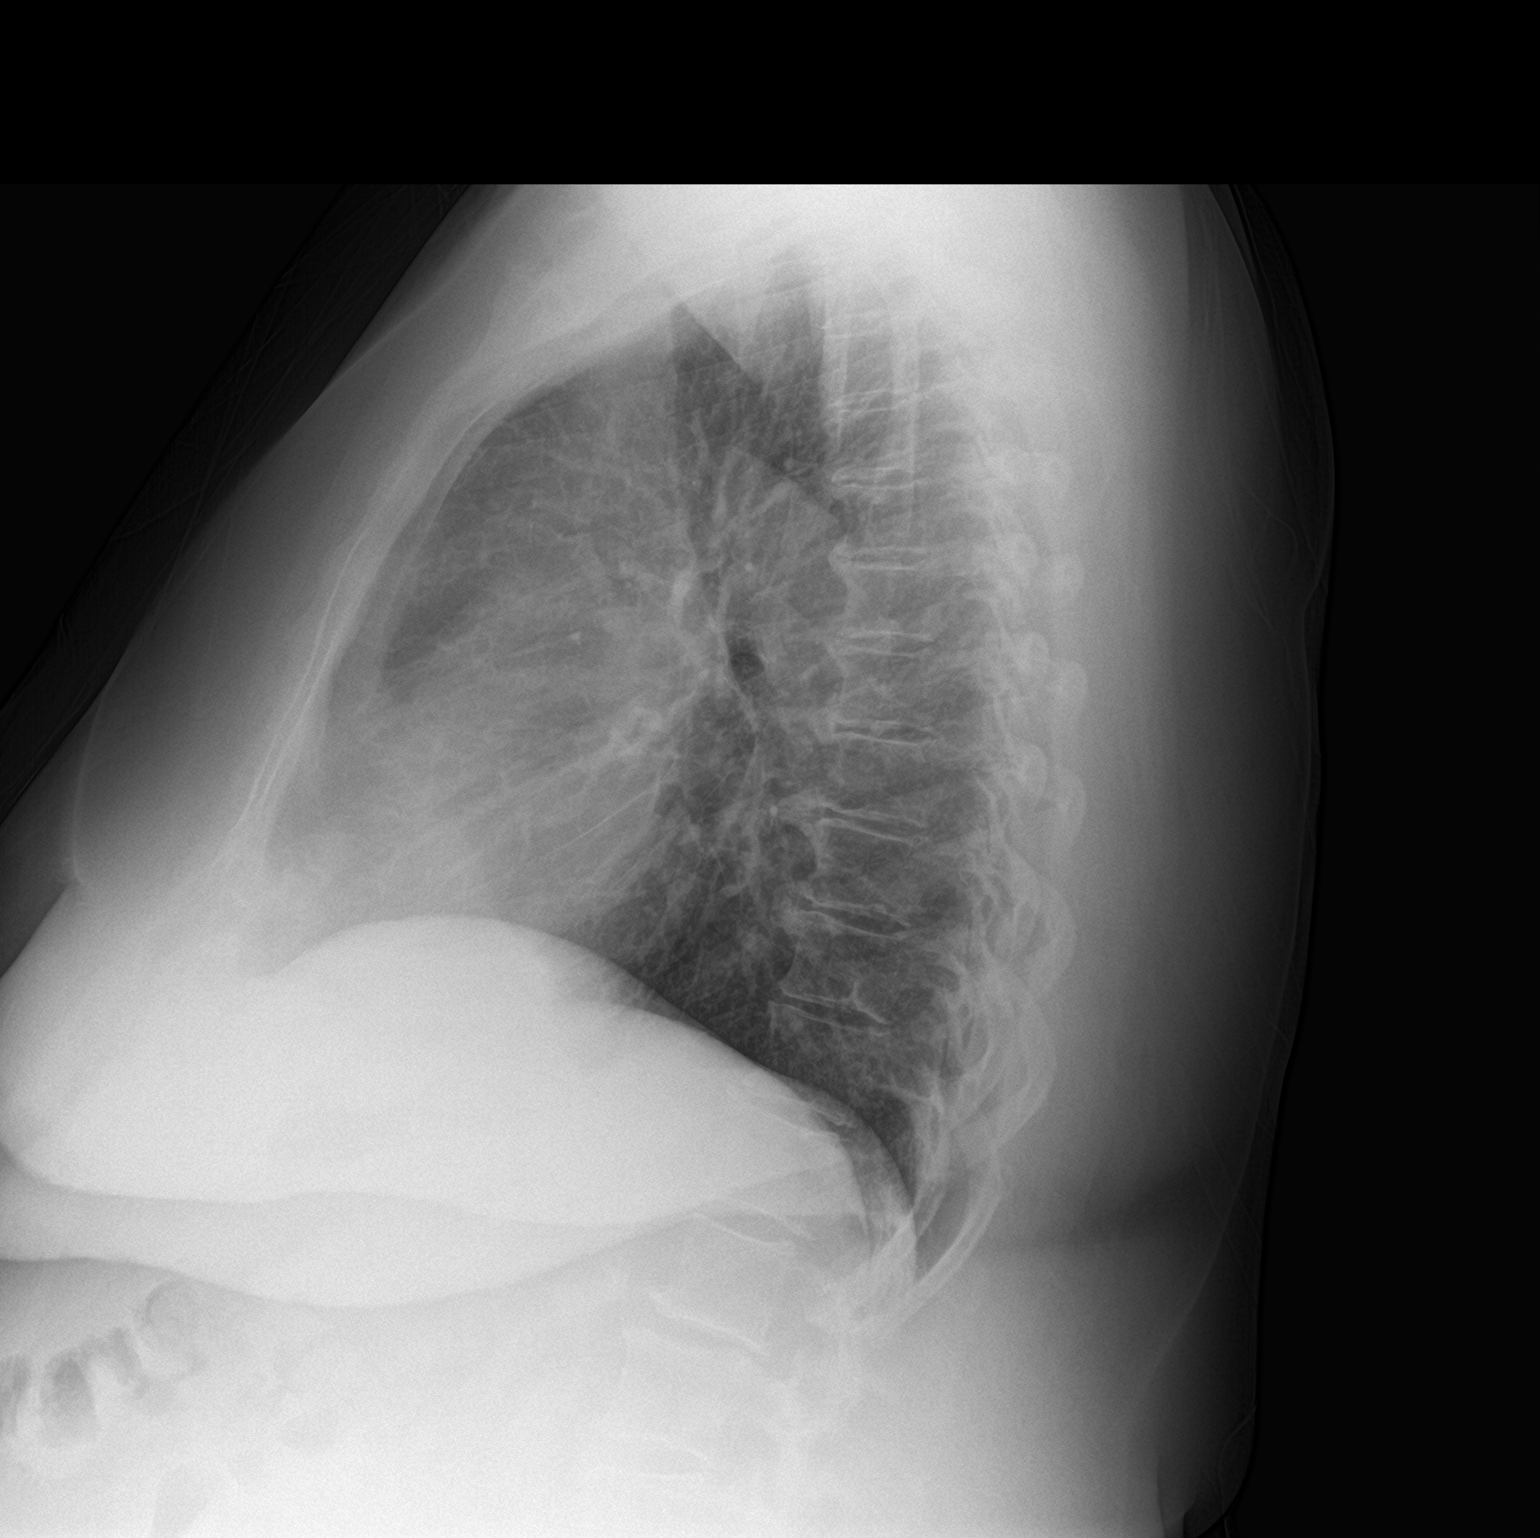

[2 of 2 positions shown; findings below may reference images not displayed]

FINDINGS: The heart size and mediastinal contours are within normal limits.
Both lungs are clear. Degenerative changes are seen in the spine.
IMPRESSION: No active cardiopulmonary disease.

## 2019-11-02 MED ORDER — DOXYCYCLINE HYCLATE 100 MG PO CAPS: ORAL_CAPSULE | ORAL | 0 refills | Status: DC

## 2019-11-02 NOTE — Discharge Instructions (Addendum)
Take plain guaifenesin (1200mg  extended release tabs such as Mucinex) twice daily, with plenty of water, for cough and congestion. Get adequate rest.   May use Afrin nasal spray (or generic oxymetazoline) each morning for about 5 days and then discontinue.  Also recommend using saline nasal spray several times daily and saline nasal irrigation (AYR is a common brand).  Use Flonase nasal spray each morning after using Afrin nasal spray and saline nasal irrigation. Try warm salt water gargles for sore throat.  Stop all antihistamines for now, and other non-prescription cough/cold preparations. May take Ibuprofen 200mg , 4 tabs every 8 hours with food for chest/sternum discomfort. May apply ice pack to sternum 2 to 3 times daily. Begin doxycycline if not improving about one week or if persistent fever develops   Isolate yourself until COVID-19 test result is available.   If your COVID19 test is positive, then you are infected with the novel coronavirus and could give the virus to others.  Please continue isolation at home for at least 10 days since the start of your symptoms.  Once you complete your 10 day quarantine, you may return to normal activities as long as you've not had a fever for over 24 hours (without taking fever reducing medicine) and your symptoms are improving. Please continue good preventive care measures, including:  frequent hand-washing, avoid touching your face, cover coughs/sneezes, stay out of crowds and keep a 6 foot distance from others.  Go to the nearest hospital emergency room if fever/cough/breathlessness are severe or illness seems like a threat to life.

## 2019-11-02 NOTE — ED Triage Notes (Signed)
Patient here with complaints general aches, feverish sensations, cough and tightness in chest, raw throat for past 5 days. Has been taking care of toddler diagnosed with ear infection and sinus problem yesterday. Has had covid vaccinations.

## 2019-11-02 NOTE — ED Provider Notes (Addendum)
[facility]    CSN: [id number] Arrival date & time: 11/02/19  1009      History   Chief Complaint Chief Complaint  Patient presents with   Cough   Fever   Sore Throat   Generalized Body Aches    HPI Jessica [name] is a 64 y.o. female.   Patient complains of five day history of typical cold-like symptoms developing over several days, including mild sore throat, sinus congestion, headache, fatigue, and cough.  She developed fever/chills yesterday.   She denies shortness of breath, and changes in taste/smell.  She has felt tightness in her anterior chest for about two weeks.   The history is provided by the patient.    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   Qualifier: Diagnosis of  By: Linford Arnold MD, Catherine     Tinnitus aurium, bilateral     Patient Active Problem List   Diagnosis Date Noted   Left insertional Achilles tendinopathy 07/05/2019   Encounter for medication monitoring 09/05/2018   Low mean corpuscular volume (MCV) 09/05/2018   History of iron deficiency anemia 09/05/2018   History of vitamin D deficiency    Ulnar neuropathy at elbow, right 06/05/2018   Leg edema, left 02/23/2018   Angular cheilitis 02/23/2018   Hyperlipidemia associated with type 2 diabetes mellitus (HCC) 02/23/2018   Hypertension associated with diabetes (HCC) 02/23/2018   Diabetic 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 (HCC) 02/23/2018   Epistaxis 09/12/2017   Carpal tunnel syndrome 09/13/2016   Headache, chronic daily 09/13/2016   Acute medial meniscus tear of left knee 08/10/2016   Primary osteoarthritis of left knee  08/10/2016   Tinnitus of both ears 08/10/2016   Hydradenitis 01/18/2016   Primary osteoarthritis of both first carpometacarpal joints 01/06/2016   Insomnia 11/24/2015   Anxiety 07/30/2015   Episode of recurrent major depressive disorder (HCC) 07/30/2015   Long-term insulin use (HCC) 07/30/2015   Microalbuminuria due to type 2 diabetes mellitus (HCC) 07/30/2015   Type 2 diabetes mellitus with microalbuminuria, with long-term current use of insulin (HCC) 02/06/2007    Past Surgical History:  Procedure Laterality Date   APPENDECTOMY     CARDIAC CATHETERIZATION     Negative. Dx was Anxiety   HERNIA REPAIR     LEG SURGERY     TUBAL LIGATION      OB History   No obstetric history on file.      Home Medications    Prior to Admission medications   Medication Sig Start Date End Date Taking? Authorizing Provider  amLODipine (NORVASC) 5 MG tablet Take 1 tablet (5 mg total) by mouth daily. 02/08/19   Sunnie Nielsen, DO  aspirin 81 MG tablet Take 81 mg by mouth at bedtime.    [provider]  clotrimazole (CLOTRIMAZOLE ANTI-FUNGAL) 1 % cream Apply 1 application topically 2 (two) times daily. 07/05/19   Sunnie Nielsen, DO  doxycycline (VIBRAMYCIN) 100 MG capsule Take one cap PO every 12 hours with food. 11/02/19   Lattie Haw, MD  Elastic Bandages & Supports (MEDICAL COMPRESSION STOCKINGS) MISC Knee high medium compression stockings, large  circumference, fit to patient's preference 02/22/18   Carlis Stable, PA-C  ferrous sulfate 325 (65 FE) MG tablet Take 1 tablet (325 mg total) by mouth 2 (two) times daily with a meal. 02/08/19   Sunnie Nielsen, DO  Insulin Degludec (TRESIBA FLEXTOUCH) 200 UNIT/ML SOPN Inject 50-80 Units into the skin at bedtime. Increase by 2 units every 3 days until FBS consistently 90-130 then stay at that dose. 02/08/19   Sunnie Nielsen, DO  Insulin Isophane & Regular Human (HUMULIN 70/30 KWIKPEN) (70-30) 100  UNIT/ML PEN Inject 30-50 Units into the skin 2 (two) times daily. As directed (will be hopefully switching to [place]) 02/08/19   Sunnie Nielsen, DO  Insulin Pen Needle (PEN NEEDLES) 32G X 4 MM MISC Inject 1 each into the skin 2 (two) times daily. 06/05/18   Carlis Stable, PA-C  Melatonin 5 MG CHEW Chew 5 mg by mouth at bedtime.    [provider]  meloxicam (MOBIC) 15 MG tablet One tab PO qAM with a meal for 2 weeks, then daily prn pain. 07/05/19   Monica Becton, MD  metFORMIN (GLUCOPHAGE) 1000 MG tablet Take 1 tablet (1,000 mg total) by mouth 2 (two) times daily with a meal. 02/08/19   Sunnie Nielsen, DO  quinapril-hydrochlorothiazide (ACCURETIC) 20-12.5 MG tablet Take 2 tablets by mouth daily. 02/08/19   Sunnie Nielsen, DO  rosuvastatin (CRESTOR) 40 MG tablet Take 1 tablet (40 mg total) by mouth at bedtime. 02/08/19   Sunnie Nielsen, DO  Semaglutide,0.25 or 0.5MG /DOS, (OZEMPIC, 0.25 OR 0.5 MG/DOSE,) 2 MG/1.5ML SOPN Inject 0.5 mg into the skin once a week. 07/05/19   Sunnie Nielsen, DO  sertraline (ZOLOFT) 100 MG tablet Take 1 tablet (100 mg total) by mouth daily. 02/08/19   Sunnie Nielsen, DO  Specialty Vitamins Products (MAGNESIUM, AMINO ACID CHELATE,) 133 MG tablet Take by mouth.    [provider]    Family History Family History  Problem Relation Age of Onset   Hyperlipidemia Mother    Hypertension Mother    Stroke Father    Thyroid cancer Sister    Diabetes Maternal Aunt    Skin cancer Maternal Uncle     Social History Social History   Tobacco Use   Smoking status: Never Smoker   Smokeless tobacco: Never Used  Building services engineer Use: Never used  Substance Use Topics   Alcohol use: No   Drug use: Never     Allergies   Aspirin   Review of Systems Review of Systems  + sore throat + cough No pleuritic pain, but feels tight in his anterior chest for two weeks. No wheezing + nasal congestion +  post-nasal drainage No sinus pain/pressure No itchy/red eyes No earache No hemoptysis No SOB + fever, + chills No nausea No vomiting No abdominal pain No diarrhea No urinary symptoms No skin rash + fatigue + myalgias + headache    Physical Exam Triage Vital Signs ED Triage Vitals  Enc Vitals Group     BP 11/02/19 1040 (!) 143/77     Pulse Rate 11/02/19 1040 84     Resp 11/02/19 1040 18     Temp 11/02/19 1040 99.1 F (37.3 C)     Temp Source 11/02/19 1040 Oral     SpO2 11/02/19 1040 97 %     Weight 11/02/19 1041 240 lb (108.9 kg)     Height 11/02/19 1041 4\' 11"  (1.499 m)     Head Circumference --  Peak Flow --      Pain Score 11/02/19 1041 8     Pain Loc --      Pain Edu? --      Excl. in GC? --    No data found.  Updated Vital Signs BP (!) 143/77 (BP Location: Right Wrist)    Pulse 84    Temp 99.1 F (37.3 C) (Oral)    Resp 18    Ht 4\' 11"  (1.499 m)    Wt 108.9 kg    SpO2 97%    BMI 48.47 kg/m   Visual Acuity Right Eye Distance:   Left Eye Distance:   Bilateral Distance:    Right Eye Near:   Left Eye Near:    Bilateral Near:     Physical Exam Nursing notes and Vital Signs reviewed. Appearance:  Patient appears stated age, and in no acute distress Eyes:  Pupils are equal, round, and reactive to light and accomodation.  Extraocular movement is intact.  Conjunctivae are not inflamed  Ears:  Canals normal.  Tympanic membranes normal.  Nose:  Mildly congested turbinates.  No sinus tenderness.   Pharynx:  Normal Neck:  Supple.  Mildly enlarged lateral nodes are present, tender to palpation on the left.   Lungs:  Clear to auscultation.  Breath sounds are equal.  Moving air well. Chest:  Distinct tenderness to palpation over the mid-sternum.  Heart:  Regular rate and rhythm without murmurs, rubs, or gallops.  Abdomen:  Nontender without masses or hepatosplenomegaly.  Bowel sounds are present.  No CVA or flank tenderness.  Extremities:  No edema.  Skin:   No rash present.   UC Treatments / Results  Labs (all labs ordered are listed, but only abnormal results are displayed) Labs Reviewed  SARS-COV-2 RNA,(COVID-19) QUALITATIVE NAAT    EKG   Radiology DG Chest 2 View  Result Date: 11/02/2019 CLINICAL DATA:  Upper respiratory symptoms and cough for 5 days. EXAM: CHEST - 2 VIEW COMPARISON:  None. FINDINGS: The heart size and mediastinal contours are within normal limits. Both lungs are clear. Degenerative changes are seen in the spine. IMPRESSION: No active cardiopulmonary disease. Electronically Signed   By: 11/04/2019 M.D.   On: 11/02/2019 11:41    Procedures Procedures (including critical care time)  Medications Ordered in UC Medications - No data to display  Initial Impression / Assessment and Plan / UC Course  I have reviewed the triage vital signs and the nursing notes.  Pertinent labs & imaging results that were available during my care of the patient were reviewed by me and considered in my medical decision making (see chart for details).    Benign exam today.  Negative chest x-ray reassuring.  There is no evidence of bacterial infection today.  Treat symptomatically for now. COVID19 PCR pending. Followup with Family Doctor if not improved in 7 to 10 days.   Final Clinical Impressions(s) / UC Diagnoses   Final diagnoses:  Viral URI with cough  Costochondritis     Discharge Instructions     Take plain guaifenesin (1200mg  extended release tabs such as Mucinex) twice daily, with plenty of water, for cough and congestion. Get adequate rest.   May use Afrin nasal spray (or generic oxymetazoline) each morning for about 5 days and then discontinue.  Also recommend using saline nasal spray several times daily and saline nasal irrigation (AYR is a common brand).  Use Flonase nasal spray each morning after using Afrin nasal spray and saline  nasal irrigation. Try warm salt water gargles for sore throat.  Stop all  antihistamines for now, and other non-prescription cough/cold preparations. May take Ibuprofen 200mg , 4 tabs every 8 hours with food for chest/sternum discomfort. May apply ice pack to sternum 2 to 3 times daily. Begin doxycycline if not improving about one week or if persistent fever develops (Given a prescription to hold, with an expiration date)    Isolate yourself until COVID-19 test result is available.   If your COVID19 test is positive, then you are infected with the novel coronavirus and could give the virus to others.  Please continue isolation at home for at least 10 days since the start of your symptoms.  Once you complete your 10 day quarantine, you may return to normal activities as long as you've not had a fever for over 24 hours (without taking fever reducing medicine) and your symptoms are improving. Please continue good preventive care measures, including:  frequent hand-washing, avoid touching your face, cover coughs/sneezes, stay out of crowds and keep a 6 foot distance from others.  Go to the nearest hospital emergency room if fever/cough/breathlessness are severe or illness seems like a threat to life.      ED Prescriptions    Medication Sig Dispense Auth. Provider   doxycycline (VIBRAMYCIN) 100 MG capsule Take one cap PO every 12 hours with food. 14 capsule Lattie HawBeese, Antavius Sperbeck A, MD        Lattie HawBeese, Mackensey Bolte A, MD 11/08/19 [id number]    Lattie HawBeese, Conor Filsaime A, MD 11/08/19 [phone]

## 2019-11-04 LAB — SARS-COV-2 RNA,(COVID-19) QUALITATIVE NAAT: SARS CoV2 RNA: NOT DETECTED

## 2019-11-13 ENCOUNTER — Other Ambulatory Visit: Payer: Self-pay

## 2019-11-13 ENCOUNTER — Other Ambulatory Visit: Payer: Self-pay | Admitting: Medical-Surgical

## 2019-11-13 ENCOUNTER — Encounter: Payer: Self-pay | Admitting: Medical-Surgical

## 2019-11-13 ENCOUNTER — Ambulatory Visit (INDEPENDENT_AMBULATORY_CARE_PROVIDER_SITE_OTHER): Payer: No Typology Code available for payment source | Admitting: Medical-Surgical

## 2019-11-13 VITALS — BP 111/72 | HR 67 | Temp 98.0°F | Ht 59.0 in | Wt 234.8 lb

## 2019-11-13 DIAGNOSIS — E1169 Type 2 diabetes mellitus with other specified complication: Secondary | ICD-10-CM | POA: Diagnosis not present

## 2019-11-13 DIAGNOSIS — Z862 Personal history of diseases of the blood and blood-forming organs and certain disorders involving the immune mechanism: Secondary | ICD-10-CM

## 2019-11-13 DIAGNOSIS — Z794 Long term (current) use of insulin: Secondary | ICD-10-CM

## 2019-11-13 DIAGNOSIS — R809 Proteinuria, unspecified: Secondary | ICD-10-CM

## 2019-11-13 DIAGNOSIS — E1159 Type 2 diabetes mellitus with other circulatory complications: Secondary | ICD-10-CM

## 2019-11-13 DIAGNOSIS — Z114 Encounter for screening for human immunodeficiency virus [HIV]: Secondary | ICD-10-CM

## 2019-11-13 DIAGNOSIS — I10 Essential (primary) hypertension: Secondary | ICD-10-CM

## 2019-11-13 DIAGNOSIS — Z0000 Encounter for general adult medical examination without abnormal findings: Secondary | ICD-10-CM

## 2019-11-13 DIAGNOSIS — H9313 Tinnitus, bilateral: Secondary | ICD-10-CM | POA: Diagnosis not present

## 2019-11-13 DIAGNOSIS — R058 Other specified cough: Secondary | ICD-10-CM

## 2019-11-13 DIAGNOSIS — F418 Other specified anxiety disorders: Secondary | ICD-10-CM

## 2019-11-13 DIAGNOSIS — R05 Cough: Secondary | ICD-10-CM

## 2019-11-13 DIAGNOSIS — E876 Hypokalemia: Secondary | ICD-10-CM

## 2019-11-13 DIAGNOSIS — E785 Hyperlipidemia, unspecified: Secondary | ICD-10-CM

## 2019-11-13 DIAGNOSIS — E871 Hypo-osmolality and hyponatremia: Secondary | ICD-10-CM

## 2019-11-13 DIAGNOSIS — E1129 Type 2 diabetes mellitus with other diabetic kidney complication: Secondary | ICD-10-CM

## 2019-11-13 MED ORDER — TRESIBA FLEXTOUCH 200 UNIT/ML ~~LOC~~ SOPN: 50.0000 [IU] | PEN_INJECTOR | Freq: Every day | SUBCUTANEOUS | 11 refills | Status: DC

## 2019-11-13 MED FILL — TRESIBA FLEXTOUCH 200 UNITS: 200 | 20 days supply | Qty: 9 | Fill #0

## 2019-11-13 NOTE — Progress Notes (Addendum)
HPI: Jessica [name] is a 64 y.o. 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  Using [place], Ozempic, and Metformin, tolerating well.  HTN- stable on amlodipine and Accuretic  Cough- was sick since 8/14, went to UC, negative for COVID. Persistent dry hacking cough.   Hot and sweaty all day when trying to sleep. Fatigued.  Acid reflux with dry heaves last night. OTC Mucinex as needed, helping some.  ? Anxiety- Starts sweating with a hot flash, shortness of breath. Happens when getting ready for work in the evenings for the last 2 nights. Does not feel particularly anxious but does admit to worrying about being in an accident while driving and such.   Ringing in ears- going on for 3-4 years. Wears a headset at work for her whole shift. Constant ringing that interrupts sleep, pulsatile to a degree. Comes and goes in severity, accompanied by frontal headache. Takes Tylenol or Excedrin and puts a cool rag on her head.   Past medical, surgical, social and family history reviewed:  Patient Active Problem List   Diagnosis Date Noted  . Left insertional Achilles tendinopathy 07/05/2019  . Encounter for medication monitoring 09/05/2018    . Low mean corpuscular volume (MCV) 09/05/2018  . History of iron deficiency anemia 09/05/2018  . History of vitamin D deficiency   . Ulnar neuropathy at elbow, right 06/05/2018  . Leg edema, left 02/23/2018  . Angular cheilitis 02/23/2018  . Hyperlipidemia associated with type 2 diabetes mellitus ([place]) 02/23/2018  . Hypertension associated with diabetes ([place]) 02/23/2018  . Diabetic 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 ([place]) 02/23/2018  . Epistaxis 09/12/2017  . Carpal tunnel syndrome 09/13/2016  . Headache, chronic daily 09/13/2016  . Acute medial meniscus tear of left knee 08/10/2016  . Primary osteoarthritis of left knee 08/10/2016  . Tinnitus of both ears 08/10/2016  . Hydradenitis 01/18/2016  . Primary osteoarthritis of both first carpometacarpal joints 01/06/2016  . Insomnia 11/24/2015  . Anxiety 07/30/2015  . Episode of recurrent major depressive disorder ([place]) 07/30/2015  . Long-term insulin use ([place]) 07/30/2015  . Microalbuminuria due to type 2 diabetes mellitus ([place]) 07/30/2015  . Type 2 diabetes mellitus with microalbuminuria, with long-term current use of insulin ([place]) 02/06/2007    Past Surgical History:  Procedure Laterality Date  . APPENDECTOMY    . CARDIAC CATHETERIZATION     Negative. Dx was Anxiety  . HERNIA REPAIR    . LEG SURGERY    . TUBAL LIGATION      Social History   Tobacco Use  . Smoking status: Never Smoker  . Smokeless tobacco: Never Used  Substance Use Topics  . Alcohol use: No    Family History  Problem Relation Age of Onset  . Hyperlipidemia Mother   . Hypertension Mother   . Stroke Father   .  Thyroid cancer Sister   . Diabetes Maternal Aunt   . Skin cancer Maternal Uncle      Current medication list and allergy/intolerance information reviewed:    Current Outpatient Medications  Medication Sig Dispense Refill  . amLODipine (NORVASC) 5 MG tablet Take 1 tablet (5 mg total) by mouth daily. 90 tablet 1  .  aspirin 81 MG tablet Take 81 mg by mouth at bedtime.    . cholecalciferol (VITAMIN D3) 25 MCG (1000 UNIT) tablet Take 1,000 Units by mouth daily.    . ferrous sulfate 325 (65 FE) MG tablet Take 1 tablet (325 mg total) by mouth 2 (two) times daily with a meal. 180 tablet 3  . insulin degludec (TRESIBA FLEXTOUCH) 200 UNIT/ML FlexTouch Pen Inject 50-80 Units into the skin at bedtime. Increase by 2 units every 3 days until FBS consistently 90-130 then stay at that dose. 15 mL 11  . Insulin Pen Needle (PEN NEEDLES) 32G X 4 MM MISC Inject 1 each into the skin 2 (two) times daily. 180 each 1  . Melatonin 5 MG CHEW Chew 5 mg by mouth 3 times/day as needed-between meals & bedtime.     . meloxicam (MOBIC) 15 MG tablet One tab PO qAM with a meal for 2 weeks, then daily prn pain. 30 tablet 3  . metFORMIN (GLUCOPHAGE) 1000 MG tablet Take 1 tablet (1,000 mg total) by mouth 2 (two) times daily with a meal. 180 tablet 3  . quinapril-hydrochlorothiazide (ACCURETIC) 20-12.5 MG tablet Take 2 tablets by mouth daily. 180 tablet 3  . rosuvastatin (CRESTOR) 40 MG tablet Take 1 tablet (40 mg total) by mouth at bedtime. 90 tablet 3  . Semaglutide,0.25 or 0.5MG/DOS, (OZEMPIC, 0.25 OR 0.5 MG/DOSE,) 2 MG/1.5ML SOPN Inject 0.5 mg into the skin once a week. 4 pen 3  . sertraline (ZOLOFT) 100 MG tablet Take 1 tablet (100 mg total) by mouth daily. 90 tablet 1  . Specialty Vitamins Products (MAGNESIUM, AMINO ACID CHELATE,) 133 MG tablet Take by mouth.     No current facility-administered medications for this visit.    Allergies  Allergen Reactions  . Aspirin     Bruising with long term use     Review of Systems:  Constitutional:  +  fever, + chills, + recent illness, No unintentional weight changes. + significant fatigue.   HEENT: +  headache, no vision change, no hearing change, + sore throat, +  sinus pressure  Cardiac: No  chest pain, No  pressure, + palpitations, No  Orthopnea  Respiratory:  +  shortness of  breath. +  Cough  Gastrointestinal: No  abdominal pain, +  nausea, No  vomiting,  No  blood in stool, No  diarrhea, No  constipation   Musculoskeletal: No new myalgia/arthralgia  Skin: No  Rash, No other wounds/concerning lesions  Genitourinary: No  incontinence, No  abnormal genital bleeding, No abnormal genital discharge  Hem/Onc: No  easy bruising/bleeding, No  abnormal lymph node  Endocrine: No cold intolerance,  + heat intolerance. + polydipsia   Neurologic: No  weakness, No  dizziness, No  slurred speech/focal weakness/facial droop, tingling in fingers/toes intermittently  Psychiatric: + depression related to recent illness, +  concerns with anxiety, + sleep problems, No mood problems  Depression screen [facility] 2/9 11/13/2019 02/08/2019 02/22/2018  Decreased Interest 3 0 2  Down, Depressed, Hopeless 3 0 1  PHQ - 2 Score 6 0 3  Altered sleeping _0 Tired, decreased energy 3 2  1  Change in appetite _0 Feeling bad or failure about yourself  1 0 1  Trouble concentrating _1 Moving slowly or fidgety/restless 0 0 0  Suicidal thoughts 0 0 0  PHQ-9 Score _2 Difficult doing work/chores Somewhat difficult Somewhat difficult -   GAD 7 : Generalized Anxiety Score 11/13/2019 11/13/2019 02/08/2019 02/22/2018  Nervous, Anxious, on Edge 3 3 0 0  Control/stop worrying 3 3 0 1  Worry too much - different things 3 - 1 1  Trouble relaxing 3 - 0 0  Restless 1 - 0 0  Easily annoyed or irritable 1 - 1 0  Afraid - awful might happen 1 - 0 0  Total GAD 7 Score 15 - 2 2  Anxiety Difficulty Somewhat difficult - Somewhat difficult -     Exam:  BP 111/72   Pulse 67   Temp 98 F (36.7 C) (Oral)   Ht _3  (1.499 m)   Wt 234 lb 12.8 oz (106.5 kg)   SpO2 98%   BMI 47.42 kg/m   Constitutional: VS see above. General Appearance: alert, well-developed, well-nourished, NAD  Eyes: Normal lids and conjunctive, non-icteric sclera  Ears, Nose, Mouth, Throat: MMM, Normal external  inspection ears/nares/mouth/lips/gums. TM normal bilaterally. Pharynx/tonsils no erythema, no exudate. Nasal mucosa normal.   Neck: No masses, trachea midline. No thyroid enlargement. No tenderness/mass appreciated. No lymphadenopathy  Respiratory: Normal respiratory effort. no wheeze, no rhonchi, no rales  Cardiovascular: S1/S2 normal, no murmur, no rub/gallop auscultated. RRR. No lower extremity edema. Pedal pulse II/IV bilaterally DP and PT. No carotid bruit or JVD. No abdominal aortic bruit.  Gastrointestinal: Nontender, no masses. No hepatomegaly, no splenomegaly. No hernia appreciated. Bowel sounds normal. Rectal exam deferred.   Musculoskeletal: Gait normal. No clubbing/cyanosis of digits.   Neurological: Normal balance/coordination. No tremor. No cranial nerve deficit on limited exam. Motor and sensation intact and symmetric. Cerebellar reflexes intact.   Skin: warm, dry, intact. No rash/ulcer. No concerning nevi or subq nodules on limited exam.    Psychiatric: Normal judgment/insight. Normal mood and affect. Oriented x3.    No results found for this or any previous visit (from the past 72 hour(s)).  No results found.   ASSESSMENT/PLAN:   1. Annual physical exam Checking CBC, CMP, and Lipid panel. Declined colonoscopy and pap smear. Recommend regular dental care.  - CBC - COMPLETE METABOLIC PANEL WITH GFR - Lipid panel  2. Tinnitus of both ears Had a full workup and evaluation in 2019 without a cause identified. Referring back to ENT for recheck. - Ambulatory referral to ENT  3. Hypertension associated with diabetes ([place]) Continue Amlodipine and quinapril-HCTZ as prescribed. Continue limiting dietary sodium.  4. Hyperlipidemia associated with type 2 diabetes mellitus ([facility]) Continue Crestor 40m daily. Checking lipid panel. - Lipid panel  5. Type 2 diabetes mellitus with microalbuminuria, with long-term current use of insulin ([facility]) Continue Tresiba, Ozempic, and  Metformin as prescribed. Refills of Tresiba sent to pharmacy. Checking HgbA1c and other labs today. - CBC - COMPLETE METABOLIC PANEL WITH GFR - Lipid panel - Hemoglobin A1c - insulin degludec (TRESIBA FLEXTOUCH) 200 UNIT/ML FlexTouch Pen; Inject 50-80 Units into the skin at bedtime. Increase by 2 units every 3 days until FBS consistently 90-130 then stay at that dose.  Dispense: 15 mL; Refill: 11  6. History of iron deficiency anemia Checking iron panel. Continue ferrous sulfate 3249mtwice daily. - Fe+TIBC+Fer  7. Post-viral cough syndrome Gradually  improving. Ok to continue cough syrup and Mucinex as needed. Discussed how irritation from coughing can cause further coughing.   8. Depression with anxiety Continue Zoloft $RemoveBeforeD'100mg'LBWrfttzOUnVtc$  daily. Patient reports worsening of symptoms is transient and related to her recent illness (worry about missing work, being able to pay bills, etc). Episodes the last 2 nights sound like panic attacks. 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 Deep breathing effective so recommend working on this and being vigilant for potential triggers. If episodes continue or become more frequent, advised return to discuss with PCP.  9. Encounter for screening for HIV Discussed screening recommendations. Patient agreeable so adding to blood work today. - HIV Antibody (routine testing w rflx)   Orders Placed This Encounter  Procedures  . HIV Antibody (routine testing w rflx)  . CBC  . COMPLETE METABOLIC PANEL WITH GFR  . Fe+TIBC+Fer  . Lipid panel  . Hemoglobin A1c  . Ambulatory referral to ENT    Meds ordered this encounter  Medications  . insulin degludec (TRESIBA FLEXTOUCH) 200 UNIT/ML FlexTouch Pen    Sig: Inject 50-80 Units into the skin at bedtime. Increase by 2 units every 3 days until FBS consistently 90-130 then stay at that dose.    Dispense:  15 mL    Refill:  11    Order Specific Question:    Supervising Provider    Answer:   Emeterio Reeve [phone]    Patient Instructions  Preventive Care 9-38 Years Old, Female Preventive care refers to visits with your health care provider and lifestyle choices that can promote health and wellness. This includes:  A yearly physical exam. This may also be called an annual well check.  Regular dental visits and eye exams.  Immunizations.  Screening for certain conditions.  Healthy lifestyle choices, such as eating a healthy diet, getting regular exercise, not using drugs or products that contain nicotine and tobacco, and limiting alcohol use. What can I expect for my preventive care visit? Physical exam Your health care provider will check your:  Height and weight. This may be used to calculate body mass index (BMI), which tells if you are at a healthy weight.  Heart rate and blood pressure.  Skin for abnormal spots. Counseling Your health care provider may ask you questions about your:  Alcohol, tobacco, and drug use.  Emotional well-being.  Home and relationship well-being.  Sexual activity.  Eating habits.  Work and work Statistician.  Method of birth control.  Menstrual cycle.  Pregnancy history. What immunizations do I need?  Influenza (flu) vaccine  This is recommended every year. Tetanus, diphtheria, and pertussis (Tdap) vaccine  You may need a Td booster every 10 years. Varicella (chickenpox) vaccine  You may need this if you have not been vaccinated. Zoster (shingles) vaccine  You may need this after age 48. Measles, mumps, and rubella (MMR) vaccine  You may need at least one dose of MMR if you were born in 1957 or later. You may also need a second dose. Pneumococcal conjugate (PCV13) vaccine  You may need this if you have certain conditions and were not previously vaccinated. Pneumococcal polysaccharide (PPSV23) vaccine  You may need one or two doses if you smoke cigarettes or if you  have certain conditions. Meningococcal conjugate (MenACWY) vaccine  You may need this if you have certain conditions. Hepatitis A vaccine  You may need this if you have certain conditions or if you  travel or work in places where you may be exposed to hepatitis A. Hepatitis B vaccine  You may need this if you have certain conditions or if you travel or work in places where you may be exposed to hepatitis B. Haemophilus influenzae type b (Hib) vaccine  You may need this if you have certain conditions. Human papillomavirus (HPV) vaccine  If recommended by your health care provider, you may need three doses over 6 months. You may receive vaccines as individual doses or as more than one vaccine together in one shot (combination vaccines). Talk with your health care provider about the risks and benefits of combination vaccines. What tests do I need? Blood tests  Lipid and cholesterol levels. These may be checked every 5 years, or more frequently if you are over 54 years old.  Hepatitis C test.  Hepatitis B test. Screening  Lung cancer screening. You may have this screening every year starting at age 62 if you have a 30-pack-year history of smoking and currently smoke or have quit within the past 15 years.  Colorectal cancer screening. All adults should have this screening starting at age 40 and continuing until age 81. Your health care provider may recommend screening at age 23 if you are at increased risk. You will have tests every 1-10 years, depending on your results and the type of screening test.  Diabetes screening. This is done by checking your blood sugar (glucose) after you have not eaten for a while (fasting). You may have this done every 1-3 years.  Mammogram. This may be done every 1-2 years. Talk with your health care provider about when you should start having regular mammograms. This may depend on whether you have a family history of breast cancer.  BRCA-related cancer  screening. This may be done if you have a family history of breast, ovarian, tubal, or peritoneal cancers.  Pelvic exam and Pap test. This may be done every 3 years starting at age 31. Starting at age 22, this may be done every 5 years if you have a Pap test in combination with an HPV test. Other tests  Sexually transmitted disease (STD) testing.  Bone density scan. This is done to screen for osteoporosis. You may have this scan if you are at high risk for osteoporosis. Follow these instructions at home: Eating and drinking  Eat a diet that includes fresh fruits and vegetables, whole grains, lean protein, and low-fat dairy.  Take vitamin and mineral supplements as recommended by your health care provider.  Do not drink alcohol if: ? Your health care provider tells you not to drink. ? You are pregnant, may be pregnant, or are planning to become pregnant.  If you drink alcohol: ? Limit how much you have to 0-1 drink a day. ? Be aware of how much alcohol is in your drink. In the [place] one drink equals one 12 oz bottle of beer (355 mL), one 5 oz glass of wine (148 mL), or one 1 oz glass of hard liquor (44 mL). Lifestyle  Take daily care of your teeth and gums.  Stay active. Exercise for at least 30 minutes on 5 or more days each week.  Do not use any products that contain nicotine or tobacco, such as cigarettes, e-cigarettes, and chewing tobacco. If you need help quitting, ask your health care provider.  If you are sexually active, practice safe sex. Use a condom or other form of birth control (contraception) in order to prevent pregnancy and STIs (  sexually transmitted infections).  If told by your health care provider, take low-dose aspirin daily starting at age 66. What's next?  Visit your health care provider once a year for a well check visit.  Ask your health care provider how often you should have your eyes and teeth checked.  Stay up to date on all vaccines. This  information is not intended to replace advice given to you by your health care provider. Make sure you discuss any questions you have with your health care provider. Document Revised: 11/16/2017 Document Reviewed: 11/16/2017 Elsevier Patient Education  [place].  Follow-up plan: Return in about 3 months (around 02/13/2020) for DM follow up/A1c recheck.  Clearnce Sorrel, DNP, APRN, FNP-BC [facility]

## 2019-11-13 NOTE — Patient Instructions (Signed)
Preventive Care 15-62 Years Old, Female Preventive care refers to visits with your health care provider and lifestyle choices that can promote health and wellness. This includes:  A yearly physical exam. This may also be called an annual well check.  Regular dental visits and eye exams.  Immunizations.  Screening for certain conditions.  Healthy lifestyle choices, such as eating a healthy diet, getting regular exercise, not using drugs or products that contain nicotine and tobacco, and limiting alcohol use. What can I expect for my preventive care visit? Physical exam Your health care provider will check your:  Height and weight. This may be used to calculate body mass index (BMI), which tells if you are at a healthy weight.  Heart rate and blood pressure.  Skin for abnormal spots. Counseling Your health care provider may ask you questions about your:  Alcohol, tobacco, and drug use.  Emotional well-being.  Home and relationship well-being.  Sexual activity.  Eating habits.  Work and work Statistician.  Method of birth control.  Menstrual cycle.  Pregnancy history. What immunizations do I need?  Influenza (flu) vaccine  This is recommended every year. Tetanus, diphtheria, and pertussis (Tdap) vaccine  You may need a Td booster every 10 years. Varicella (chickenpox) vaccine  You may need this if you have not been vaccinated. Zoster (shingles) vaccine  You may need this after age 57. Measles, mumps, and rubella (MMR) vaccine  You may need at least one dose of MMR if you were born in 1957 or later. You may also need a second dose. Pneumococcal conjugate (PCV13) vaccine  You may need this if you have certain conditions and were not previously vaccinated. Pneumococcal polysaccharide (PPSV23) vaccine  You may need one or two doses if you smoke cigarettes or if you have certain conditions. Meningococcal conjugate (MenACWY) vaccine  You may need this if you  have certain conditions. Hepatitis A vaccine  You may need this if you have certain conditions or if you travel or work in places where you may be exposed to hepatitis A. Hepatitis B vaccine  You may need this if you have certain conditions or if you travel or work in places where you may be exposed to hepatitis B. Haemophilus influenzae type b (Hib) vaccine  You may need this if you have certain conditions. Human papillomavirus (HPV) vaccine  If recommended by your health care provider, you may need three doses over 6 months. You may receive vaccines as individual doses or as more than one vaccine together in one shot (combination vaccines). Talk with your health care provider about the risks and benefits of combination vaccines. What tests do I need? Blood tests  Lipid and cholesterol levels. These may be checked every 5 years, or more frequently if you are over 57 years old.  Hepatitis C test.  Hepatitis B test. Screening  Lung cancer screening. You may have this screening every year starting at age 81 if you have a 30-pack-year history of smoking and currently smoke or have quit within the past 15 years.  Colorectal cancer screening. All adults should have this screening starting at age 41 and continuing until age 45. Your health care provider may recommend screening at age 86 if you are at increased risk. You will have tests every 1-10 years, depending on your results and the type of screening test.  Diabetes screening. This is done by checking your blood sugar (glucose) after you have not eaten for a while (fasting). You may have this  done every 1-3 years.  Mammogram. This may be done every 1-2 years. Talk with your health care provider about when you should start having regular mammograms. This may depend on whether you have a family history of breast cancer.  BRCA-related cancer screening. This may be done if you have a family history of breast, ovarian, tubal, or peritoneal  cancers.  Pelvic exam and Pap test. This may be done every 3 years starting at age 78. Starting at age 86, this may be done every 5 years if you have a Pap test in combination with an HPV test. Other tests  Sexually transmitted disease (STD) testing.  Bone density scan. This is done to screen for osteoporosis. You may have this scan if you are at high risk for osteoporosis. Follow these instructions at home: Eating and drinking  Eat a diet that includes fresh fruits and vegetables, whole grains, lean protein, and low-fat dairy.  Take vitamin and mineral supplements as recommended by your health care provider.  Do not drink alcohol if: ? Your health care provider tells you not to drink. ? You are pregnant, may be pregnant, or are planning to become pregnant.  If you drink alcohol: ? Limit how much you have to 0-1 drink a day. ? Be aware of how much alcohol is in your drink. In the [place] one drink equals one 12 oz bottle of beer (355 mL), one 5 oz glass of wine (148 mL), or one 1 oz glass of hard liquor (44 mL). Lifestyle  Take daily care of your teeth and gums.  Stay active. Exercise for at least 30 minutes on 5 or more days each week.  Do not use any products that contain nicotine or tobacco, such as cigarettes, e-cigarettes, and chewing tobacco. If you need help quitting, ask your health care provider.  If you are sexually active, practice safe sex. Use a condom or other form of birth control (contraception) in order to prevent pregnancy and STIs (sexually transmitted infections).  If told by your health care provider, take low-dose aspirin daily starting at age 44. What's next?  Visit your health care provider once a year for a well check visit.  Ask your health care provider how often you should have your eyes and teeth checked.  Stay up to date on all vaccines. This information is not intended to replace advice given to you by your health care provider. Make sure you  discuss any questions you have with your health care provider. Document Revised: 11/16/2017 Document Reviewed: 11/16/2017 Elsevier Patient Education  2020 [employer].

## 2019-11-14 ENCOUNTER — Other Ambulatory Visit: Payer: Self-pay | Admitting: Medical-Surgical

## 2019-11-14 ENCOUNTER — Encounter: Payer: Self-pay | Admitting: Medical-Surgical

## 2019-11-14 ENCOUNTER — Telehealth: Payer: Self-pay

## 2019-11-14 LAB — COMPLETE METABOLIC PANEL WITH GFR
AG Ratio: 1.3 (calc) (ref 1.0–2.5)
ALT: 18 U/L (ref 6–29)
AST: 25 U/L (ref 10–35)
Albumin: 3.5 g/dL — ABNORMAL LOW (ref 3.6–5.1)
Alkaline phosphatase (APISO): 66 U/L (ref 37–153)
BUN: 21 mg/dL (ref 7–25)
CO2: 29 mmol/L (ref 20–32)
Calcium: 8.3 mg/dL — ABNORMAL LOW (ref 8.6–10.4)
Chloride: 92 mmol/L — ABNORMAL LOW (ref 98–110)
Creat: 0.85 mg/dL (ref 0.50–0.99)
GFR, Est African American: 85 mL/min/{1.73_m2} (ref >=60)
GFR, Est Non African American: 73 mL/min/{1.73_m2} (ref >=60)
Globulin: 2.7 g/dL (calc) (ref 1.9–3.7)
Glucose, Bld: 167 mg/dL — ABNORMAL HIGH (ref 65–99)
Potassium: 3.4 mmol/L — ABNORMAL LOW (ref 3.5–5.3)
Sodium: 130 mmol/L — ABNORMAL LOW (ref 135–146)
Total Bilirubin: 0.6 mg/dL (ref 0.2–1.2)
Total Protein: 6.2 g/dL (ref 6.1–8.1)

## 2019-11-14 LAB — IRON,TIBC AND FERRITIN PANEL
%SAT: 11 % (calc) — ABNORMAL LOW (ref 16–45)
Ferritin: 78 ng/mL (ref 16–288)
Iron: 41 ug/dL — ABNORMAL LOW (ref 45–160)
TIBC: 362 mcg/dL (calc) (ref 250–450)

## 2019-11-14 LAB — CBC
HCT: 38.3 % (ref 35.0–45.0)
Hemoglobin: 12.4 g/dL (ref 11.7–15.5)
MCH: 24.8 pg — ABNORMAL LOW (ref 27.0–33.0)
MCHC: 32.4 g/dL (ref 32.0–36.0)
MCV: 76.6 fL — ABNORMAL LOW (ref 80.0–100.0)
MPV: 9.1 fL (ref 7.5–12.5)
Platelets: 307 10*3/uL (ref 140–400)
RBC: 5 10*6/uL (ref 3.80–5.10)
RDW: 14.1 % (ref 11.0–15.0)
WBC: 7.6 10*3/uL (ref 3.8–10.8)

## 2019-11-14 LAB — LIPID PANEL
Cholesterol: 110 mg/dL (ref <200)
HDL: 26 mg/dL — ABNORMAL LOW (ref >=50)
LDL Cholesterol (Calc): 56 mg/dL (calc)
Non-HDL Cholesterol (Calc): 84 mg/dL (calc) (ref <130)
Total CHOL/HDL Ratio: 4.2 (calc) (ref <5.0)
Triglycerides: 215 mg/dL — ABNORMAL HIGH (ref <150)

## 2019-11-14 LAB — HEMOGLOBIN A1C
Hgb A1c MFr Bld: 7.9 % of total Hgb — ABNORMAL HIGH (ref <5.7)
Mean Plasma Glucose: 180 (calc)
eAG (mmol/L): 10 (calc)

## 2019-11-14 LAB — HIV ANTIBODY (ROUTINE TESTING W REFLEX): HIV 1&2 Ab, 4th Generation: NONREACTIVE

## 2019-11-14 MED ORDER — AMOXICILLIN-POT CLAVULANATE 875-125 MG PO TABS: 1.0000 | ORAL_TABLET | Freq: Two times a day (BID) | ORAL | 0 refills | Status: DC

## 2019-11-14 MED FILL — AMOX-CLAV 875-125 MG TABLET: 875-125 | 5 days supply | Qty: 10 | Fill #0

## 2019-11-14 NOTE — Addendum Note (Signed)
Addended byChristen Butter on: 11/14/2019 09:48 AM   Modules accepted: Orders

## 2019-11-14 NOTE — Telephone Encounter (Signed)
While green nasal drainage can be disturbing, it does not necessarily indicate an actual infection. Given that her symptoms originally started about two weeks ago and she still has some lingering cough and such, I will go ahead and send in a short course of antibiotics. With her being diabetic with an elevated A1c, erring on the side of caution is prudent.

## 2019-11-14 NOTE — Telephone Encounter (Signed)
Pt called and stated that she had talked about the cough she has been having during her OV on 11/13/2019 and was told if anything new happened to call back. Pt states at work last night she started having nasal congestion and that when she would blow her nose green-brown mucus would come out. Pt is wanting to know if an atbx could be called in for her.   When responding to pt, please send a portal message due to her work schedule.

## 2019-11-15 NOTE — Telephone Encounter (Signed)
Patient advised.

## 2019-12-17 ENCOUNTER — Encounter: Payer: Self-pay | Admitting: Medical-Surgical

## 2019-12-18 ENCOUNTER — Ambulatory Visit (INDEPENDENT_AMBULATORY_CARE_PROVIDER_SITE_OTHER): Payer: No Typology Code available for payment source | Admitting: Medical-Surgical

## 2019-12-18 ENCOUNTER — Encounter: Payer: Self-pay | Admitting: Medical-Surgical

## 2019-12-18 ENCOUNTER — Other Ambulatory Visit: Payer: Self-pay

## 2019-12-18 VITALS — BP 126/78 | HR 72 | Temp 98.1°F | Ht 59.0 in | Wt 234.1 lb

## 2019-12-18 DIAGNOSIS — N898 Other specified noninflammatory disorders of vagina: Secondary | ICD-10-CM

## 2019-12-18 DIAGNOSIS — Z23 Encounter for immunization: Secondary | ICD-10-CM | POA: Diagnosis not present

## 2019-12-18 LAB — WET PREP FOR TRICH, YEAST, CLUE
MICRO NUMBER:: 11009913
Specimen Quality: ADEQUATE

## 2019-12-18 MED ORDER — CLOTRIMAZOLE 1 % EX CREA: 1.0000 "application " | TOPICAL_CREAM | Freq: Two times a day (BID) | CUTANEOUS | 0 refills | Status: DC

## 2019-12-18 MED ORDER — FLUCONAZOLE 150 MG PO TABS: ORAL_TABLET | ORAL | 0 refills | Status: DC

## 2019-12-18 MED FILL — METFORMIN HCL 1000 MG TABS: 1000 | 90 days supply | Qty: 180 | Fill #3

## 2019-12-18 MED FILL — FLUCONAZOLE 150 MG TABS: 150 | 3 days supply | Qty: 2 | Fill #0

## 2019-12-18 MED FILL — ANTIFUNGAL CLOTRIMAZOLE 1 %: 1 | 14 days supply | Qty: 28 | Fill #0

## 2019-12-18 MED FILL — TRESIBA FLEXTOUCH 200 UNITS: 200 | 20 days supply | Qty: 9 | Fill #1

## 2019-12-18 NOTE — Patient Instructions (Signed)

## 2019-12-18 NOTE — Progress Notes (Signed)
Subjective:    CC: vaginal itching  HPI: Pleasant 63 year old female presenting for evaluation of vaginal itching for the past 2-3 weeks. Originally thought it was a UTI so she drank lots of water and cranberry juice which was not helpful. Tried using AZO but that did not help either. Remembered that she did have a course of antibiotics last month for an upper respiratory infection. Has had a small vaginal discharge that she can feel but has not been able to see. Some urinary burning last week but better this week. Has some urgency but when she does, doesn't go a lot. No fever, chills, vaginal odor, frequency, or urinary odor. Not sexually active. Has been scratching the pubic area, even in her sleep and has developed some small red bumps in her pubic hair.   I reviewed the past medical history, family history, social history, surgical history, and allergies today and no changes were needed.  Please see the problem list section below in epic for further details.  Past Medical History: Past Medical History:  Diagnosis Date  . Anemia   . Anxiety   . Arthritis   . Carpal tunnel syndrome   . Chronic headaches   . Depression   . Diabetes mellitus without complication (HCC)   . History of vitamin D deficiency   . Hyperlipidemia   . Hypertension   . Obesity   . 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 Tinnitus aurium, bilateral    Past Surgical History: Past Surgical History:  Procedure Laterality Date  . APPENDECTOMY    . CARDIAC CATHETERIZATION     Negative. Dx was Anxiety  . HERNIA REPAIR    . LEG SURGERY    . TUBAL LIGATION     Social History: Social History   Socioeconomic History  . Marital status: Divorced    Spouse name: Not on file  . Number of children: Not on file  . Years of education: Not on file  . Highest education level: Not on file  Occupational History  . Not on file  Tobacco Use   . Smoking status: Never Smoker  . Smokeless tobacco: Never Used  Vaping Use  . Vaping Use: Never used  Substance and Sexual Activity  . Alcohol use: No  . Drug use: Never  . Sexual activity: Not Currently    Birth control/protection: None, Post-menopausal, Abstinence  Other Topics Concern  . Not on file  Social History Narrative  . Not on file   Social Determinants of Health   Financial Resource Strain:   . Difficulty of Paying Living Expenses: Not on file  Food Insecurity:   . Worried About Programme researcher, broadcasting/film/video in the Last Year: Not on file  . Ran Out of Food in the Last Year: Not on file  Transportation Needs:   . Lack of Transportation (Medical): Not on file  . Lack of Transportation (Non-Medical): Not on file  Physical Activity:   . Days of Exercise per Week: Not on file  . Minutes of Exercise per Session: Not on file  Stress:   . Feeling of Stress : Not on file  Social Connections:   . Frequency of Communication with Friends and Family: Not on file  . Frequency of Social Gatherings with Friends and Family: Not on file  . Attends Religious Services: Not on file  . Active Member of Clubs or Organizations: Not on file  .  Attends Banker Meetings: Not on file  . Marital Status: Not on file   Family History: Family History  Problem Relation Age of Onset  . Hyperlipidemia Mother   . Hypertension Mother   . Stroke Father   . Thyroid cancer Sister   . Diabetes Maternal Aunt   . Skin cancer Maternal Uncle    Allergies: Allergies  Allergen Reactions  . Aspirin     Bruising with long term use   Medications: See med rec.  Review of Systems: No fevers, chills, night sweats, weight loss, chest pain, or shortness of breath.   Objective:    General: Well Developed, well nourished, and in no acute distress.  Neuro: Alert and oriented x3.  HEENT: Normocephalic, atraumatic.  Skin: Warm and dry. See pelvic exam.  Cardiac: Regular rate and rhythm, no  murmurs rubs or gallops, no lower extremity edema.  Respiratory: Clear to auscultation bilaterally. Not using accessory muscles, speaking in full sentences. Pelvic exam: normal external genitalia, vulva, vagina, cervix, uterus and adnexa, VULVA: vulvar excoriation, vulvar erythema, vulvar lesion, WET MOUNT done - results: pending lab report, exam chaperoned by Ihor Gully, MA.  Impression and Recommendations:    1. Vaginal itching STAT wet prep. Presentation consistent with vulvovaginal candidiasis but want to make sure no BV as well. Treating with Diflucan orally. Sending in clotrimazole for topical use BID. Avoid scratching as much as possible.  - WET PREP FOR TRICH, YEAST, CLUE  2. Need for influenza vaccination Flu vaccine given in office today.  - Flu Vaccine QUAD 36+ mos IM  Return if symptoms worsen or fail to improve.  ___________________________________________ Thayer Ohm, DNP, APRN, FNP-BC Primary Care and Sports Medicine [facility] [place]

## 2019-12-21 ENCOUNTER — Encounter (HOSPITAL_BASED_OUTPATIENT_CLINIC_OR_DEPARTMENT_OTHER): Payer: Self-pay

## 2019-12-21 ENCOUNTER — Emergency Department (HOSPITAL_BASED_OUTPATIENT_CLINIC_OR_DEPARTMENT_OTHER)
Admission: EM | Admit: 2019-12-21 | Discharge: 2019-12-21 | Disposition: A | Discharge status: Home / Self Care | Payer: No Typology Code available for payment source | Attending: Emergency Medicine | Admitting: Emergency Medicine

## 2019-12-21 ENCOUNTER — Other Ambulatory Visit: Payer: Self-pay

## 2019-12-21 ENCOUNTER — Telehealth (HOSPITAL_BASED_OUTPATIENT_CLINIC_OR_DEPARTMENT_OTHER): Payer: Self-pay | Admitting: Emergency Medicine

## 2019-12-21 DIAGNOSIS — I10 Essential (primary) hypertension: Secondary | ICD-10-CM | POA: Insufficient documentation

## 2019-12-21 DIAGNOSIS — R222 Localized swelling, mass and lump, trunk: Secondary | ICD-10-CM | POA: Diagnosis present

## 2019-12-21 DIAGNOSIS — E119 Type 2 diabetes mellitus without complications: Secondary | ICD-10-CM | POA: Insufficient documentation

## 2019-12-21 DIAGNOSIS — N611 Abscess of the breast and nipple: Secondary | ICD-10-CM | POA: Diagnosis not present

## 2019-12-21 DIAGNOSIS — L0291 Cutaneous abscess, unspecified: Secondary | ICD-10-CM

## 2019-12-21 MED ORDER — PENTAFLUOROPROP-TETRAFLUOROETH EX AERO
INHALATION_SPRAY | CUTANEOUS | Status: AC
  Administered 2019-12-21: 30 via TOPICAL
  Filled 2019-12-21: qty 30

## 2019-12-21 MED ORDER — FLUCONAZOLE 150 MG PO TABS: ORAL_TABLET | ORAL | 0 refills | Status: DC

## 2019-12-21 MED ORDER — SULFAMETHOXAZOLE-TRIMETHOPRIM 800-160 MG PO TABS: 1.0000 | ORAL_TABLET | Freq: Two times a day (BID) | ORAL | 0 refills | Status: AC

## 2019-12-21 MED ORDER — LIDOCAINE HCL (PF) 1 % IJ SOLN
5.0000 mL | Freq: Once | INTRAMUSCULAR | Status: AC
  Administered 2019-12-21: 5 mL
  Filled 2019-12-21: qty 5

## 2019-12-21 MED ORDER — PENTAFLUOROPROP-TETRAFLUOROETH EX AERO: INHALATION_SPRAY | CUTANEOUS | Status: DC | PRN

## 2019-12-21 NOTE — ED Notes (Signed)
I&D tray at bedside.

## 2019-12-21 NOTE — ED Notes (Signed)
Freeze spray and lidocaine at bedside for EDP

## 2019-12-21 NOTE — ED Provider Notes (Signed)
[facility] Emergency Department Provider Note MRN:  [id number]  Arrival date & time: 12/21/19     Chief Complaint   Abscess   History of Present Illness   Jessica [name] is a 64 y.o. year-old female with a history of diabetes presenting to the ED with chief complaint of abscess.  Pain and swelling to a small area of the right anterior chest, began 1 week ago while she was outside with her grandchildren.  Unsure how it started, thinks maybe she was bit by a bug or spider.  Drained purulent material few days ago, but now seems to be worsening again.  Denies fever, no other complaints.  Review of Systems  A problem-focused ROS was performed. Positive for abscess.  Patient denies fever.  Patient's Health History    Past Medical History:  Diagnosis Date  . Anemia   . Anxiety   . Arthritis   . Carpal tunnel syndrome   . Chronic headaches   . Depression   . Diabetes mellitus without complication (HCC)   . History of vitamin D deficiency   . Hyperlipidemia   . Hypertension   . Obesity   . 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 Tinnitus aurium, bilateral     Past Surgical History:  Procedure Laterality Date  . APPENDECTOMY    . CARDIAC CATHETERIZATION     Negative. Dx was Anxiety  . HERNIA REPAIR    . LEG SURGERY    . TUBAL LIGATION      Family History  Problem Relation Age of Onset  . Hyperlipidemia Mother   . Hypertension Mother   . Stroke Father   . Thyroid cancer Sister   . Diabetes Maternal Aunt   . Skin cancer Maternal Uncle     Social History   Socioeconomic History  . Marital status: Divorced    Spouse name: Not on file  . Number of children: Not on file  . Years of education: Not on file  . Highest education level: Not on file  Occupational History  . Not on file  Tobacco Use  . Smoking status: Never Smoker  . Smokeless tobacco:  Never Used  Vaping Use  . Vaping Use: Never used  Substance and Sexual Activity  . Alcohol use: No  . Drug use: Never  . Sexual activity: Not Currently    Birth control/protection: None, Post-menopausal, Abstinence  Other Topics Concern  . Not on file  Social History Narrative  . Not on file   Social Determinants of Health   Financial Resource Strain:   . Difficulty of Paying Living Expenses: Not on file  Food Insecurity:   . Worried About Programme researcher, broadcasting/film/video in the Last Year: Not on file  . Ran Out of Food in the Last Year: Not on file  Transportation Needs:   . Lack of Transportation (Medical): Not on file  . Lack of Transportation (Non-Medical): Not on file  Physical Activity:   . Days of Exercise per Week: Not on file  . Minutes of Exercise per Session: Not on file  Stress:   . Feeling of Stress : Not on file  Social Connections:   . Frequency of Communication with Friends and Family: Not on file  . Frequency of Social Gatherings with Friends and Family: Not on file  . Attends Religious Services: Not on file  . Active Member of Clubs  or Organizations: Not on file  . Attends Banker Meetings: Not on file  . Marital Status: Not on file  Intimate Partner Violence:   . Fear of Current or Ex-Partner: Not on file  . Emotionally Abused: Not on file  . Physically Abused: Not on file  . Sexually Abused: Not on file     Physical Exam   Vitals:   12/21/19 0750  BP: (!) 174/79  Pulse: 83  Resp: 18  Temp: 98.6 F (37 C)  SpO2: 100%    CONSTITUTIONAL: Well-appearing, NAD NEURO:  Alert and oriented x 3, no focal deficits EYES:  eyes equal and reactive ENT/NECK:  no LAD, no JVD CARDIO: Regular rate, well-perfused, normal S1 and S2 PULM:  CTAB no wheezing or rhonchi GI/GU:  normal bowel sounds, non-distended, non-tender MSK/SPINE:  No gross deformities, no edema SKIN: Scabbed wound to the right anterior chest with mild fluctuance and 5 cm of surrounding  erythema PSYCH:  Appropriate speech and behavior  *Additional and/or pertinent findings included in MDM below  Diagnostic and Interventional Summary    EKG Interpretation  Date/Time:    Ventricular Rate:    PR Interval:    QRS Duration:   QT Interval:    QTC Calculation:   R Axis:     Text Interpretation:        Labs Reviewed - No data to display  No orders to display    Medications  pentafluoroprop-tetrafluoroeth (GEBAUERS) aerosol (30 application Topical Given by Other 12/21/19 0849)  lidocaine (PF) (XYLOCAINE) 1 % injection 5 mL (5 mLs Other Given by Other 12/21/19 0847)     Procedures  /  Critical Care .Marland KitchenIncision and Drainage  Date/Time: 12/21/2019 9:43 AM Performed by: Sabas Sous, MD Authorized by: Sabas Sous, MD   Consent:    Consent obtained:  Verbal   Consent given by:  Patient   Risks discussed:  Damage to other organs, bleeding, incomplete drainage, infection and pain Location:    Type:  Abscess   Size:  2cm   Location:  Trunk   Trunk location:  R breast Pre-procedure details:    Skin preparation:  Chloraprep Anesthesia (see MAR for exact dosages):    Anesthesia method:  Local infiltration   Local anesthetic:  Lidocaine 1% w/o epi Procedure type:    Complexity:  Complex Procedure details:    Incision types:  Cruciate   Incision depth:  Submucosal   Scalpel blade:  11   Wound management:  Probed and deloculated, irrigated with saline and debrided   Drainage:  Bloody and purulent   Drainage amount:  Moderate   Wound treatment:  Wound left open   Packing materials:  None Post-procedure details:    Patient tolerance of procedure:  Tolerated well, no immediate complications    ED Course and Medical Decision Making  I have reviewed the triage vital signs, the nursing notes, and pertinent available records from the EMR.  Listed above are laboratory and imaging tests that I personally ordered, reviewed, and interpreted and then considered  in my medical decision making (see below for details).  Suspect abscess with surrounding cellulitis, will perform incision and drainage and provide outpatient antibiotics.  No systemic symptoms.       Elmer Sow. Pilar Plate, MD [facility] Emergency Medicine [facility] [email or web address]  Final Clinical Impressions(s) / ED Diagnoses     ICD-10-CM   1. Abscess  L02.91     ED Discharge Orders  Ordered    sulfamethoxazole-trimethoprim (BACTRIM DS) 800-160 MG tablet  2 times daily        12/21/19 0942    fluconazole (DIFLUCAN) 150 MG tablet        12/21/19 6295           Discharge Instructions Discussed with and Provided to Patient:     Discharge Instructions     You were evaluated in the Emergency Department and after careful evaluation, we did not find any emergent condition requiring admission or further testing in the hospital.  Your exam/testing today is overall reassuring.  Your symptoms seem to be due to an abscess, which we drained here in the emergency department.  Please take the Bactrim antibiotics as prescribed, use Tylenol or Motrin for discomfort.  Please return to the Emergency Department if you experience any worsening of your condition.   Thank you for allowing [place] to be a part of your care.       Sabas Sous, MD 12/21/19 [phone]

## 2019-12-21 NOTE — ED Triage Notes (Signed)
Pt arrives stating she has an abscess to upper right chest. Believes its from a spider bite, redness noted with drainage. Started a week ago.

## 2019-12-21 NOTE — ED Notes (Signed)
Pharmacy and medications updated with patient 

## 2019-12-21 NOTE — Discharge Instructions (Addendum)
You were evaluated in the Emergency Department and after careful evaluation, we did not find any emergent condition requiring admission or further testing in the hospital.  Your exam/testing today is overall reassuring.  Your symptoms seem to be due to an abscess, which we drained here in the emergency department.  Please take the Bactrim antibiotics as prescribed, use Tylenol or Motrin for discomfort.  Please return to the Emergency Department if you experience any worsening of your condition.   Thank you for allowing [place] to be a part of your care.

## 2019-12-26 ENCOUNTER — Encounter: Payer: Self-pay | Admitting: Medical-Surgical

## 2020-01-02 MED FILL — QUINAPRIL-HCTZ 20-12.5 MG T: 20-12.5 | 90 days supply | Qty: 180 | Fill #3

## 2020-01-16 MED FILL — OZEMPIC 0.25 OR 0.5 MG/DOSE: 2 | 84 days supply | Qty: 5 | Fill #1

## 2020-01-17 MED FILL — TRESIBA FLEXTOUCH 200 UNITS: 200 | 20 days supply | Qty: 9 | Fill #2

## 2020-02-17 ENCOUNTER — Ambulatory Visit: Payer: No Typology Code available for payment source | Admitting: Medical-Surgical

## 2020-02-24 ENCOUNTER — Encounter: Payer: Self-pay | Admitting: Medical-Surgical

## 2020-02-24 ENCOUNTER — Other Ambulatory Visit: Payer: Self-pay

## 2020-02-24 ENCOUNTER — Other Ambulatory Visit (HOSPITAL_BASED_OUTPATIENT_CLINIC_OR_DEPARTMENT_OTHER): Payer: Self-pay | Admitting: Medical-Surgical

## 2020-02-24 ENCOUNTER — Ambulatory Visit (INDEPENDENT_AMBULATORY_CARE_PROVIDER_SITE_OTHER): Payer: No Typology Code available for payment source | Admitting: Medical-Surgical

## 2020-02-24 VITALS — BP 158/69 | HR 71 | Ht 59.0 in | Wt 227.0 lb

## 2020-02-24 DIAGNOSIS — Z794 Long term (current) use of insulin: Secondary | ICD-10-CM | POA: Diagnosis not present

## 2020-02-24 DIAGNOSIS — E785 Hyperlipidemia, unspecified: Secondary | ICD-10-CM

## 2020-02-24 DIAGNOSIS — F330 Major depressive disorder, recurrent, mild: Secondary | ICD-10-CM

## 2020-02-24 DIAGNOSIS — E1169 Type 2 diabetes mellitus with other specified complication: Secondary | ICD-10-CM | POA: Diagnosis not present

## 2020-02-24 DIAGNOSIS — E1159 Type 2 diabetes mellitus with other circulatory complications: Secondary | ICD-10-CM

## 2020-02-24 DIAGNOSIS — E871 Hypo-osmolality and hyponatremia: Secondary | ICD-10-CM

## 2020-02-24 DIAGNOSIS — E1129 Type 2 diabetes mellitus with other diabetic kidney complication: Secondary | ICD-10-CM

## 2020-02-24 DIAGNOSIS — R809 Proteinuria, unspecified: Secondary | ICD-10-CM

## 2020-02-24 DIAGNOSIS — I152 Hypertension secondary to endocrine disorders: Secondary | ICD-10-CM

## 2020-02-24 LAB — POCT GLYCOSYLATED HEMOGLOBIN (HGB A1C): Hemoglobin A1C: 7.6 % — AB (ref 4.0–5.6)

## 2020-02-24 MED ORDER — ROSUVASTATIN CALCIUM 40 MG PO TABS: 40.0000 mg | ORAL_TABLET | Freq: Every day | ORAL | 3 refills | Status: DC

## 2020-02-24 MED ORDER — QUINAPRIL-HYDROCHLOROTHIAZIDE 20-12.5 MG PO TABS: 2.0000 | ORAL_TABLET | Freq: Every day | ORAL | 3 refills | Status: DC

## 2020-02-24 MED ORDER — METFORMIN HCL 1000 MG PO TABS: 1000.0000 mg | ORAL_TABLET | Freq: Two times a day (BID) | ORAL | 3 refills | Status: DC

## 2020-02-24 MED ORDER — OZEMPIC (0.25 OR 0.5 MG/DOSE) 2 MG/1.5ML ~~LOC~~ SOPN: 0.5000 mg | PEN_INJECTOR | SUBCUTANEOUS | 4 refills | Status: DC

## 2020-02-24 MED ORDER — SERTRALINE HCL 100 MG PO TABS: 100.0000 mg | ORAL_TABLET | Freq: Every day | ORAL | 1 refills | Status: DC

## 2020-02-24 MED ORDER — PEN NEEDLES 32G X 4 MM MISC: 1.0000 | Freq: Two times a day (BID) | 1 refills | Status: DC

## 2020-02-24 MED FILL — ULTICARE PEN NDL 4MM 32G: 32G X 4 MM | 50 days supply | Qty: 100 | Fill #0

## 2020-02-24 MED FILL — SERTRALINE HCL 100 MG TABS: 100 | 90 days supply | Qty: 90 | Fill #0

## 2020-02-24 MED FILL — ROSUVASTATIN CALCIUM 40 MG: 40 | 90 days supply | Qty: 90 | Fill #0

## 2020-02-24 NOTE — Progress Notes (Signed)
Subjective:    CC: DM/HTN/HLD follow up  HPI:  Pleasant 64 year old female presenting today for follow up of the following:  DM- last couple of days sugars have been as low as 80, gets shaky when down that low. Post-meal this morning 222. Taking Ozempic 0.5mg  weekly, missing occasional doses due to forgetting but takes as soon as she remembers. Evaristo Bury is 50 units nightly usually but she uses less times when her sugars are running lower. Has not been eating like she should due to worries over diarrhea, her work schedule, and taking care of her grandkids.  HTN- taking amlodipine 5mg  daily, missing doses because she forgets to take it.  Taking quinapril-HCTZ 40-25mg  daily. Has not had her medicines today. Denies CP, SOB, palpitations, lower extremity edema, dizziness, headaches, or vision changes.   HLD- taking Crestor 40mg  daily, tolerating well.   Mood- taking Zoloft 100mg  daily, tolerating well. Feels her symptoms are well controlled. Denies SI/HI.  I reviewed the past medical history, family history, social history, surgical history, and allergies today and no changes were needed.  Please see the problem list section below in epic for further details.  Past Medical History: Past Medical History:  Diagnosis Date  . Anemia   . Anxiety   . Arthritis   . Carpal tunnel syndrome   . Chronic headaches   . Depression   . Diabetes mellitus without complication (HCC)   . History of vitamin D deficiency   . Hyperlipidemia   . Hypertension   . Obesity   . 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 Tinnitus aurium, bilateral    Past Surgical History: Past Surgical History:  Procedure Laterality Date  . APPENDECTOMY    . CARDIAC CATHETERIZATION     Negative. Dx was Anxiety  . HERNIA REPAIR    . LEG SURGERY    . TUBAL LIGATION     Social History: Social History   Socioeconomic History  . Marital status:  Divorced    Spouse name: Not on file  . Number of children: Not on file  . Years of education: Not on file  . Highest education level: Not on file  Occupational History  . Not on file  Tobacco Use  . Smoking status: Never Smoker  . Smokeless tobacco: Never Used  Vaping Use  . Vaping Use: Never used  Substance and Sexual Activity  . Alcohol use: No  . Drug use: Never  . Sexual activity: Not Currently    Birth control/protection: None, Post-menopausal, Abstinence  Other Topics Concern  . Not on file  Social History Narrative  . Not on file   Social Determinants of Health   Financial Resource Strain:   . Difficulty of Paying Living Expenses: Not on file  Food Insecurity:   . Worried About 02/08/2007 in the Last Year: Not on file  . Ran Out of Food in the Last Year: Not on file  Transportation Needs:   . Lack of Transportation (Medical): Not on file  . Lack of Transportation (Non-Medical): Not on file  Physical Activity:   . Days of Exercise per Week: Not on file  . Minutes of Exercise per Session: Not on file  Stress:   . Feeling of Stress : Not on file  Social Connections:   . Frequency of Communication with Friends and Family: Not on file  . Frequency of Social Gatherings with Friends and  Family: Not on file  . Attends Religious Services: Not on file  . Active Member of Clubs or Organizations: Not on file  . Attends Banker Meetings: Not on file  . Marital Status: Not on file   Family History: Family History  Problem Relation Age of Onset  . Hyperlipidemia Mother   . Hypertension Mother   . Stroke Father   . Thyroid cancer Sister   . Diabetes Maternal Aunt   . Skin cancer Maternal Uncle    Allergies: Allergies  Allergen Reactions  . Aspirin     Bruising with long term use   Medications: See med rec.  Review of Systems: See HPI for pertinent positives and negatives.   Objective:    General: Well Developed, well nourished, and in  no acute distress.  Neuro: Alert and oriented x3.  HEENT: Normocephalic, atraumatic.  Skin: Warm and dry. Well healed scar to the right upper chest from previous skin abscess. Cardiac: Regular rate and rhythm, no murmurs rubs or gallops, no lower extremity edema.  Respiratory: Clear to auscultation bilaterally. Not using accessory muscles, speaking in full sentences.   Impression and Recommendations:    1. Type 2 diabetes mellitus with microalbuminuria, with long-term current use of insulin (HCC) POCT Hgb A1c 7.6%. Continue current medications. Refills provided. Continue dietary modifications and attempts at weight loss. Has lost 7lbs since 10/2.  - POCT HgB A1C - Insulin Pen Needle (PEN NEEDLES) 32G X 4 MM MISC; Inject 1 each into the skin 2 (two) times daily.  Dispense: 180 each; Refill: 1 - metFORMIN (GLUCOPHAGE) 1000 MG tablet; Take 1 tablet (1,000 mg total) by mouth 2 (two) times daily with a meal.  Dispense: 180 tablet; Refill: 3  2. Hypertension associated with diabetes (HCC) Continue Quinapril-HCTZ and Amlodipine as ordered. Strongly encouraged adherence to Amlodipine dosing. Checking labs. - quinapril-hydrochlorothiazide (ACCURETIC) 20-12.5 MG tablet; Take 2 tablets by mouth daily.  Dispense: 180 tablet; Refill: 3 - CBC  3. Hyperlipidemia associated with type 2 diabetes mellitus (HCC) Continue Crestor 40mg  daily. Checking lipid panel.  - rosuvastatin (CRESTOR) 40 MG tablet; Take 1 tablet (40 mg total) by mouth at bedtime.  Dispense: 90 tablet; Refill: 3 - Lipid panel  4. Hyponatremia Was supposed to have this rechecked back in August/September but did not. Rechecking CMP today.  - COMPLETE METABOLIC PANEL WITH GFR  5. Mild episode of recurrent major depressive disorder (HCC) Continue Zoloft 100mg  daily.  - sertraline (ZOLOFT) 100 MG tablet; Take 1 tablet (100 mg total) by mouth daily.  Dispense: 90 tablet; Refill: 1  Return in about 3 months (around 05/24/2020) for DM/HTN/HLD  follow up with PCP. ___________________________________________ , DNP, APRN, FNP-BC Primary Care and Sports Medicine [facility] [place]

## 2020-02-26 MED FILL — TRESIBA FLEXTOUCH 200 UNITS: 200 | 20 days supply | Qty: 9 | Fill #3

## 2020-03-07 ENCOUNTER — Other Ambulatory Visit: Payer: Self-pay

## 2020-03-07 ENCOUNTER — Encounter (HOSPITAL_BASED_OUTPATIENT_CLINIC_OR_DEPARTMENT_OTHER): Payer: Self-pay | Admitting: Emergency Medicine

## 2020-03-07 ENCOUNTER — Emergency Department (HOSPITAL_BASED_OUTPATIENT_CLINIC_OR_DEPARTMENT_OTHER)
Admission: EM | Admit: 2020-03-07 | Discharge: 2020-03-07 | Disposition: A | Discharge status: Home / Self Care | Payer: No Typology Code available for payment source | Attending: Emergency Medicine | Admitting: Emergency Medicine

## 2020-03-07 DIAGNOSIS — H66002 Acute suppurative otitis media without spontaneous rupture of ear drum, left ear: Secondary | ICD-10-CM | POA: Insufficient documentation

## 2020-03-07 DIAGNOSIS — Z794 Long term (current) use of insulin: Secondary | ICD-10-CM | POA: Insufficient documentation

## 2020-03-07 DIAGNOSIS — I10 Essential (primary) hypertension: Secondary | ICD-10-CM | POA: Insufficient documentation

## 2020-03-07 DIAGNOSIS — Z7984 Long term (current) use of oral hypoglycemic drugs: Secondary | ICD-10-CM | POA: Diagnosis not present

## 2020-03-07 DIAGNOSIS — Z79899 Other long term (current) drug therapy: Secondary | ICD-10-CM | POA: Insufficient documentation

## 2020-03-07 DIAGNOSIS — E1141 Type 2 diabetes mellitus with diabetic mononeuropathy: Secondary | ICD-10-CM | POA: Diagnosis not present

## 2020-03-07 DIAGNOSIS — L02811 Cutaneous abscess of head [any part, except face]: Secondary | ICD-10-CM | POA: Diagnosis not present

## 2020-03-07 DIAGNOSIS — Z7982 Long term (current) use of aspirin: Secondary | ICD-10-CM | POA: Insufficient documentation

## 2020-03-07 DIAGNOSIS — H9202 Otalgia, left ear: Secondary | ICD-10-CM | POA: Diagnosis present

## 2020-03-07 DIAGNOSIS — L0291 Cutaneous abscess, unspecified: Secondary | ICD-10-CM

## 2020-03-07 MED ORDER — CHLORHEXIDINE GLUCONATE 0.12% ORAL RINSE (MEDLINE KIT): 15.0000 mL | Freq: Two times a day (BID) | OROMUCOSAL | 0 refills | Status: AC

## 2020-03-07 MED ORDER — MUPIROCIN CALCIUM 2 % NA OINT: TOPICAL_OINTMENT | NASAL | 0 refills | Status: DC

## 2020-03-07 MED ORDER — CHLORHEXIDINE GLUCONATE 2 % EX PADS: 2.0000 | MEDICATED_PAD | Freq: Every day | CUTANEOUS | 0 refills | Status: AC

## 2020-03-07 MED ORDER — DOXYCYCLINE HYCLATE 100 MG PO TABS
100.0000 mg | ORAL_TABLET | Freq: Once | ORAL | Status: AC
  Administered 2020-03-07: 08:00:00 100 mg via ORAL
  Filled 2020-03-07: qty 1

## 2020-03-07 MED ORDER — FLUCONAZOLE 150 MG PO TABS: 150.0000 mg | ORAL_TABLET | ORAL | 0 refills | Status: DC | PRN

## 2020-03-07 MED ORDER — MUPIROCIN CALCIUM 2 % EX CREA
TOPICAL_CREAM | Freq: Once | CUTANEOUS | Status: DC
  Filled 2020-03-07: qty 15

## 2020-03-07 MED ORDER — DOXYCYCLINE HYCLATE 100 MG PO CAPS: 100.0000 mg | ORAL_CAPSULE | Freq: Two times a day (BID) | ORAL | 0 refills | Status: AC

## 2020-03-07 NOTE — ED Provider Notes (Signed)
[facility] EMERGENCY DEPARTMENT Provider Note   CSN: [id number] Arrival date & time: 03/07/20  9735     History Chief Complaint  Patient presents with  . Otalgia    Jessica [name] is a 64 y.o. female.   Otalgia Location:  Left Abscess Location:  Head/neck Head/neck abscess location:  Scalp Size:  1.0 cm Abscess quality: draining, induration and painful   Red streaking: no   Duration:  3 days Progression:  Unchanged Pain details:    Quality:  Hot and sharp   Severity:  Moderate   Timing:  Constant Chronicity:  New Relieved by:  None tried Worsened by:  Nothing Ineffective treatments:  None tried      Past Medical History:  Diagnosis Date  . Anemia   . Anxiety   . Arthritis   . Carpal tunnel syndrome   . Chronic headaches   . Depression   . Diabetes mellitus without complication ([place])   . History of vitamin D deficiency   . Hyperlipidemia   . Hypertension   . Obesity   . PERSISTENT MIGRAINE AURA W/CI W/INTRACTABLE W/SM 02/06/2007   Qualifier: Diagnosis of  By: Madilyn Fireman MD, Barnetta Chapel    . Tinnitus aurium, bilateral     Patient Active Problem List   Diagnosis Date Noted  . Left insertional Achilles tendinopathy 07/05/2019  . Encounter for medication monitoring 09/05/2018  . Low mean corpuscular volume (MCV) 09/05/2018  . History of iron deficiency anemia 09/05/2018  . History of vitamin D deficiency   . Ulnar neuropathy at elbow, right 06/05/2018  . Leg edema, left 02/23/2018  . Angular cheilitis 02/23/2018  . Hyperlipidemia associated with type 2 diabetes mellitus ([place]) 02/23/2018  . Hypertension associated with diabetes ([place]) 02/23/2018  . Diabetic 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 ([place]) 02/23/2018  . Epistaxis 09/12/2017  . Carpal tunnel syndrome 09/13/2016  . Headache, chronic daily 09/13/2016  . Acute medial meniscus tear of left knee 08/10/2016  . Primary osteoarthritis of left knee 08/10/2016  .  Tinnitus of both ears 08/10/2016  . Hydradenitis 01/18/2016  . Primary osteoarthritis of both first carpometacarpal joints 01/06/2016  . Insomnia 11/24/2015  . Anxiety 07/30/2015  . Episode of recurrent major depressive disorder ([place]) 07/30/2015  . Long-term insulin use ([place]) 07/30/2015  . Microalbuminuria due to type 2 diabetes mellitus ([place]) 07/30/2015  . Type 2 diabetes mellitus with microalbuminuria, with long-term current use of insulin ([place]) 02/06/2007    Past Surgical History:  Procedure Laterality Date  . APPENDECTOMY    . CARDIAC CATHETERIZATION     Negative. Dx was Anxiety  . HERNIA REPAIR    . LEG SURGERY    . TUBAL LIGATION       OB History   No obstetric history on file.     Family History  Problem Relation Age of Onset  . Hyperlipidemia Mother   . Hypertension Mother   . Stroke Father   . Thyroid cancer Sister   . Diabetes Maternal Aunt   . Skin cancer Maternal Uncle     Social History   Tobacco Use  . Smoking status: Never Smoker  . Smokeless tobacco: Never Used  Vaping Use  . Vaping Use: Never used  Substance Use Topics  . Alcohol use: No  . Drug use: Never    Home Medications Prior to Admission medications   Medication Sig Start Date End Date Taking? Authorizing Provider  amLODipine (NORVASC) 5 MG tablet Take 1 tablet (5 mg total)  by mouth daily. 02/08/19   Emeterio Reeve, DO  aspirin 81 MG tablet Take 81 mg by mouth at bedtime.    [provider]  Chlorhexidine Gluconate 2 % PADS Apply 2 each topically daily for 7 days. 03/07/20 03/14/20  Shantea Poulton, Corene Cornea, MD  chlorhexidine gluconate, MEDLINE KIT, (PERIDEX) 0.12 % solution Use as directed 15 mLs in the mouth or throat 2 (two) times daily for 7 days. 03/07/20 03/14/20  Donold Marotto, Corene Cornea, MD  cholecalciferol (VITAMIN D3) 25 MCG (1000 UNIT) tablet Take 1,000 Units by mouth daily.    [provider]  doxycycline (VIBRAMYCIN) 100 MG capsule Take 1 capsule (100 mg total) by mouth 2  (two) times daily for 10 days. One po bid x 7 days 03/07/20 03/17/20  Emillee Talsma, Corene Cornea, MD  ferrous sulfate 325 (65 FE) MG tablet Take 1 tablet (325 mg total) by mouth 2 (two) times daily with a meal. 02/08/19   Emeterio Reeve, DO  fluconazole (DIFLUCAN) 150 MG tablet Take 1 tablet (150 mg total) by mouth as needed. 03/07/20   Briyanna Billingham, Corene Cornea, MD  insulin degludec (TRESIBA FLEXTOUCH) 200 UNIT/ML FlexTouch Pen Inject 50-80 Units into the skin at bedtime. Increase by 2 units every 3 days until FBS consistently 90-130 then stay at that dose. 11/13/19   Samuel Bouche, NP  Insulin Pen Needle (PEN NEEDLES) 32G X 4 MM MISC Inject 1 each into the skin 2 (two) times daily. 02/24/20   Samuel Bouche, NP  Melatonin 5 MG CHEW Chew 5 mg by mouth 3 times/day as needed-between meals & bedtime.     [provider]  metFORMIN (GLUCOPHAGE) 1000 MG tablet Take 1 tablet (1,000 mg total) by mouth 2 (two) times daily with a meal. 02/24/20   Samuel Bouche, NP  mupirocin nasal ointment (BACTROBAN) 2 % Apply in each nostril daily 03/07/20   Laquisha Northcraft, Corene Cornea, MD  quinapril-hydrochlorothiazide (ACCURETIC) 20-12.5 MG tablet Take 2 tablets by mouth daily. 02/24/20   Samuel Bouche, NP  rosuvastatin (CRESTOR) 40 MG tablet Take 1 tablet (40 mg total) by mouth at bedtime. 02/24/20   Samuel Bouche, NP  Semaglutide,0.25 or 0.5MG/DOS, (OZEMPIC, 0.25 OR 0.5 MG/DOSE,) 2 MG/1.5ML SOPN Inject 0.5 mg into the skin once a week. 02/24/20   Samuel Bouche, NP  sertraline (ZOLOFT) 100 MG tablet Take 1 tablet (100 mg total) by mouth daily. 02/24/20   Samuel Bouche, NP  Specialty Vitamins Products (MAGNESIUM, AMINO ACID CHELATE,) 133 MG tablet Take by mouth.    [provider]    Allergies    Aspirin  Review of Systems   Review of Systems  HENT: Positive for ear pain.   All other systems reviewed and are negative.   Physical Exam Updated Vital Signs BP (!) 144/78 (BP Location: Left Arm)   Pulse 74   Temp 98.3 F (36.8 C) (Oral)   Resp 20    Ht 4' 11"  (1.499 m)   Wt 103 kg   SpO2 97%   BMI 45.85 kg/m   Physical Exam Vitals and nursing note reviewed.  Constitutional:      Appearance: She is well-developed and well-nourished.  HENT:     Head: Normocephalic and atraumatic.     Comments: Indurated, erythema 1.0 cm to just inside hairline on left scalp    Right Ear: Hearing normal.     Left Ear: Hearing normal. A middle ear effusion is present.     Nose: No congestion or rhinorrhea.     Mouth/Throat:     Mouth:  Mucous membranes are moist.  Eyes:     Pupils: Pupils are equal, round, and reactive to light.  Cardiovascular:     Rate and Rhythm: Normal rate and regular rhythm.  Pulmonary:     Effort: No respiratory distress.     Breath sounds: No stridor.  Abdominal:     General: Abdomen is flat. There is no distension.  Musculoskeletal:        General: No swelling or tenderness. Normal range of motion.     Cervical back: Normal range of motion.  Skin:    General: Skin is warm and dry.  Neurological:     General: No focal deficit present.     Mental Status: She is alert.     ED Results / Procedures / Treatments   Labs (all labs ordered are listed, but only abnormal results are displayed) Labs Reviewed - No data to display  EKG None  Radiology No results found.  Procedures Procedures (including critical care time)  Medications Ordered in ED Medications  mupirocin cream (BACTROBAN) 2 % ( Topical Not Given 03/07/20 0829)  doxycycline (VIBRA-TABS) tablet 100 mg (100 mg Oral Given 03/07/20 1610)    ED Course  I have reviewed the triage vital signs and the nursing notes.  Pertinent labs & imaging results that were available during my care of the patient were reviewed by me and considered in my medical decision making (see chart for details).    MDM Rules/Calculators/A&P                          Will treat for abscess.  Drained on its own and I expressed little bit of purulence.  The antibiotics for  infection will also help with the ear.  She has some evidence of trauma in her ear and possible small bulla.  She will follow-up with ENT if not improving.  Also discussed doing chlorhexidine wipes for MRSA colonization.  Final Clinical Impression(s) / ED Diagnoses Final diagnoses:  Non-recurrent acute suppurative otitis media of left ear without spontaneous rupture of tympanic membrane  Abscess    Rx / DC Orders ED Discharge Orders         Ordered    doxycycline (VIBRAMYCIN) 100 MG capsule  2 times daily        03/07/20 0825    fluconazole (DIFLUCAN) 150 MG tablet  As needed        03/07/20 0825    chlorhexidine gluconate, MEDLINE KIT, (PERIDEX) 0.12 % solution  2 times daily        03/07/20 0825    Chlorhexidine Gluconate 2 % PADS  Daily        03/07/20 0825    mupirocin nasal ointment (BACTROBAN) 2 %        03/07/20 0825           May Ozment, Corene Cornea, MD 03/07/20 1219

## 2020-03-07 NOTE — ED Triage Notes (Signed)
L ear pain and a "knot" on L side of head x 1 week

## 2020-04-08 MED FILL — QUINAPRIL-HCTZ 20-12.5 MG T: 20-12.5 | 90 days supply | Qty: 180 | Fill #0

## 2020-04-08 MED FILL — METFORMIN HCL 1000 MG TABS: 1000 | 90 days supply | Qty: 180 | Fill #0

## 2020-04-08 MED FILL — TRESIBA FLEXTOUCH 200 UNITS: 200 | 20 days supply | Qty: 9 | Fill #4

## 2020-05-18 ENCOUNTER — Ambulatory Visit (INDEPENDENT_AMBULATORY_CARE_PROVIDER_SITE_OTHER): Payer: No Typology Code available for payment source

## 2020-05-18 ENCOUNTER — Other Ambulatory Visit: Payer: Self-pay

## 2020-05-18 ENCOUNTER — Other Ambulatory Visit: Payer: Self-pay | Admitting: Osteopathic Medicine

## 2020-05-18 ENCOUNTER — Encounter: Payer: Self-pay | Admitting: Osteopathic Medicine

## 2020-05-18 ENCOUNTER — Ambulatory Visit (INDEPENDENT_AMBULATORY_CARE_PROVIDER_SITE_OTHER): Payer: No Typology Code available for payment source | Admitting: Osteopathic Medicine

## 2020-05-18 VITALS — BP 125/81 | HR 71 | Temp 98.1°F | Wt 222.0 lb

## 2020-05-18 DIAGNOSIS — M238X1 Other internal derangements of right knee: Secondary | ICD-10-CM

## 2020-05-18 DIAGNOSIS — M25561 Pain in right knee: Secondary | ICD-10-CM

## 2020-05-18 DIAGNOSIS — S83249A Other tear of medial meniscus, current injury, unspecified knee, initial encounter: Secondary | ICD-10-CM | POA: Diagnosis not present

## 2020-05-18 DIAGNOSIS — Z794 Long term (current) use of insulin: Secondary | ICD-10-CM

## 2020-05-18 DIAGNOSIS — E1129 Type 2 diabetes mellitus with other diabetic kidney complication: Secondary | ICD-10-CM

## 2020-05-18 DIAGNOSIS — R809 Proteinuria, unspecified: Secondary | ICD-10-CM

## 2020-05-18 IMAGING — DX DG KNEE COMPLETE 4+V*R*
4 series · 4 of 4 positions shown · non-contrast
Comparison: None.

CLINICAL DATA: Pain

EXAM:
RIGHT KNEE - COMPLETE 4+ VIEW

[tunnel]
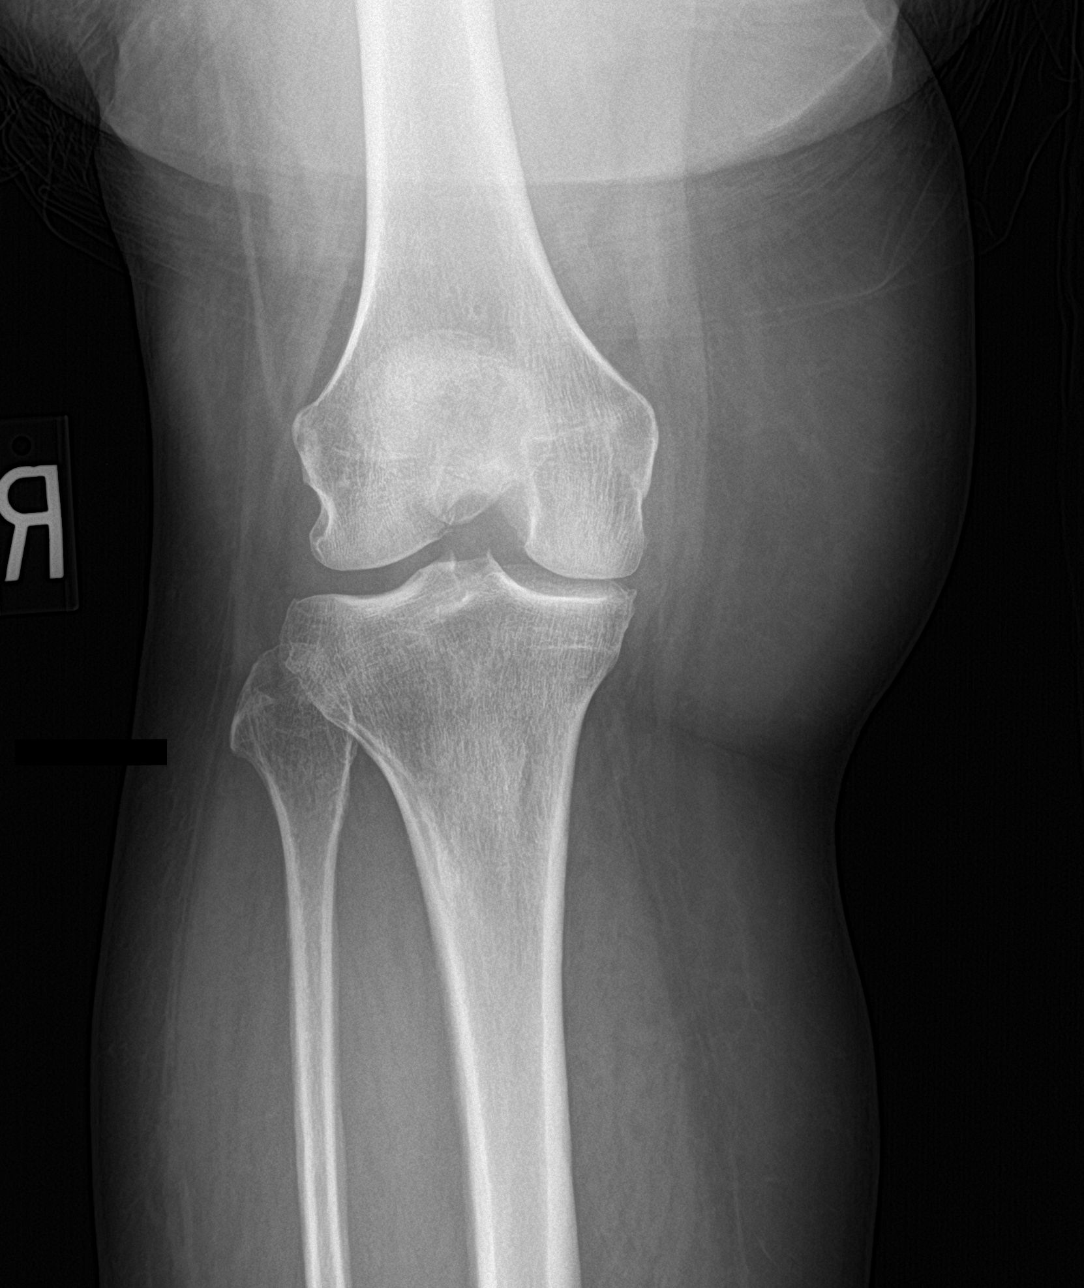

[knee lat]
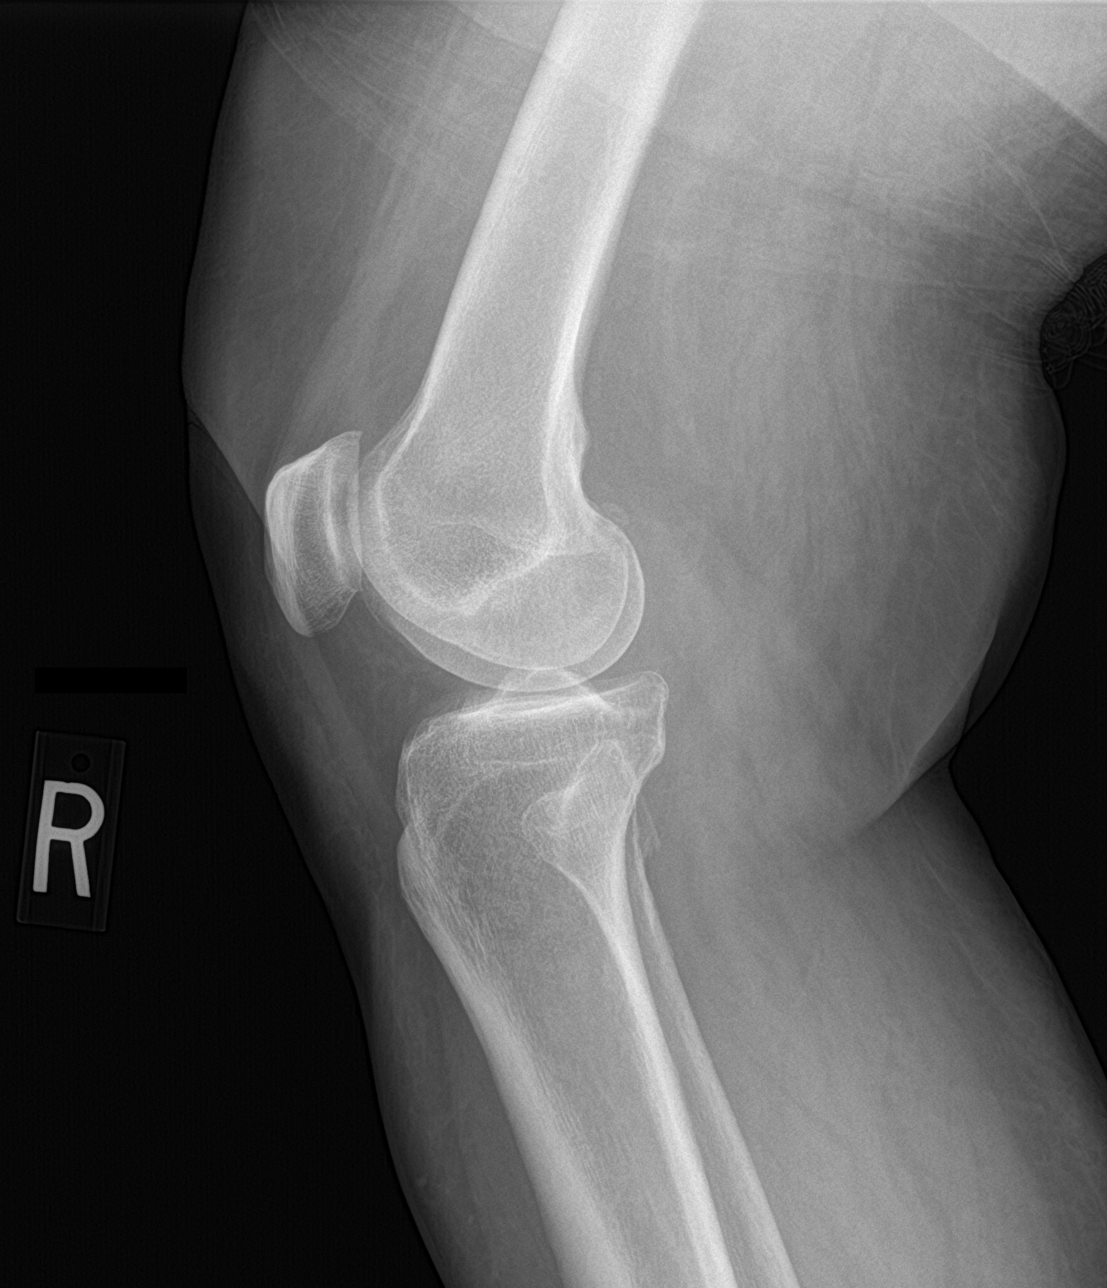

[knee sunrise]
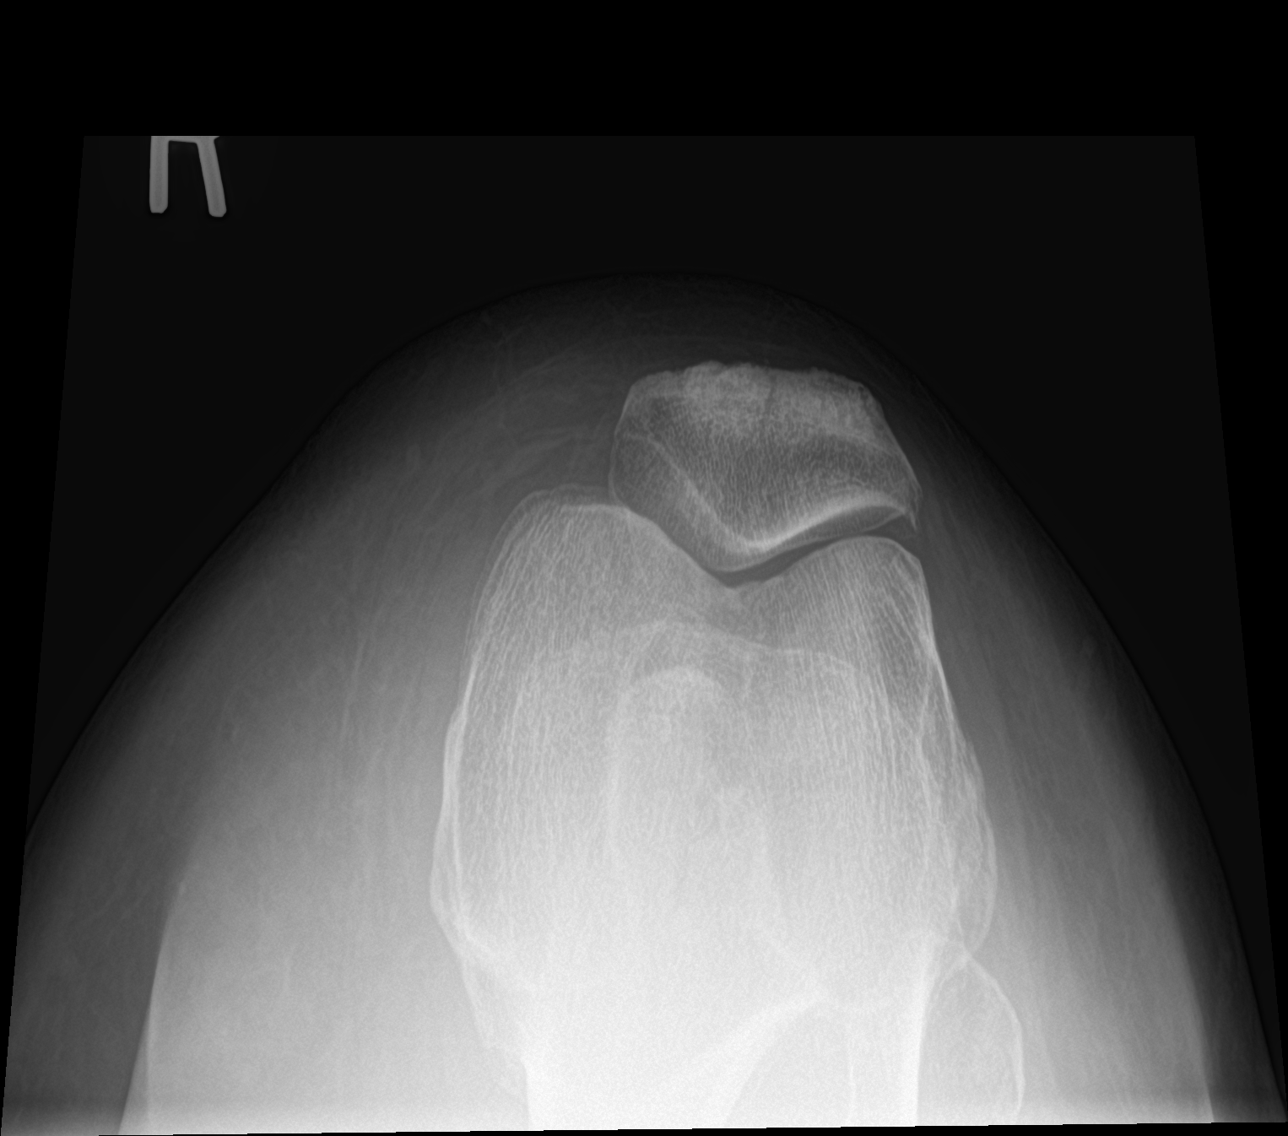

[knee ap bilat standing]
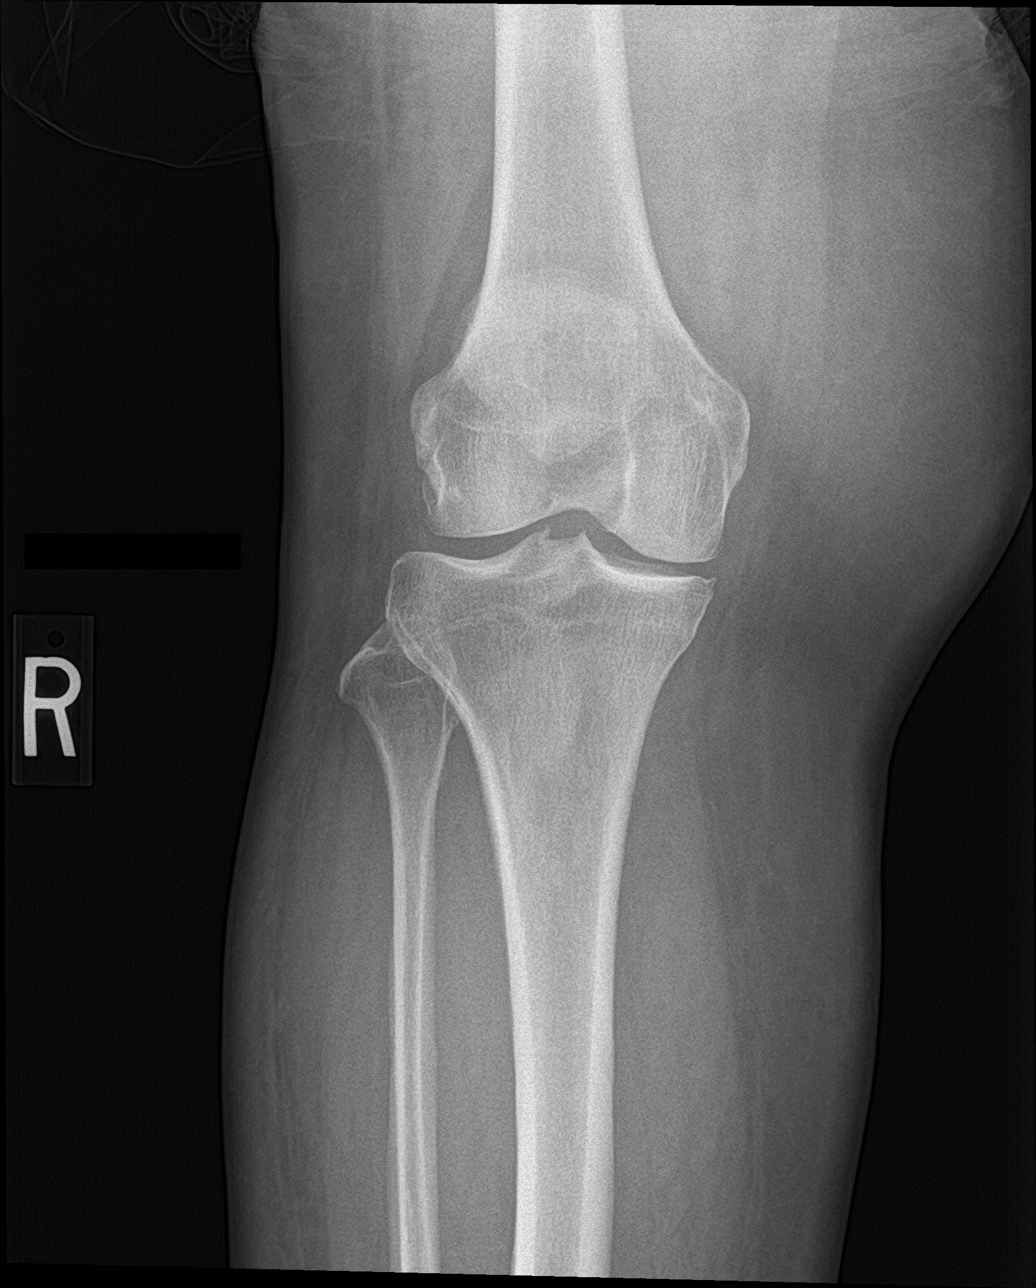

[4 of 4 positions shown; findings below may reference images not displayed]

FINDINGS: There is no evidence for an acute displaced fracture or dislocation.
Mild degenerative changes are noted of the knee. There is no
significant joint effusion.
IMPRESSION: No acute osseous abnormality.

## 2020-05-18 MED ORDER — IBUPROFEN 800 MG PO TABS: 800.0000 mg | ORAL_TABLET | Freq: Three times a day (TID) | ORAL | 2 refills | Status: DC | PRN

## 2020-05-18 MED FILL — TRESIBA FLEXTOUCH 200 UNITS: 200 | 20 days supply | Qty: 9 | Fill #5

## 2020-05-18 MED FILL — IBUPROFEN 800 MG TAB: 800 | 20 days supply | Qty: 60 | Fill #0

## 2020-05-18 MED FILL — OZEMPIC 0.25 OR 0.5 MG/DOSE: 2 | 84 days supply | Qty: 5 | Fill #2

## 2020-05-18 NOTE — Progress Notes (Signed)
Jessica [name] is a 65 y.o. female who presents to  [facility] at [employer]  today, 05/18/20, seeking care for the following:   Concern for R knee injury: 2 weeks ago was playing with her grand kids and injured knee while reaching down to pick up toy on the floor, hyperextended knee and felt a popping sensation. Was needing a cane / walker to ambulate but walking with a limp now. Ibuprofen has been helping, taking 800 mg bid. Doesn't hurt as much today, but similar issue few years ago w/ dx torn meniscus so she wants to get it checked out. On exam, some laxity on posterior drawer and (+)crepitus on R knee, no appreciable effusion, otherwise normal knee exam. Last renal fxn labs 10/2019 Cr/GFR WNL.   Past Surgical History:  Procedure Laterality Date   APPENDECTOMY     CARDIAC CATHETERIZATION     Negative. Dx was Anxiety   HERNIA REPAIR     LEG SURGERY     TUBAL LIGATION        ASSESSMENT & PLAN with other pertinent findings:  The primary encounter diagnosis was Joint laxity of right knee. A diagnosis of Type 2 diabetes mellitus with microalbuminuria, with long-term current use of insulin (HCC) was also pertinent to this visit.   Await Knee XR Refilled Ibuprofen 800 mg   Orders Placed This Encounter  Procedures   DG Knee Complete 4 Views Right   No results found.  Meds ordered this encounter  Medications   ibuprofen (ADVIL) 800 MG tablet    Sig: Take 1 tablet (800 mg total) by mouth every 8 (eight) hours as needed.    Dispense:  60 tablet    Refill:  2     See below for relevant physical exam findings  See below for recent lab and imaging results reviewed  Medications, allergies, PMH, PSH, SocH, FamH reviewed below    Follow-up instructions: Return for KEEP CURRENTLY SCHEDULED APPOINTMENT, VISIT WITH SPORTS MEDICINE FOR KNEE ISSUE IF  NEEDED.                                        Exam:  BP 125/81 (BP Location: Left Arm, Patient Position: Sitting, Cuff Size: Large)    Pulse 71    Temp 98.1 F (36.7 C) (Oral)    Wt 222 lb 0.6 oz (100.7 kg)    BMI 44.85 kg/m   Constitutional: VS see above. General Appearance: alert, well-developed, well-nourished, NAD  Neck: No masses, trachea midline.   Respiratory: Normal respiratory effort.   Musculoskeletal: see above re R knee  Neurological: Normal balance/coordination. No tremor.  Skin: warm, dry, intact.   Psychiatric: Normal judgment/insight. Normal mood and affect. Oriented x3.   Current Meds  Medication Sig   amLODipine (NORVASC) 5 MG tablet Take 1 tablet (5 mg total) by mouth daily.   aspirin 81 MG tablet Take 81 mg by mouth at bedtime.   cholecalciferol (VITAMIN D3) 25 MCG (1000 UNIT) tablet Take 1,000 Units by mouth daily.   ferrous sulfate 325 (65 FE) MG tablet Take 1 tablet (325 mg total) by mouth 2 (two) times daily with a meal.   fluconazole (DIFLUCAN) 150 MG tablet Take 1 tablet (150 mg total) by mouth as needed.   ibuprofen (ADVIL) 800 MG tablet Take 1 tablet (800 mg total) by mouth every 8 (eight)  hours as needed.   insulin degludec (TRESIBA FLEXTOUCH) 200 UNIT/ML FlexTouch Pen Inject 50-80 Units into the skin at bedtime. Increase by 2 units every 3 days until FBS consistently 90-130 then stay at that dose.   Insulin Pen Needle (PEN NEEDLES) 32G X 4 MM MISC Inject 1 each into the skin 2 (two) times daily.   Melatonin 5 MG CHEW Chew 5 mg by mouth 3 times/day as needed-between meals & bedtime.    metFORMIN (GLUCOPHAGE) 1000 MG tablet Take 1 tablet (1,000 mg total) by mouth 2 (two) times daily with a meal.   mupirocin nasal ointment (BACTROBAN) 2 % Apply in each nostril daily   quinapril-hydrochlorothiazide (ACCURETIC) 20-12.5 MG tablet Take 2 tablets by mouth daily.   rosuvastatin (CRESTOR) 40 MG tablet Take 1  tablet (40 mg total) by mouth at bedtime.   Semaglutide,0.25 or 0.5MG /DOS, (OZEMPIC, 0.25 OR 0.5 MG/DOSE,) 2 MG/1.5ML SOPN Inject 0.5 mg into the skin once a week.   sertraline (ZOLOFT) 100 MG tablet Take 1 tablet (100 mg total) by mouth daily.   Specialty Vitamins Products (MAGNESIUM, AMINO ACID CHELATE,) 133 MG tablet Take by mouth.    Allergies  Allergen Reactions   Aspirin     Bruising with long term use    Patient Active Problem List   Diagnosis Date Noted   Left insertional Achilles tendinopathy 07/05/2019   Encounter for medication monitoring 09/05/2018   Low mean corpuscular volume (MCV) 09/05/2018   History of iron deficiency anemia 09/05/2018   History of vitamin D deficiency    Ulnar neuropathy at elbow, right 06/05/2018   Leg edema, left 02/23/2018   Angular cheilitis 02/23/2018   Hyperlipidemia associated with type 2 diabetes mellitus (HCC) 02/23/2018   Hypertension associated with diabetes (HCC) 02/23/2018   Diabetic 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 (HCC) 02/23/2018   Epistaxis 09/12/2017   Carpal tunnel syndrome 09/13/2016   Headache, chronic daily 09/13/2016   Acute medial meniscus tear of left knee 08/10/2016   Primary osteoarthritis of left knee 08/10/2016   Tinnitus of both ears 08/10/2016   Hydradenitis 01/18/2016   Primary osteoarthritis of both first carpometacarpal joints 01/06/2016   Insomnia 11/24/2015   Anxiety 07/30/2015   Episode of recurrent major depressive disorder (HCC) 07/30/2015   Long-term insulin use (HCC) 07/30/2015   Microalbuminuria due to type 2 diabetes mellitus (HCC) 07/30/2015   Type 2 diabetes mellitus with microalbuminuria, with long-term current use of insulin ([facility]CC) 02/06/2007    Family History  Problem Relation Age of Onset   Hyperlipidemia Mother    Hypertension Mother    Stroke Father    Thyroid cancer Sister    Diabetes Maternal Aunt    Skin cancer Maternal Uncle      Social History   Tobacco Use  Smoking Status Never Smoker  Smokeless Tobacco Never Used    Past Surgical History:  Procedure Laterality Date   APPENDECTOMY     CARDIAC CATHETERIZATION     Negative. Dx was Anxiety   HERNIA REPAIR     LEG SURGERY     TUBAL LIGATION      Immunization History  Administered Date(s) Administered   Hepatitis B 11/27/2015, 04/07/2017, 11/06/2017   Influenza Whole 03/21/2005   Influenza,inj,Quad PF,6+ Mos 12/18/2019   Influenza-Unspecified 01/19/2019   PFIZER(Purple Top)SARS-COV-2 Vaccination 05/31/2019, 06/21/2019, 02/27/2020   Pneumococcal Polysaccharide-23 06/21/2007, 09/11/2012   Td 03/21/2005   Tdap 01/12/2010    Recent Results (from the past 2160 hour(s))  POCT HgB A1C  Status: Abnormal   Collection Time: 02/24/20  8:26 AM  Result Value Ref Range   Hemoglobin A1C 7.6 (A) 4.0 - 5.6 %   HbA1c POC (<> result, manual entry)     HbA1c, POC (prediabetic range)     HbA1c, POC (controlled diabetic range)      No results found.     All questions at time of visit were answered - patient instructed to contact office with any additional concerns or updates. ER/RTC precautions were reviewed with the patient as applicable.   Please note: manual typing as well as voice recognition software may have been used to produce this document - typos may escape review. Please contact Dr. Lyn Hollingshead for any needed clarifications.

## 2020-05-19 ENCOUNTER — Other Ambulatory Visit (HOSPITAL_COMMUNITY): Payer: Self-pay | Admitting: Pharmacist

## 2020-05-19 MED FILL — CARESTART COVID-19 HOME TES: 4 days supply | Qty: 4 | Fill #0

## 2020-05-19 NOTE — Addendum Note (Signed)
Addended by: Deirdre Pippins on: 05/19/2020 03:57 PM   Modules accepted: Orders

## 2020-05-24 ENCOUNTER — Other Ambulatory Visit: Payer: Self-pay

## 2020-05-24 ENCOUNTER — Ambulatory Visit (INDEPENDENT_AMBULATORY_CARE_PROVIDER_SITE_OTHER): Payer: No Typology Code available for payment source

## 2020-05-24 DIAGNOSIS — M238X1 Other internal derangements of right knee: Secondary | ICD-10-CM

## 2020-05-24 IMAGING — MR MR KNEE*R* W/O CM
7 series · 40 of 40 positions shown · non-contrast
Comparison: None.

CLINICAL DATA: Right knee pain on and off for 10 years

EXAM:
MRI OF THE RIGHT KNEE WITHOUT CONTRAST
TECHNIQUE: Multiplanar, multisequence MR imaging of the knee was performed. No
intravenous contrast was administered.

[Series 4: T2 fat-sat · axial · 4.0mm · 0.53mm/px · z∈[-43,+102]mm · 6 of 30 slices shown (1 of 3)]
[im 1/30]
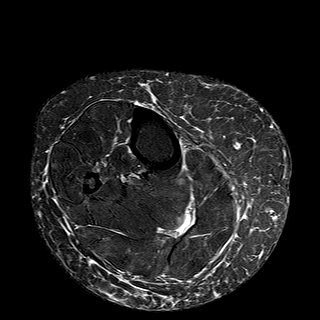
[im 6/30]
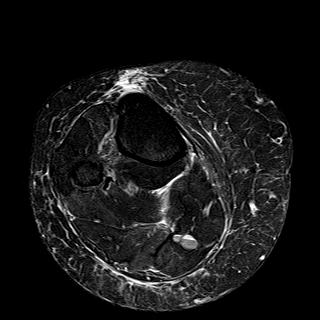
[im 12/30]
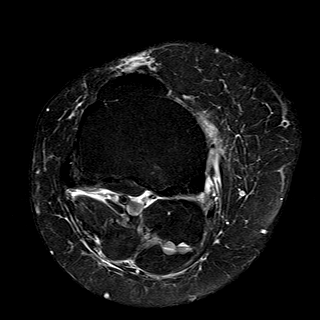
[im 18/30]
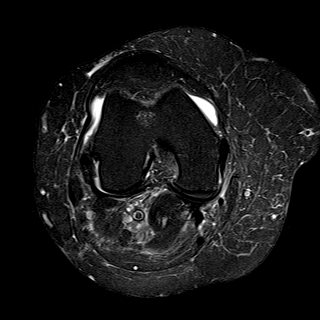
[im 24/30]
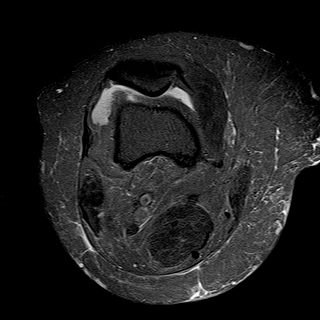
[im 30/30]
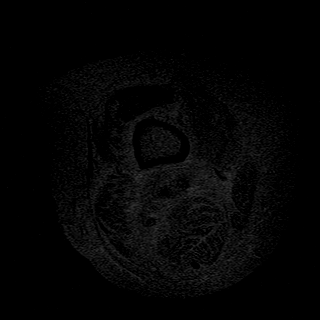

[Series 5: T1 · coronal · 4.0mm · 0.62mm/px · 6 of 28 slices shown]
[im 1/28]
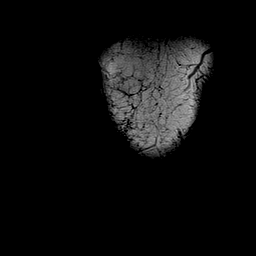
[im 6/28]
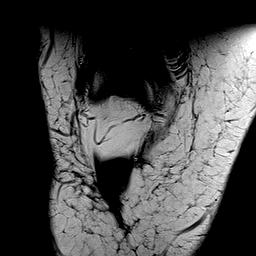
[im 11/28]
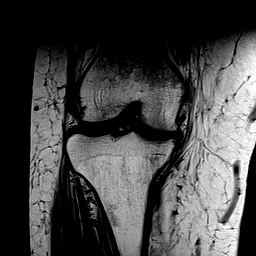
[im 17/28]
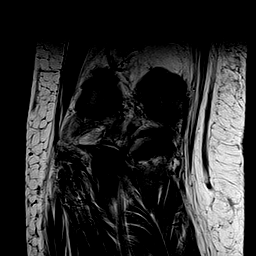
[im 22/28]
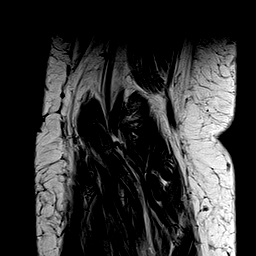
[im 28/28]
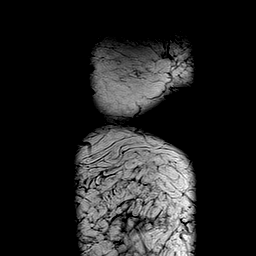

[Series 6: T2 fat-sat · coronal · 4.0mm · 0.62mm/px · 6 of 28 slices shown (2 of 3)]
[im 1/28]
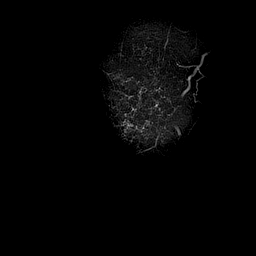
[im 6/28]
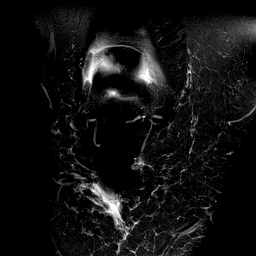
[im 11/28]
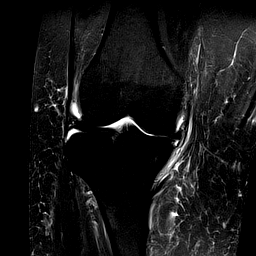
[im 17/28]
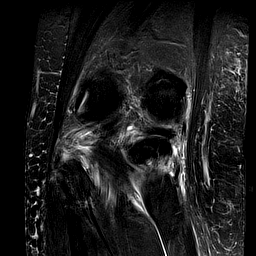
[im 22/28]
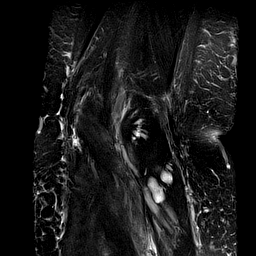
[im 28/28]
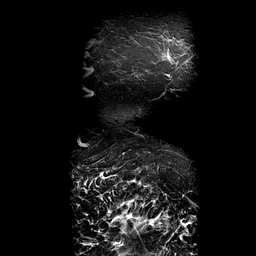

[Series 7: PD fat-sat · sagittal · 3.0mm · 0.62mm/px · 6 of 28 slices shown (1 of 3)]
[im 1/28]
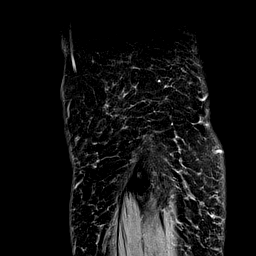
[im 6/28]
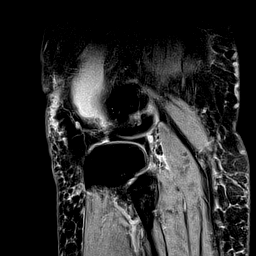
[im 11/28]
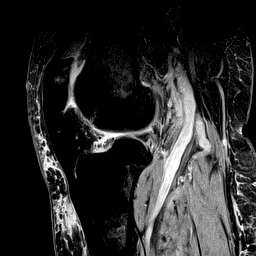
[im 17/28]
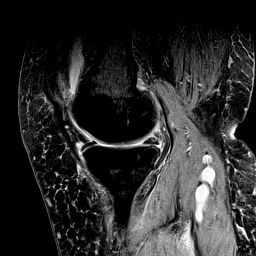
[im 22/28]
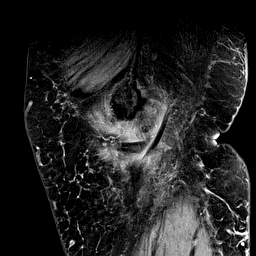
[im 28/28]
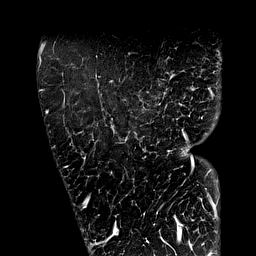

[Series 8: PD fat-sat · coronal · 4.0mm · 0.62mm/px · 6 of 28 slices shown (2 of 3)]
[im 1/28]
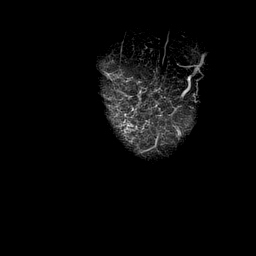
[im 6/28]
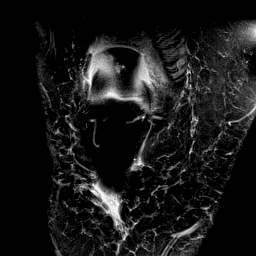
[im 11/28]
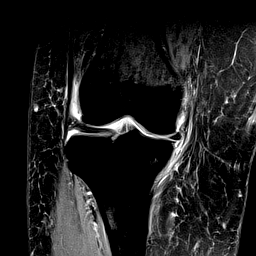
[im 17/28]
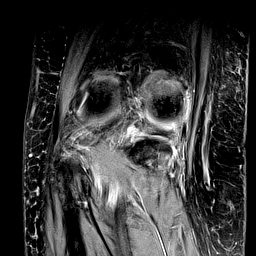
[im 22/28]
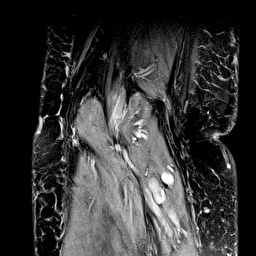
[im 28/28]
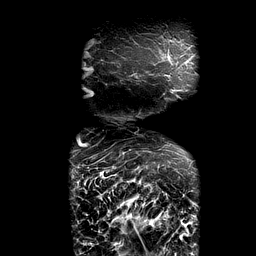

[Series 9: T2 fat-sat · sagittal · 3.0mm · 0.62mm/px · 6 of 28 slices shown (3 of 3)]
[im 1/28]
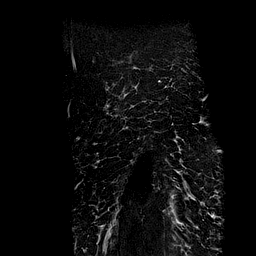
[im 6/28]
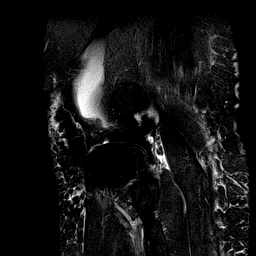
[im 11/28]
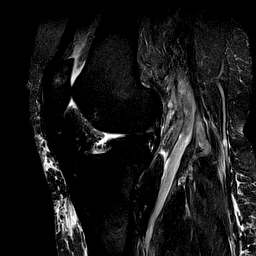
[im 17/28]
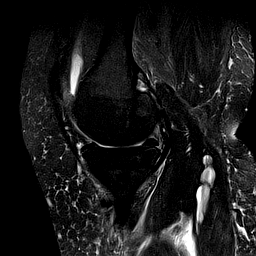
[im 22/28]
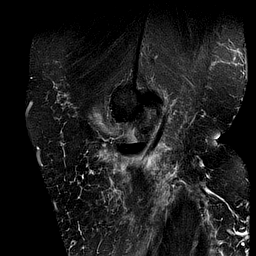
[im 28/28]
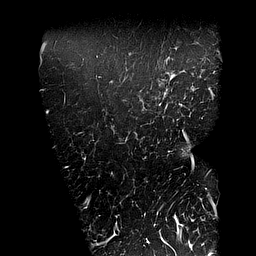

[Series 10: PD fat-sat · oblique · 2.0mm · 0.62mm/px · 4 of 19 slices shown (3 of 3)]
[im 1/19]
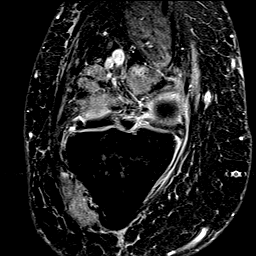
[im 7/19]
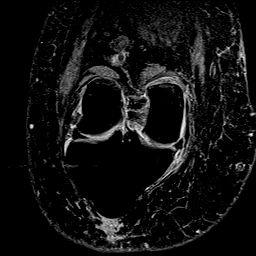
[im 13/19]
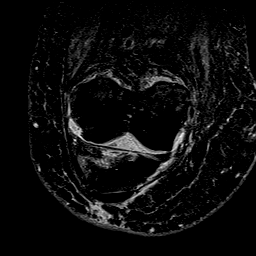
[im 19/19]
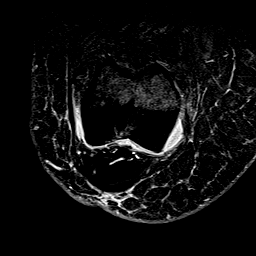

[40 of 40 positions shown; findings below may reference images not displayed]

FINDINGS: MENISCI

Medial: Radial tear of the posterior horn of the medial meniscus
with peripheral meniscal extrusion.

Lateral: Intact.

LIGAMENTS

Cruciates: ACL and PCL are intact.

Collaterals: Medial collateral ligament is intact. Lateral
collateral ligament complex is intact.

CARTILAGE

Patellofemoral: High-grade partial-thickness cartilage loss of the
lateral patellofemoral compartment with areas of full-thickness
cartilage loss and subchondral reactive marrow changes.

Medial: Partial-thickness cartilage loss of the medial femorotibial
compartment with areas of high-grade partial-thickness cartilage
loss.

Lateral: Partial thickness cartilage loss of the lateral
femorotibial compartment.

JOINT: Small joint effusion. Normal JOSHJAX. No plical
thickening.

POPLITEAL FOSSA: Popliteus tendon is intact. No Baker's cyst.

EXTENSOR MECHANISM: Intact quadriceps tendon. Intact patellar
tendon. Intact lateral patellar retinaculum. Intact medial patellar
retinaculum. Intact MPFL.

BONES: No aggressive osseous lesion. No fracture or dislocation.
Nonspecific perifascial fluid between the soleus muscle and
gastrocnemius muscles.

Other: No fluid collection or hematoma. Muscles are normal.
IMPRESSION: 1. Radial tear of the posterior horn of the medial meniscus with
peripheral meniscal extrusion.
2. Tricompartmental cartilage abnormalities as described above.

## 2020-05-25 ENCOUNTER — Ambulatory Visit: Payer: No Typology Code available for payment source | Admitting: Osteopathic Medicine

## 2020-05-26 ENCOUNTER — Encounter: Payer: Self-pay | Admitting: Osteopathic Medicine

## 2020-05-26 NOTE — Addendum Note (Signed)
Addended by: Deirdre Pippins on: 05/26/2020 11:59 AM   Modules accepted: Orders

## 2020-06-03 ENCOUNTER — Other Ambulatory Visit: Payer: Self-pay

## 2020-06-03 ENCOUNTER — Encounter: Payer: Self-pay | Admitting: Physician Assistant

## 2020-06-03 ENCOUNTER — Ambulatory Visit (INDEPENDENT_AMBULATORY_CARE_PROVIDER_SITE_OTHER): Payer: No Typology Code available for payment source | Admitting: Physician Assistant

## 2020-06-03 VITALS — Ht 59.5 in | Wt 225.6 lb

## 2020-06-03 DIAGNOSIS — M1711 Unilateral primary osteoarthritis, right knee: Secondary | ICD-10-CM | POA: Diagnosis not present

## 2020-06-03 DIAGNOSIS — M25561 Pain in right knee: Secondary | ICD-10-CM | POA: Diagnosis not present

## 2020-06-03 MED ORDER — METHYLPREDNISOLONE ACETATE 40 MG/ML IJ SUSP
40.0000 mg | INTRAMUSCULAR | Status: AC | PRN
  Administered 2020-06-03: 40 mg via INTRA_ARTICULAR

## 2020-06-03 MED ORDER — LIDOCAINE HCL 1 % IJ SOLN
3.0000 mL | INTRAMUSCULAR | Status: AC | PRN
  Administered 2020-06-03: 3 mL

## 2020-06-03 NOTE — Progress Notes (Addendum)
Office Visit Note   Patient: Jessica [name]           Date of Birth: 09-14-1955           MRN: [id number] Visit Date: 06/03/2020              Requested by: Sunnie Nielsen, DO [address] [place],  [place] [address] PCP: Sunnie Nielsen, DO   Assessment & Plan: Visit Diagnoses:  1. Primary osteoarthritis of right knee   2. Acute pain of right knee     Plan: Explained to the patient that she has tricompartmental arthritis and I feel that this is in fact the cause of her pain in the giving way sensation and not so much the radial meniscal tear.  We will see her back in 2 weeks to see how she has responded to the aspiration injection of the right knee.  She will apply Voltaren gel 4 g up to 4 times daily to the knee.  She can take Tylenol.  She will avoid oral NSAIDs as much as possible.  However if she is going to take oral NSAID I had like for her to take ibuprofen or Aleve but not both that she has been doing.  She is given a knee brace today.  This should help support her knee which is giving way.  Questions were encouraged and answered at length today with radiographs and MRI was reviewed with the patient today.  Follow-Up Instructions: Return in about 2 weeks (around 06/17/2020).   Orders:  Orders Placed This Encounter  Procedures  . Large Joint Inj   No orders of the defined types were placed in this encounter.     Procedures: Large Joint Inj: R knee on 06/03/2020 11:56 AM Indications: pain Details: 22 G 1.5 in needle, superolateral approach  Arthrogram: No  Medications: 3 mL lidocaine 1 %; 40 mg methylPREDNISolone acetate 40 MG/ML Aspirate: 15 mL yellow and blood-tinged Outcome: tolerated well, no immediate complications Procedure, treatment alternatives, risks and benefits explained, specific risks discussed. Consent was given by the patient. Immediately prior to procedure a time out was called to verify the correct patient, procedure,  equipment, support staff and site/side marked as required. Patient was prepped and draped in the usual sterile fashion.       Clinical Data: No additional findings.   Subjective: Chief Complaint  Patient presents with  . Right Knee - Pain    HPI Jessica [name] is 65 year old female were seen for the first time for right knee pain for a month.  She states that a month ago she was bending down and heard a pop in her knee.  Since that time her knees became more painful.  She has been taking ibuprofen and Aleve for the pain.  Doing a home exercise program.  She notes she has had some pain in her knee periodically.  But this is became more persistent.  The knee is giving way.  She has had no other treatment for the knee.  No fevers chills.  She is diabetic reports good control. Radiographs dated 05/18/2020 4 views of the right knee are reviewed and shown to the patient.  These show no acute fractures.  No significant joint arthritis.  Only mild narrowing medial joint line and mild patellofemoral changes.  Knee is well located. MRI right knee dated 05/19/2020 is reviewed with the patient today.  MRI shows a radial tear involving the posterior horn medial meniscus with peripheral meniscal  extrusion.  However there is tricompartmental changes with partial to high-grade partial-thickness cartilage loss involving the medial compartment and patellofemoral compartment.  Lateral compartment with partial thickness cartilage loss. Review of Systems See HPI  Objective: Vital Signs: Ht 4' 11.5" (1.511 m)   Wt 225 lb 9.6 oz (102.3 kg)   BMI 44.80 kg/m   Physical Exam Constitutional:      Appearance: She is not ill-appearing or diaphoretic.  Pulmonary:     Effort: Pulmonary effort is normal.  Neurological:     Mental Status: She is alert and oriented to person, place, and time.  Psychiatric:        Mood and Affect: Mood normal.     Ortho Exam Ambulates without any assistive device with a antalgic  gait on the right.  Right knee full extension.  Global tenderness.  Positive effusion no abnormal warmth erythema.  No instability valgus varus stressing.  Unable to tolerate McMurray's maneuver.  Left knee normal exam with full range of motion which is painless. Specialty Comments:  No specialty comments available.  Imaging: No results found.   PMFS History: Patient Active Problem List   Diagnosis Date Noted  . Left insertional Achilles tendinopathy 07/05/2019  . Encounter for medication monitoring 09/05/2018  . Low mean corpuscular volume (MCV) 09/05/2018  . History of iron deficiency anemia 09/05/2018  . History of vitamin D deficiency   . Ulnar neuropathy at elbow, right 06/05/2018  . Leg edema, left 02/23/2018  . Angular cheilitis 02/23/2018  . Hyperlipidemia associated with type 2 diabetes mellitus (HCC) 02/23/2018  . Hypertension associated with diabetes (HCC) 02/23/2018  . Diabetic 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 (HCC) 02/23/2018  . Epistaxis 09/12/2017  . Carpal tunnel syndrome 09/13/2016  . Headache, chronic daily 09/13/2016  . Acute medial meniscus tear of left knee 08/10/2016  . Primary osteoarthritis of left knee 08/10/2016  . Tinnitus of both ears 08/10/2016  . Hydradenitis 01/18/2016  . Primary osteoarthritis of both first carpometacarpal joints 01/06/2016  . Insomnia 11/24/2015  . Anxiety 07/30/2015  . Episode of recurrent major depressive disorder (HCC) 07/30/2015  . Long-term insulin use (HCC) 07/30/2015  . Microalbuminuria due to type 2 diabetes mellitus (HCC) 07/30/2015  . Type 2 diabetes mellitus with microalbuminuria, with long-term current use of insulin (HCC) 02/06/2007   Past Medical History:  Diagnosis Date  . Anemia   . Anxiety   . Arthritis   . Carpal tunnel syndrome   . Chronic headaches   . Depression   . Diabetes mellitus without complication (HCC)   . History of vitamin D deficiency   . Hyperlipidemia   .  Hypertension   . Obesity   . 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 Tinnitus aurium, bilateral     Family History  Problem Relation Age of Onset  . Hyperlipidemia Mother   . Hypertension Mother   . Stroke Father   . Thyroid cancer Sister   . Diabetes Maternal Aunt   . Skin cancer Maternal Uncle     Past Surgical History:  Procedure Laterality Date  . APPENDECTOMY    . CARDIAC CATHETERIZATION     Negative. Dx was Anxiety  . HERNIA REPAIR    . LEG SURGERY    . TUBAL LIGATION     Social History   Occupational History  . Not on file  Tobacco Use  . Smoking status: Never Smoker  .  Smokeless tobacco: Never Used  Vaping Use  . Vaping Use: Never used  Substance and Sexual Activity  . Alcohol use: No  . Drug use: Never  . Sexual activity: Not Currently    Birth control/protection: None, Post-menopausal, Abstinence

## 2020-06-15 ENCOUNTER — Ambulatory Visit: Payer: No Typology Code available for payment source | Admitting: Physician Assistant

## 2020-06-20 ENCOUNTER — Other Ambulatory Visit (HOSPITAL_BASED_OUTPATIENT_CLINIC_OR_DEPARTMENT_OTHER): Payer: Self-pay

## 2020-07-02 ENCOUNTER — Other Ambulatory Visit (HOSPITAL_BASED_OUTPATIENT_CLINIC_OR_DEPARTMENT_OTHER): Payer: Self-pay

## 2020-07-02 MED FILL — Insulin Degludec Soln Pen-Injector 200 Unit/ML: SUBCUTANEOUS | 30 days supply | Qty: 15 | Fill #0 | Status: AC

## 2020-07-13 ENCOUNTER — Telehealth: Payer: Self-pay

## 2020-07-13 ENCOUNTER — Ambulatory Visit (INDEPENDENT_AMBULATORY_CARE_PROVIDER_SITE_OTHER): Payer: No Typology Code available for payment source | Admitting: Physician Assistant

## 2020-07-13 ENCOUNTER — Encounter: Payer: Self-pay | Admitting: Physician Assistant

## 2020-07-13 DIAGNOSIS — M1711 Unilateral primary osteoarthritis, right knee: Secondary | ICD-10-CM

## 2020-07-13 NOTE — Telephone Encounter (Signed)
Please get auth for right knee gel injection-gil pt 

## 2020-07-13 NOTE — Progress Notes (Signed)
HPI: Jessica [name] returns today for follow-up of her right knee pain.  Last visit we aspirated the knee.  She has had cortisone injections in the past and needs to raise her glucose levels and also left out wart.  Again MRI of her knee showed tricompartmental arthritic changes that were high-grade partial-thickness cartilage loss involving the medial compartment and patella femoral compartment.  Lateral compartment with partial thickness cartilage loss.  Radial tear involving the posterior horn of the medial meniscus with peripheral meniscal extrusion is seen. She states that Voltaren gel has helped some.  She is taking Tylenol mainly for pain.  Notes at times knee feels great but then at times she has pain particularly medial aspect of the knee.  She does use a cane at times due to knee pain.  Physical exam: Right knee full range of motion.  Patellofemoral crepitus with passive range of motion of the knee.  No instability valgus varus stressing.  Tenderness along medial joint line right knee.  No abnormal warmth erythema or effusion.  Impression: Right knee tricompartmental arthritic changes  Plan: Due to the fact the patient is failed conservative treatment with cortisone injections, exercise, bracing and the use of a cane recommend supplemental injection.  We will have her follow-up with [place] once supplemental injections been approved.  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  Questions encouraged and answered.

## 2020-07-13 NOTE — Telephone Encounter (Signed)
Noted  

## 2020-07-15 ENCOUNTER — Other Ambulatory Visit (HOSPITAL_BASED_OUTPATIENT_CLINIC_OR_DEPARTMENT_OTHER): Payer: Self-pay

## 2020-07-15 MED FILL — Rosuvastatin Calcium Tab 40 MG: ORAL | 90 days supply | Qty: 90 | Fill #0 | Status: AC

## 2020-07-15 MED FILL — Metformin HCl Tab 1000 MG: ORAL | 90 days supply | Qty: 180 | Fill #0 | Status: AC

## 2020-07-15 MED FILL — Quinapril-Hydrochlorothiazide Tab 20-12.5 MG: ORAL | 90 days supply | Qty: 180 | Fill #0 | Status: AC

## 2020-07-16 ENCOUNTER — Other Ambulatory Visit (HOSPITAL_BASED_OUTPATIENT_CLINIC_OR_DEPARTMENT_OTHER): Payer: Self-pay

## 2020-07-16 MED FILL — Sertraline HCl Tab 100 MG: ORAL | 90 days supply | Qty: 90 | Fill #0 | Status: AC

## 2020-08-12 ENCOUNTER — Other Ambulatory Visit (HOSPITAL_COMMUNITY): Payer: Self-pay

## 2020-08-26 ENCOUNTER — Telehealth: Payer: Self-pay

## 2020-08-26 NOTE — Telephone Encounter (Signed)
Talked with Noel Gerold and initiated PA for Monovisc, right knee. Per Odelia Gage., office notes need to be faxed to [phone]. Call reference# [id number]  Faxed office notes to [phone].

## 2020-09-03 LAB — HM DIABETES EYE EXAM

## 2020-09-04 ENCOUNTER — Other Ambulatory Visit: Payer: Self-pay | Admitting: Medical-Surgical

## 2020-09-04 DIAGNOSIS — Z0000 Encounter for general adult medical examination without abnormal findings: Secondary | ICD-10-CM

## 2020-09-04 DIAGNOSIS — Z862 Personal history of diseases of the blood and blood-forming organs and certain disorders involving the immune mechanism: Secondary | ICD-10-CM

## 2020-09-04 DIAGNOSIS — E559 Vitamin D deficiency, unspecified: Secondary | ICD-10-CM

## 2020-09-04 DIAGNOSIS — Z1329 Encounter for screening for other suspected endocrine disorder: Secondary | ICD-10-CM

## 2020-09-04 DIAGNOSIS — E1159 Type 2 diabetes mellitus with other circulatory complications: Secondary | ICD-10-CM

## 2020-09-04 DIAGNOSIS — E1129 Type 2 diabetes mellitus with other diabetic kidney complication: Secondary | ICD-10-CM

## 2020-09-04 DIAGNOSIS — E785 Hyperlipidemia, unspecified: Secondary | ICD-10-CM

## 2020-09-11 LAB — CBC WITH DIFFERENTIAL/PLATELET
Absolute Monocytes: 563 cells/uL (ref 200–950)
Basophils Absolute: 51 cells/uL (ref 0–200)
Basophils Relative: 0.4 %
Eosinophils Absolute: 730 cells/uL — ABNORMAL HIGH (ref 15–500)
Eosinophils Relative: 5.7 %
HCT: 36.3 % (ref 35.0–45.0)
Hemoglobin: 11.9 g/dL (ref 11.7–15.5)
Lymphs Abs: 1677 cells/uL (ref 850–3900)
MCH: 25.3 pg — ABNORMAL LOW (ref 27.0–33.0)
MCHC: 32.8 g/dL (ref 32.0–36.0)
MCV: 77.1 fL — ABNORMAL LOW (ref 80.0–100.0)
MPV: 9 fL (ref 7.5–12.5)
Monocytes Relative: 4.4 %
Neutro Abs: 9779 cells/uL — ABNORMAL HIGH (ref 1500–7800)
Neutrophils Relative %: 76.4 %
Platelets: 373 10*3/uL (ref 140–400)
RBC: 4.71 10*6/uL (ref 3.80–5.10)
RDW: 14.4 % (ref 11.0–15.0)
Total Lymphocyte: 13.1 %
WBC: 12.8 10*3/uL — ABNORMAL HIGH (ref 3.8–10.8)

## 2020-09-11 LAB — COMPLETE METABOLIC PANEL WITH GFR
AG Ratio: 1.4 (calc) (ref 1.0–2.5)
ALT: 17 U/L (ref 6–29)
AST: 20 U/L (ref 10–35)
Albumin: 4.1 g/dL (ref 3.6–5.1)
Alkaline phosphatase (APISO): 63 U/L (ref 37–153)
BUN: 16 mg/dL (ref 7–25)
CO2: 31 mmol/L (ref 20–32)
Calcium: 9.6 mg/dL (ref 8.6–10.4)
Chloride: 93 mmol/L — ABNORMAL LOW (ref 98–110)
Creat: 0.67 mg/dL (ref 0.50–0.99)
GFR, Est African American: 109 mL/min/{1.73_m2} (ref >=60)
GFR, Est Non African American: 94 mL/min/{1.73_m2} (ref >=60)
Globulin: 2.9 g/dL (calc) (ref 1.9–3.7)
Glucose, Bld: 89 mg/dL (ref 65–99)
Potassium: 3.8 mmol/L (ref 3.5–5.3)
Sodium: 135 mmol/L (ref 135–146)
Total Bilirubin: 0.5 mg/dL (ref 0.2–1.2)
Total Protein: 7 g/dL (ref 6.1–8.1)

## 2020-09-11 LAB — VITAMIN D 25 HYDROXY (VIT D DEFICIENCY, FRACTURES): Vit D, 25-Hydroxy: 48 ng/mL (ref 30–100)

## 2020-09-11 LAB — LIPID PANEL
Cholesterol: 122 mg/dL (ref <200)
HDL: 39 mg/dL — ABNORMAL LOW (ref >=50)
LDL Cholesterol (Calc): 59 mg/dL (calc)
Non-HDL Cholesterol (Calc): 83 mg/dL (calc) (ref <130)
Total CHOL/HDL Ratio: 3.1 (calc) (ref <5.0)
Triglycerides: 159 mg/dL — ABNORMAL HIGH (ref <150)

## 2020-09-11 LAB — IRON,TIBC AND FERRITIN PANEL
%SAT: 10 % (calc) — ABNORMAL LOW (ref 16–45)
Ferritin: 23 ng/mL (ref 16–288)
Iron: 38 ug/dL — ABNORMAL LOW (ref 45–160)
TIBC: 396 mcg/dL (calc) (ref 250–450)

## 2020-09-11 LAB — TSH: TSH: 2.37 mIU/L (ref 0.40–4.50)

## 2020-09-14 ENCOUNTER — Telehealth: Payer: Self-pay

## 2020-09-14 ENCOUNTER — Encounter: Payer: Self-pay | Admitting: Medical-Surgical

## 2020-09-14 ENCOUNTER — Other Ambulatory Visit (HOSPITAL_BASED_OUTPATIENT_CLINIC_OR_DEPARTMENT_OTHER): Payer: Self-pay

## 2020-09-14 ENCOUNTER — Ambulatory Visit (INDEPENDENT_AMBULATORY_CARE_PROVIDER_SITE_OTHER): Payer: No Typology Code available for payment source | Admitting: Medical-Surgical

## 2020-09-14 ENCOUNTER — Other Ambulatory Visit: Payer: Self-pay

## 2020-09-14 VITALS — BP 116/66 | HR 65 | Temp 98.5°F | Ht 58.25 in | Wt 210.2 lb

## 2020-09-14 DIAGNOSIS — Z794 Long term (current) use of insulin: Secondary | ICD-10-CM

## 2020-09-14 DIAGNOSIS — F330 Major depressive disorder, recurrent, mild: Secondary | ICD-10-CM

## 2020-09-14 DIAGNOSIS — Z23 Encounter for immunization: Secondary | ICD-10-CM | POA: Diagnosis not present

## 2020-09-14 DIAGNOSIS — R809 Proteinuria, unspecified: Secondary | ICD-10-CM | POA: Diagnosis not present

## 2020-09-14 DIAGNOSIS — Z0000 Encounter for general adult medical examination without abnormal findings: Secondary | ICD-10-CM | POA: Diagnosis not present

## 2020-09-14 DIAGNOSIS — Z1211 Encounter for screening for malignant neoplasm of colon: Secondary | ICD-10-CM

## 2020-09-14 DIAGNOSIS — E1159 Type 2 diabetes mellitus with other circulatory complications: Secondary | ICD-10-CM

## 2020-09-14 DIAGNOSIS — Z1231 Encounter for screening mammogram for malignant neoplasm of breast: Secondary | ICD-10-CM | POA: Diagnosis not present

## 2020-09-14 DIAGNOSIS — E1129 Type 2 diabetes mellitus with other diabetic kidney complication: Secondary | ICD-10-CM | POA: Diagnosis not present

## 2020-09-14 DIAGNOSIS — E1141 Type 2 diabetes mellitus with diabetic mononeuropathy: Secondary | ICD-10-CM

## 2020-09-14 DIAGNOSIS — I152 Hypertension secondary to endocrine disorders: Secondary | ICD-10-CM

## 2020-09-14 LAB — POCT GLYCOSYLATED HEMOGLOBIN (HGB A1C): Hemoglobin A1C: 6.4 % — AB (ref 4.0–5.6)

## 2020-09-14 MED ORDER — OZEMPIC (0.25 OR 0.5 MG/DOSE) 2 MG/1.5ML ~~LOC~~ SOPN
0.5000 mg | PEN_INJECTOR | SUBCUTANEOUS | 4 refills | Status: DC
  Filled 2020-09-14: qty 4.5, 84d supply, fill #0
  Filled 2021-01-05: qty 4.5, 84d supply, fill #1
  Filled 2021-07-01: qty 4.5, 84d supply, fill #2

## 2020-09-14 MED ORDER — SERTRALINE HCL 100 MG PO TABS
100.0000 mg | ORAL_TABLET | Freq: Every day | ORAL | 1 refills | Status: DC
  Filled 2020-09-14: qty 90, fill #0
  Filled 2020-12-16: qty 90, 90d supply, fill #0
  Filled 2021-05-10: qty 90, 90d supply, fill #1

## 2020-09-14 MED ORDER — AMLODIPINE BESYLATE 5 MG PO TABS
5.0000 mg | ORAL_TABLET | Freq: Every day | ORAL | 1 refills | Status: DC
  Filled 2020-09-14: qty 90, 90d supply, fill #0
  Filled 2020-12-15: qty 90, 90d supply, fill #1

## 2020-09-14 MED ORDER — INSULIN DEGLUDEC 200 UNIT/ML ~~LOC~~ SOPN
PEN_INJECTOR | SUBCUTANEOUS | 11 refills | Status: DC
  Filled 2020-09-14: qty 15, 30d supply, fill #0
  Filled 2020-12-04: qty 15, 30d supply, fill #1
  Filled 2021-02-18: qty 15, 30d supply, fill #2
  Filled 2021-05-10: qty 15, 30d supply, fill #3
  Filled 2021-05-14: qty 6, 15d supply, fill #3
  Filled 2021-05-19: qty 15, 30d supply, fill #3

## 2020-09-14 NOTE — Patient Instructions (Addendum)
Preventive Care 63-64 Years Old, Female Preventive care refers to lifestyle choices and visits with your health care provider that can promote health and wellness. This includes: A yearly physical exam. This is also called an annual wellness visit. Regular dental and eye exams. Immunizations. Screening for certain conditions. Healthy lifestyle choices, such as: Eating a healthy diet. Getting regular exercise. Not using drugs or products that contain nicotine and tobacco. Limiting alcohol use. What can I expect for my preventive care visit? Physical exam Your health care provider will check your: Height and weight. These may be used to calculate your BMI (body mass index). BMI is a measurement that tells if you are at a healthy weight. Heart rate and blood pressure. Body temperature. Skin for abnormal spots. Counseling Your health care provider may ask you questions about your: Past medical problems. Family's medical history. Alcohol, tobacco, and drug use. Emotional well-being. Home life and relationship well-being. Sexual activity. Diet, exercise, and sleep habits. Work and work environment. Access to firearms. Method of birth control. Menstrual cycle. Pregnancy history. What immunizations do I need?  Vaccines are usually given at various ages, according to a schedule. Your health care provider will recommend vaccines for you based on your age, medicalhistory, and lifestyle or other factors, such as travel or where you work. What tests do I need? Blood tests Lipid and cholesterol levels. These may be checked every 5 years, or more often if you are over 63 years old. Hepatitis C test. Hepatitis B test. Screening Lung cancer screening. You may have this screening every year starting at age 63 if you have a 30-pack-year history of smoking and currently smoke or have quit within the past 15 years. Colorectal cancer screening. All adults should have this screening starting at  age 63 and continuing until age 75. Your health care provider may recommend screening at age 63 if you are at increased risk. You will have tests every 1-10 years, depending on your results and the type of screening test. Diabetes screening. This is done by checking your blood sugar (glucose) after you have not eaten for a while (fasting). You may have this done every 1-3 years. Mammogram. This may be done every 1-2 years. Talk with your health care provider about when you should start having regular mammograms. This may depend on whether you have a family history of breast cancer. BRCA-related cancer screening. This may be done if you have a family history of breast, ovarian, tubal, or peritoneal cancers. Pelvic exam and Pap test. This may be done every 3 years starting at age 63. Starting at age 63, this may be done every 5 years if you have a Pap test in combination with an HPV test. Other tests STD (sexually transmitted disease) testing, if you are at risk. Bone density scan. This is done to screen for osteoporosis. You may have this scan if you are at high risk for osteoporosis. Talk with your health care provider about your test results, treatment options,and if necessary, the need for more tests. Follow these instructions at home: Eating and drinking  Eat a diet that includes fresh fruits and vegetables, whole grains, lean protein, and low-fat dairy products. Take vitamin and mineral supplements as recommended by your health care provider. Do not drink alcohol if: Your health care provider tells you not to drink. You are pregnant, may be pregnant, or are planning to become pregnant. If you drink alcohol: Limit how much you have to 0-1 drink a day. Be aware   of how much alcohol is in your drink. In the [place] one drink equals one 12 oz bottle of beer (355 mL), one 5 oz glass of wine (148 mL), or one 1 oz glass of hard liquor (44 mL).  Lifestyle Take daily care of your teeth and  gums. Brush your teeth every morning and night with fluoride toothpaste. Floss one time each day. Stay active. Exercise for at least 30 minutes 5 or more days each week. Do not use any products that contain nicotine or tobacco, such as cigarettes, e-cigarettes, and chewing tobacco. If you need help quitting, ask your health care provider. Do not use drugs. If you are sexually active, practice safe sex. Use a condom or other form of protection to prevent STIs (sexually transmitted infections). If you do not wish to become pregnant, use a form of birth control. If you plan to become pregnant, see your health care provider for a prepregnancy visit. If told by your health care provider, take low-dose aspirin daily starting at age 63. Find healthy ways to cope with stress, such as: Meditation, yoga, or listening to music. Journaling. Talking to a trusted person. Spending time with friends and family. Safety Always wear your seat belt while driving or riding in a vehicle. Do not drive: If you have been drinking alcohol. Do not ride with someone who has been drinking. When you are tired or distracted. While texting. Wear a helmet and other protective equipment during sports activities. If you have firearms in your house, make sure you follow all gun safety procedures. What's next? Visit your health care provider once a year for an annual wellness visit. Ask your health care provider how often you should have your eyes and teeth checked. Stay up to date on all vaccines. This information is not intended to replace advice given to you by your health care provider. Make sure you discuss any questions you have with your healthcare provider. Document Revised: 12/10/2019 Document Reviewed: 11/16/2017 Elsevier Patient Education  2022 Elsevier Inc.   Recombinant Zoster (Shingles) Vaccine: What You Need to Know 1. Why get vaccinated? Recombinant zoster (shingles) vaccine can prevent  shingles. Shingles (also called herpes zoster, or just zoster) is a painful skin rash, usually with blisters. In addition to the rash, shingles can cause fever, headache, chills, or upset stomach. More rarely, shingles can lead to pneumonia, hearingproblems, blindness, brain inflammation (encephalitis), or death. The most common complication of shingles is long-term nerve pain called postherpetic neuralgia (PHN). PHN occurs in the areas where the shingles rash was, even after the rash clears up. It can last for months or years after therash goes away. The pain from PHN can be severe and debilitating. About 10 to 18% of people who get shingles will experience PHN. The risk of PHN increases with age. An older adult with shingles is more likely to develop PHN and have longer lasting and more severe pain than a younger person withshingles. Shingles is caused by the varicella zoster virus, the same virus that causes chickenpox. After you have chickenpox, the virus stays in your body and can cause shingles later in life. Shingles cannot be passed from one person to another, but the virus that causes shingles can spread and cause chickenpox insomeone who had never had chickenpox or received chickenpox vaccine. 2. Recombinant shingles vaccine Recombinant shingles vaccine provides strong protection against shingles. Bypreventing shingles, recombinant shingles vaccine also protects against PHN. Recombinant shingles vaccine is the preferred vaccine for the prevention of shingles.   However, a different vaccine, live shingles vaccine, may be used in somecircumstances. The recombinant shingles vaccine is recommended for adults 50 years and older without serious immune problems. It is given as a two-dose series. This vaccine is also recommended for people who have already gotten another type of shingles vaccine, the live shingles vaccine. There is no live virus inthis vaccine. Shingles vaccine may be given at the same  time as other vaccines. 3. Talk with your health care provider Tell your vaccine provider if the person getting the vaccine: Has had an allergic reaction after a previous dose of recombinant shingles vaccine, or has any severe, life-threatening allergies. Is pregnant or breastfeeding. Is currently experiencing an episode of shingles. In some cases, your health care provider may decide to postpone shinglesvaccination to a future visit. People with minor illnesses, such as a cold, may be vaccinated. People who are moderately or severely ill should usually wait until they recover beforegetting recombinant shingles vaccine. Your health care provider can give you more information. 4. Risks of a vaccine reaction A sore arm with mild or moderate pain is very common after recombinant shingles vaccine, affecting about 80% of vaccinated people. Redness and swelling can also happen at the site of the injection. Tiredness, muscle pain, headache, shivering, fever, stomach pain, and nausea happen after vaccination in more than half of people who receive recombinant shingles vaccine. In clinical trials, about 1 out of 6 people who got recombinant zoster vaccine experienced side effects that prevented them from doing regular activities.Symptoms usually went away on their own in 2 to 3 days. You should still get the second dose of recombinant zoster vaccine even if youhad one of these reactions after the first dose. People sometimes faint after medical procedures, including vaccination. Tellyour provider if you feel dizzy or have vision changes or ringing in the ears. As with any medicine, there is a very remote chance of a vaccine causing asevere allergic reaction, other serious injury, or death. 5. What if there is a serious problem? An allergic reaction could occur after the vaccinated person leaves the clinic. If you see signs of a severe allergic reaction (hives, swelling of the face and throat, difficulty  breathing, a fast heartbeat, dizziness, or weakness), call 9-1-1 and get the person to the nearest hospital. For other signs that concern you, call your health care provider. Adverse reactions should be reported to the Vaccine Adverse Event Reporting System (VAERS). Your health care provider will usually file this report, or you can do it yourself. Visit the VAERS website at www.vaers.hhs.gov or call 1-800-822-7967. VAERS is only for reporting reactions, and VAERS staff do not give medical advice. 6. How can I learn more? Ask your health care provider. Call your local or state health department. Contact the Centers for Disease Control and Prevention (CDC): Call 1-800-232-4636 (1-800-CDC-INFO) or Visit CDC's website at www.cdc.gov/vaccines Vaccine Information Statement Recombinant Zoster Vaccine (01/17/2018) This information is not intended to replace advice given to you by your health care provider. Make sure you discuss any questions you have with your healthcare provider. Document Revised: 11/08/2019 Document Reviewed: 11/08/2019 Elsevier Patient Education  2022 Elsevier Inc.  

## 2020-09-14 NOTE — Telephone Encounter (Signed)
Approved for Monovisc, right knee. Buy & Bill Patient will be responsible for 20% OOP. Co-pay of $50.00 Per Patterson Hammersmith PA is approved, call reference# [id number]. [place] Approval#  [id number] Valid 08/27/2020-09/25/2020

## 2020-09-14 NOTE — Progress Notes (Signed)
HPI: Jessica [name] is a 65 y.o. 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  she presents to [facility] today, 09/14/20,  for chief complaint of: Annual physical exam  Dentist: no concerns or regular care Eye exam: UTD, glasses Exercise: stretching intermittently, keeping up with the grandkids Diet: cereal in mornings, skips lunch, dinner at work, appetite low lately Pap smear: about 15 years ago Mammogram: ordering today Colon cancer screening: done about 20 years ago COVID vaccine: Done!  Concerns: None  Past medical, surgical, social and family history reviewed:  Patient Active Problem List   Diagnosis Date Noted   Left insertional Achilles tendinopathy 07/05/2019   Encounter for medication monitoring 09/05/2018   Low mean corpuscular volume (MCV) 09/05/2018   History of iron deficiency anemia 09/05/2018   History of vitamin D deficiency    Ulnar neuropathy at elbow, right 06/05/2018   Leg edema, left 02/23/2018   Angular cheilitis 02/23/2018   Hyperlipidemia associated with type 2 diabetes mellitus ([place]) 02/23/2018   Hypertension associated with diabetes ([place]) 02/23/2018   Diabetic 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 ([place]) 02/23/2018   Epistaxis 09/12/2017   Carpal tunnel syndrome 09/13/2016   Headache, chronic daily 09/13/2016   Acute medial meniscus tear of left knee 08/10/2016   Primary osteoarthritis of left knee 08/10/2016   Tinnitus of both ears 08/10/2016   Hydradenitis 01/18/2016   Primary osteoarthritis of both first carpometacarpal joints 01/06/2016   Insomnia 11/24/2015   Anxiety 07/30/2015   Episode of  recurrent major depressive disorder ([place]) 07/30/2015   Long-term insulin use ([place]) 07/30/2015   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 Dx was Anxiety   HERNIA REPAIR     LEG SURGERY     TUBAL LIGATION      Social History   Tobacco Use   Smoking status: Never   Smokeless tobacco: Never  Substance Use Topics   Alcohol use: No    Family History  Problem Relation Age of Onset   Hyperlipidemia Mother    Hypertension Mother    Stroke Father    Thyroid cancer Sister    Diabetes Maternal Aunt    Skin cancer Maternal Uncle      Current medication list and allergy/intolerance information reviewed:    Current Outpatient Medications  Medication Sig Dispense Refill   aspirin 81 MG tablet Take 81 mg by mouth at bedtime.     ferrous sulfate 325 (65 FE) MG tablet Take 1 tablet (325 mg total) by mouth 2 (two) times daily with a meal. 180 tablet 3   ibuprofen (ADVIL) 800 MG tablet TAKE 1 TABLET (800 MG TOTAL) BY MOUTH EVERY 8 (EIGHT) HOURS AS NEEDED. 60 tablet 2   Insulin Pen Needle 32G X 4 MM MISC INJECT 1 EACH INTO THE SKIN 2 (TWO) TIMES DAILY. 180 each 1   Melatonin 5 MG CHEW Chew 5 mg by mouth 3 times/day as needed-between meals & bedtime.      metFORMIN (GLUCOPHAGE) 1000 MG tablet TAKE 1 TABLET (1,000  MG TOTAL) BY MOUTH 2 (TWO) TIMES DAILY WITH A MEAL. 180 tablet 3   quinapril-hydrochlorothiazide (ACCURETIC) 20-12.5 MG tablet TAKE 2 TABLETS BY MOUTH DAILY. 180 tablet 3   rosuvastatin (CRESTOR) 40 MG tablet TAKE 1 TABLET (40 MG TOTAL) BY MOUTH AT BEDTIME. 90 tablet 3   Specialty Vitamins Products (MAGNESIUM, AMINO ACID CHELATE,) 133 MG tablet Take by mouth.     amLODipine (NORVASC) 5 MG tablet Take 1 tablet (5 mg total) by mouth daily. 90 tablet  1   insulin degludec (TRESIBA) 200 UNIT/ML FlexTouch Pen INJECT 50-80 UNITS INTO THE SKIN AT BEDTIME. INCREASE BY 2 UNITS EVERY 3 DAYS UNTIL FBS CONSISTENTLY 90-130 THEN STAY AT THAT DOSE. 15 mL 11   Semaglutide,0.25 or 0.5MG/DOS, (OZEMPIC, 0.25 OR 0.5 MG/DOSE,) 2 MG/1.5ML SOPN Inject 0.5 mg into the skin once a week. 6 mL 4   sertraline (ZOLOFT) 100 MG tablet TAKE 1 TABLET (100 MG TOTAL) BY MOUTH DAILY. 90 tablet 1   No current facility-administered medications for this visit.    Allergies  Allergen Reactions   Aspirin     Bruising with long term use    Review of Systems: Constitutional:  No  fever, no chills, + recent illness, No unintentional weight changes. No significant fatigue.  HEENT: No  headache, no vision change, no hearing change, No sore throat, + sinus pressure Cardiac: No  chest pain, No  pressure, No palpitations, No  Orthopnea Respiratory:  No  shortness of breath. No  Cough Gastrointestinal: No  abdominal pain, No  nausea, No  vomiting,  No  blood in stool, + dietary related diarrhea, No  constipation  Musculoskeletal: Right knee pain Skin: No  Rash, No other wounds/concerning lesions Genitourinary: No  incontinence, No  abnormal genital bleeding, No abnormal genital discharge Hem/Onc: No  easy bruising/bleeding, No  abnormal lymph node Endocrine: + cold intolerance,  No heat intolerance. No polyuria/polydipsia/polyphagia  Neurologic: No  weakness, No  dizziness, No  slurred speech/focal weakness/facial droop, left hand numbness/tingling Psychiatric: No  concerns with depression, No  concerns with anxiety, + sleep problems, No mood problems  Exam:  BP 116/66   Pulse 65   Temp 98.5 F (36.9 C)   Ht 4' 10.25" (1.48 m)   Wt 210 lb 3.2 oz (95.3 kg)   SpO2 99%   BMI 43.56 kg/m  Constitutional: VS see above. General Appearance: alert, well-developed, well-nourished, NAD Eyes: Normal lids and conjunctive, non-icteric sclera Ears, Nose, Mouth, Throat: MMM, Normal  external inspection ears/nares/mouth/lips/gums. TM normal bilaterally.  Neck: No masses, trachea midline. No thyroid enlargement. No tenderness/mass appreciated. No lymphadenopathy Respiratory: Normal respiratory effort. no wheeze, no rhonchi, no rales Cardiovascular: S1/S2 normal, no murmur, no rub/gallop auscultated. RRR. No lower extremity edema. Pedal pulse II/IV  and PT. No carotid bruit or JVD. No abdominal aortic bruit. Gastrointestinal: Nontender, no masses. No hepatomegaly, no splenomegaly. No hernia appreciated. Bowel sounds normal. Rectal exam deferred.  Musculoskeletal: Gait normal. No clubbing/cyanosis of digits.  Neurological: Normal balance/coordination. No tremor. No cranial nerve deficit on limited exam. Motor and sensation intact and symmetric. Cerebellar reflexes intact.  Skin: warm, dry, intact. No rash/ulcer. No concerning nevi or subq nodules on limited exam.   Psychiatric: Normal judgment/insight. Normal mood and affect. Oriented x3.   Results for orders placed or performed in visit on 09/14/20 (from the past 72 hour(s))  POCT glycosylated hemoglobin (Hb A1C)     Status: Abnormal   Collection Time: 09/14/20  8:59 AM  Result Value  Ref Range   Hemoglobin A1C 6.4 (A) 4.0 - 5.6 %   HbA1c POC (<> result, manual entry)     HbA1c, POC (prediabetic range)     HbA1c, POC (controlled diabetic range)      No results found.   ASSESSMENT/PLAN:   1. Annual physical exam Reviewed labs drawn recently and discussed results with patient.  All questions answered.  2. Encounter for screening mammogram for malignant neoplasm of breast Mammogram ordered. - MM DIGITAL SCREENING BILATERAL; Future  3. Need for shingles vaccine Shingles #1 given today.  Will need to have her second shingles vaccine in 2-6 months. - Varicella-zoster vaccine IM  4. Type 2 diabetes mellitus with microalbuminuria, with long-term current use of insulin (HCC) 5. Diabetic mononeuropathy associated with  type 2 diabetes mellitus (HCC) CT hemoglobin A1c down to 6.4%.  Continue Tresiba and metformin as prescribed. - POCT glycosylated hemoglobin (Hb A1C) - insulin degludec (TRESIBA) 200 UNIT/ML FlexTouch Pen; INJECT 50-80 UNITS INTO THE SKIN AT BEDTIME. INCREASE BY 2 UNITS EVERY 3 DAYS UNTIL FBS CONSISTENTLY 90-130 THEN STAY AT THAT DOSE.  Dispense: 15 mL; Refill: 11  6. Colon cancer screening She is well overdue for colon cancer screening.  She does not want to proceed with a colonoscopy at this point.  Recommended Cologuard as insurance should cover this.  She will consider and let me know if she would like to go forward with this.  7. Hypertension associated with diabetes ([place]) Blood pressure at goal today.  Continue amlodipine 5 mg daily and quinapril-HCTZ 40-25 mg daily. - amLODipine (NORVASC) 5 MG tablet; Take 1 tablet (5 mg total) by mouth daily.  Dispense: 90 tablet; Refill: 1  8. Mild episode of recurrent major depressive disorder (HCC) Continue sertraline 100 mg daily. - sertraline (ZOLOFT) 100 MG tablet; TAKE 1 TABLET (100 MG TOTAL) BY MOUTH DAILY.  Dispense: 90 tablet; Refill: 1  Orders Placed This Encounter  Procedures   MM DIGITAL SCREENING BILATERAL   Varicella-zoster vaccine IM   POCT glycosylated hemoglobin (Hb A1C)    Meds ordered this encounter  Medications   Semaglutide,0.25 or 0.5MG/DOS, (OZEMPIC, 0.25 OR 0.5 MG/DOSE,) 2 MG/1.5ML SOPN    Sig: Inject 0.5 mg into the skin once a week.    Dispense:  6 mL    Refill:  4    Order Specific Question:   Supervising Provider    Answer:   Emeterio Reeve [[id number]]   amLODipine (NORVASC) 5 MG tablet    Sig: Take 1 tablet (5 mg total) by mouth daily.    Dispense:  90 tablet    Refill:  1    Order Specific Question:   Supervising Provider    Answer:   Emeterio Reeve [[id number]]   insulin degludec (TRESIBA) 200 UNIT/ML FlexTouch Pen    Sig: INJECT 50-80 UNITS INTO THE SKIN AT BEDTIME. INCREASE BY 2 UNITS EVERY 3 DAYS  UNTIL FBS CONSISTENTLY 90-130 THEN STAY AT THAT DOSE.    Dispense:  15 mL    Refill:  11    Order Specific Question:   Supervising Provider    Answer:   Emeterio Reeve [[id number]]   sertraline (ZOLOFT) 100 MG tablet    Sig: TAKE 1 TABLET (100 MG TOTAL) BY MOUTH DAILY.    Dispense:  90 tablet    Refill:  1    Order Specific Question:   Supervising Provider    Answer:   Emeterio Reeve [[id number]]    Patient Instructions  Preventive Care 51-55 Years Old, Female Preventive care refers to lifestyle choices and visits with your health care provider that can promote health and wellness. This includes: A yearly physical exam. This is also called an annual wellness visit. Regular dental and eye exams. Immunizations. Screening for certain conditions. Healthy lifestyle choices, such as: Eating a healthy diet. Getting regular exercise. Not using drugs or products that contain nicotine and tobacco. Limiting alcohol use. What can I expect for my preventive care visit? Physical exam Your health care provider will check your: Height and weight. These may be used to calculate your BMI (body mass index). BMI is a measurement that tells if you are at a healthy weight. Heart rate and blood pressure. Body temperature. Skin for abnormal spots. Counseling Your health care provider may ask you questions about your: Past medical problems. Family's medical history. Alcohol, tobacco, and drug use. Emotional well-being. Home life and relationship well-being. Sexual activity. Diet, exercise, and sleep habits. Work and work Statistician. Access to firearms. Method of birth control. Menstrual cycle. Pregnancy history. What immunizations do I need?  Vaccines are usually given at various ages, according to a schedule. Your health care provider will recommend vaccines for you based on your age, medicalhistory, and lifestyle or other factors, such as travel or where you work. What tests do I  need? Blood tests Lipid and cholesterol levels. These may be checked every 5 years, or more often if you are over 72 years old. Hepatitis C test. Hepatitis B test. Screening Lung cancer screening. You may have this screening every year starting at age 63 if you have a 30-pack-year history of smoking and currently smoke or have quit within the past 15 years. Colorectal cancer screening. All adults should have this screening starting at age 19 and continuing until age 33. Your health care provider may recommend screening at age 88 if you are at increased risk. You will have tests every 1-10 years, depending on your results and the type of screening test. Diabetes screening. This is done by checking your blood sugar (glucose) after you have not eaten for a while (fasting). You may have this done every 1-3 years. Mammogram. This may be done every 1-2 years. Talk with your health care provider about when you should start having regular mammograms. This may depend on whether you have a family history of breast cancer. BRCA-related cancer screening. This may be done if you have a family history of breast, ovarian, tubal, or peritoneal cancers. Pelvic exam and Pap test. This may be done every 3 years starting at age 65. Starting at age 75, this may be done every 5 years if you have a Pap test in combination with an HPV test. Other tests STD (sexually transmitted disease) testing, if you are at risk. Bone density scan. This is done to screen for osteoporosis. You may have this scan if you are at high risk for osteoporosis. Talk with your health care provider about your test results, treatment options,and if necessary, the need for more tests. Follow these instructions at home: Eating and drinking  Eat a diet that includes fresh fruits and vegetables, whole grains, lean protein, and low-fat dairy products. Take vitamin and mineral supplements as recommended by your health care provider. Do not  drink alcohol if: Your health care provider tells you not to drink. You are pregnant, may be pregnant, or are planning to become pregnant. If you drink alcohol: Limit how much you have to 0-1 drink a day. Be  aware of how much alcohol is in your drink. In the [place] one drink equals one 12 oz bottle of beer (355 mL), one 5 oz glass of wine (148 mL), or one 1 oz glass of hard liquor (44 mL).  Lifestyle Take daily care of your teeth and gums. Brush your teeth every morning and night with fluoride toothpaste. Floss one time each day. Stay active. Exercise for at least 30 minutes 5 or more days each week. Do not use any products that contain nicotine or tobacco, such as cigarettes, e-cigarettes, and chewing tobacco. If you need help quitting, ask your health care provider. Do not use drugs. If you are sexually active, practice safe sex. Use a condom or other form of protection to prevent STIs (sexually transmitted infections). If you do not wish to become pregnant, use a form of birth control. If you plan to become pregnant, see your health care provider for a prepregnancy visit. If told by your health care provider, take low-dose aspirin daily starting at age 108. Find healthy ways to cope with stress, such as: Meditation, yoga, or listening to music. Journaling. Talking to a trusted person. Spending time with friends and family. Safety Always wear your seat belt while driving or riding in a vehicle. Do not drive: If you have been drinking alcohol. Do not ride with someone who has been drinking. When you are tired or distracted. While texting. Wear a helmet and other protective equipment during sports activities. If you have firearms in your house, make sure you follow all gun safety procedures. What's next? Visit your health care provider once a year for an annual wellness visit. Ask your health care provider how often you should have your eyes and teeth checked. Stay up to date on all  vaccines. This information is not intended to replace advice given to you by your health care provider. Make sure you discuss any questions you have with your healthcare provider. Document Revised: 12/10/2019 Document Reviewed: 11/16/2017 Elsevier Patient Education  2022 [place]. Recombinant Zoster (Shingles) Vaccine: What You Need to Know 1. Why get vaccinated? Recombinant zoster (shingles) vaccine can prevent shingles. Shingles (also called herpes zoster, or just zoster) is a painful skin rash, usually with blisters. In addition to the rash, shingles can cause fever, headache, chills, or upset stomach. More rarely, shingles can lead to pneumonia, hearingproblems, blindness, brain inflammation (encephalitis), or death. The most common complication of shingles is long-term nerve pain called postherpetic neuralgia (PHN). PHN occurs in the areas where the shingles rash was, even after the rash clears up. It can last for months or years after therash goes away. The pain from PHN can be severe and debilitating. About 10 to 18% of people who get shingles will experience PHN. The risk of PHN increases with age. An older adult with shingles is more likely to develop PHN and have longer lasting and more severe pain than a younger person withshingles. Shingles is caused by the varicella zoster virus, the same virus that causes chickenpox. After you have chickenpox, the virus stays in your body and can cause shingles later in life. Shingles cannot be passed from one person to another, but the virus that causes shingles can spread and cause chickenpox insomeone who had never had chickenpox or received chickenpox vaccine. 2. Recombinant shingles vaccine Recombinant shingles vaccine provides strong protection against shingles. Bypreventing shingles, recombinant shingles vaccine also protects against PHN. Recombinant shingles vaccine is the preferred vaccine for the prevention of shingles. However, a  different  vaccine, live shingles vaccine, may be used in somecircumstances. The recombinant shingles vaccine is recommended for adults 50 years and older without serious immune problems. It is given as a two-dose series. This vaccine is also recommended for people who have already gotten another type of shingles vaccine, the live shingles vaccine. There is no live virus inthis vaccine. Shingles vaccine may be given at the same time as other vaccines. 3. Talk with your health care provider Tell your vaccine provider if the person getting the vaccine: Has had an allergic reaction after a previous dose of recombinant shingles vaccine, or has any severe, life-threatening allergies. Is pregnant or breastfeeding. Is currently experiencing an episode of shingles. In some cases, your health care provider may decide to postpone shinglesvaccination to a future visit. People with minor illnesses, such as a cold, may be vaccinated. People who are moderately or severely ill should usually wait until they recover beforegetting recombinant shingles vaccine. Your health care provider can give you more information. 4. Risks of a vaccine reaction A sore arm with mild or moderate pain is very common after recombinant shingles vaccine, affecting about 80% of vaccinated people. Redness and swelling can also happen at the site of the injection. Tiredness, muscle pain, headache, shivering, fever, stomach pain, and nausea happen after vaccination in more than half of people who receive recombinant shingles vaccine. In clinical trials, about 1 out of 6 people who got recombinant zoster vaccine experienced side effects that prevented them from doing regular activities.Symptoms usually went away on their own in 2 to 3 days. You should still get the second dose of recombinant zoster vaccine even if youhad one of these reactions after the first dose. People sometimes faint after medical procedures, including vaccination. Tellyour  provider if you feel dizzy or have vision changes or ringing in the ears. As with any medicine, there is a very remote chance of a vaccine causing asevere allergic reaction, other serious injury, or death. 5. What if there is a serious problem? An allergic reaction could occur after the vaccinated person leaves the clinic. If you see signs of a severe allergic reaction (hives, swelling of the face and throat, difficulty breathing, a fast heartbeat, dizziness, or weakness), call 9-1-1 and get the person to the nearest hospital. For other signs that concern you, call your health care provider. Adverse reactions should be reported to the Vaccine Adverse Event Reporting System (VAERS). Your health care provider will usually file this report, or you can do it yourself. Visit the VAERS website at www.vaers.SamedayNews.es or call (352) 427-1939. VAERS is only for reporting reactions, and VAERS staff do not give medical advice. 6. How can I learn more? Ask your health care provider. Call your local or state health department. Contact the Centers for Disease Control and Prevention (CDC): Call 774-156-7002 (1-800-CDC-INFO) or Visit CDC's website at http://hunter.com/ Vaccine Information Statement Recombinant Zoster Vaccine (01/17/2018) This information is not intended to replace advice given to you by your health care provider. Make sure you discuss any questions you have with your healthcare provider. Document Revised: 11/08/2019 Document Reviewed: 11/08/2019 Elsevier Patient Education  [place].  Follow-up plan: Return in about 6 months (around 03/16/2021) for DM/HTN/HLD follow up.  Clearnce Sorrel, DNP, APRN, FNP-BC [facility]

## 2020-09-24 ENCOUNTER — Ambulatory Visit: Payer: No Typology Code available for payment source | Admitting: Physician Assistant

## 2020-09-28 ENCOUNTER — Ambulatory Visit: Payer: No Typology Code available for payment source | Admitting: Physician Assistant

## 2020-09-28 ENCOUNTER — Telehealth: Payer: Self-pay

## 2020-09-28 NOTE — Telephone Encounter (Signed)
Talked with Genesis C.at [employer] to get an extension for previous approval due to patient not being available to schedule within the previous time frame.   Per Genesis C., PA will be resubmitted for extension. Pending PA# [id number]

## 2020-09-30 ENCOUNTER — Telehealth: Payer: Self-pay

## 2020-09-30 NOTE — Telephone Encounter (Signed)
Received extended date of service PA approval from [employer] for Monovisc, right knee. PA Approval# [id number] Valid 08/27/2020- 10/16/2020  Appt. 10/05/2020

## 2020-10-05 ENCOUNTER — Encounter: Payer: Self-pay | Admitting: Physician Assistant

## 2020-10-05 ENCOUNTER — Ambulatory Visit (INDEPENDENT_AMBULATORY_CARE_PROVIDER_SITE_OTHER): Payer: No Typology Code available for payment source | Admitting: Physician Assistant

## 2020-10-05 DIAGNOSIS — M1711 Unilateral primary osteoarthritis, right knee: Secondary | ICD-10-CM

## 2020-10-05 MED ORDER — HYALURONAN 88 MG/4ML IX SOSY
88.0000 mg | PREFILLED_SYRINGE | INTRA_ARTICULAR | Status: AC | PRN
  Administered 2020-10-05: 88 mg via INTRA_ARTICULAR

## 2020-10-05 NOTE — Progress Notes (Signed)
   Procedure Note  Patient: Jessica [name]             Date of Birth: [birth year]           MRN: [id number]             Visit Date: 10/05/2020 HPI: Ms. Reali returns today for Monovisc injection right knee.  She has known tricompartmental arthritis of the right knee.  No known injury.  MRI of the right knee show tricompartmental changes with high-grade partial cartilage loss involving the medial compartment and patellofemoral compartment.  Lateral compartment with partial thickness cartilage loss.  She has failed conservative treatment which is included Voltaren gel and quad strengthening.  Also the use of a cane.  She is unable to undergo a cortisone injection for the knee due to the fact that raises her glucose levels considerably.  Physical exam: Right knee no abnormal warmth erythema or effusion.  Overall good range of motion of the knee.  Patellofemoral crepitus with passive range of motion.  Procedures: Visit Diagnoses:  1. Primary osteoarthritis of right knee     Large Joint Inj: R knee on 10/05/2020 8:41 AM Indications: pain Details: 22 G 1.5 in needle, superolateral approach  Arthrogram: No  Medications: 88 mg Hyaluronan 88 MG/4ML Outcome: tolerated well, no immediate complications Procedure, treatment alternatives, risks and benefits explained, specific risks discussed. Consent was given by the patient. Immediately prior to procedure a time out was called to verify the correct patient, procedure, equipment, support staff and site/side marked as required. Patient was prepped and draped in the usual sterile fashion.    Plan: She understands to wait at least 6 months between supplemental injections.  We will see her back in 8 weeks see what type of response she had to the injection.  Questions were encouraged and answered at length.  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

## 2020-10-14 ENCOUNTER — Ambulatory Visit (INDEPENDENT_AMBULATORY_CARE_PROVIDER_SITE_OTHER): Payer: No Typology Code available for payment source

## 2020-10-14 ENCOUNTER — Other Ambulatory Visit: Payer: Self-pay

## 2020-10-14 DIAGNOSIS — Z1231 Encounter for screening mammogram for malignant neoplasm of breast: Secondary | ICD-10-CM | POA: Diagnosis not present

## 2020-10-14 IMAGING — MG MM DIGITAL SCREENING BILAT W/ TOMO AND CAD
8 series · 8 of 24 positions shown · non-contrast
Comparison: Previous exam(s).

CLINICAL DATA: Screening.

EXAM:
DIGITAL SCREENING BILATERAL MAMMOGRAM WITH TOMOSYNTHESIS AND CAD
TECHNIQUE: Bilateral screening digital craniocaudal and mediolateral oblique
mammograms were obtained. Bilateral screening digital breast
tomosynthesis was performed. The images were evaluated with
computer-aided detection.

[L MLO synth-2D]
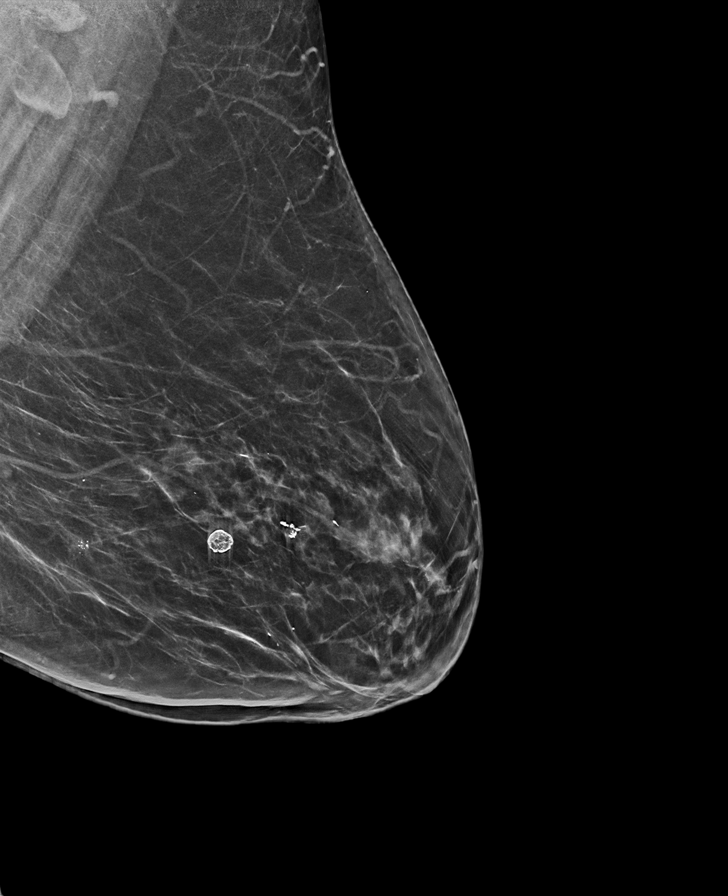

[R MLO synth-2D]
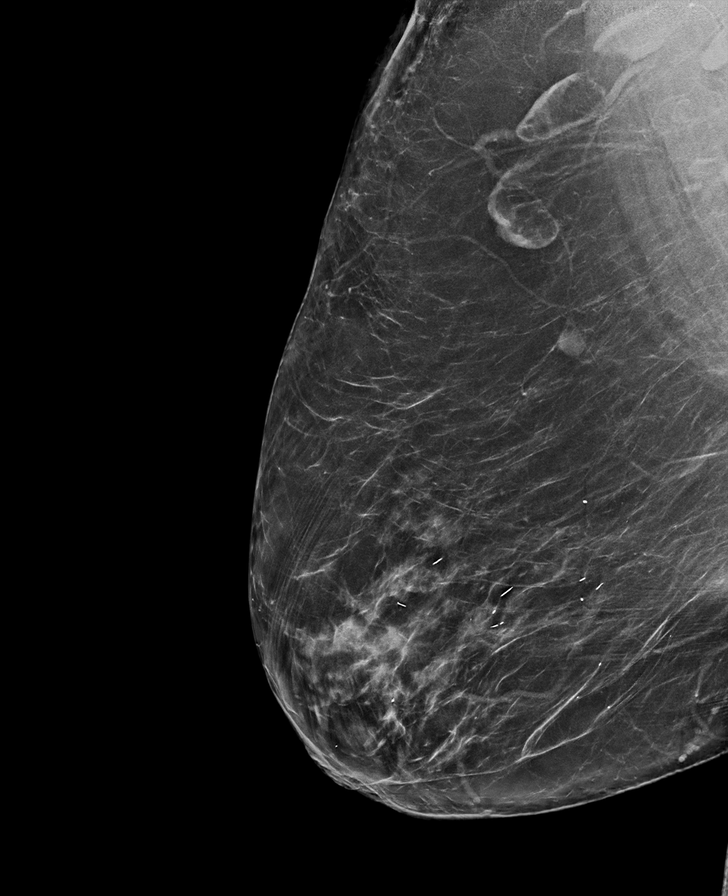

[L CC synth-2D]
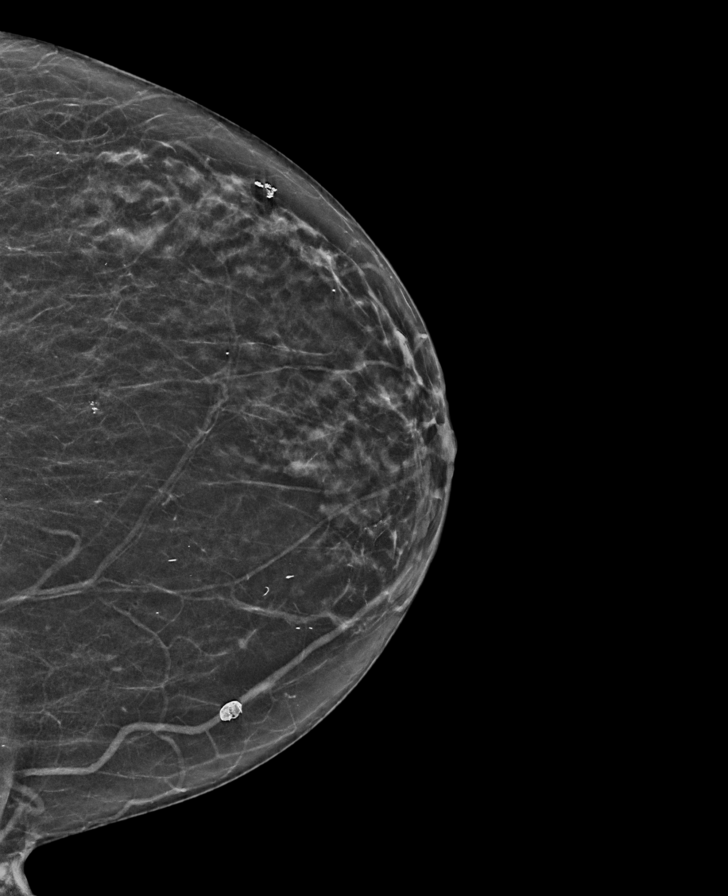

[R CC synth-2D]
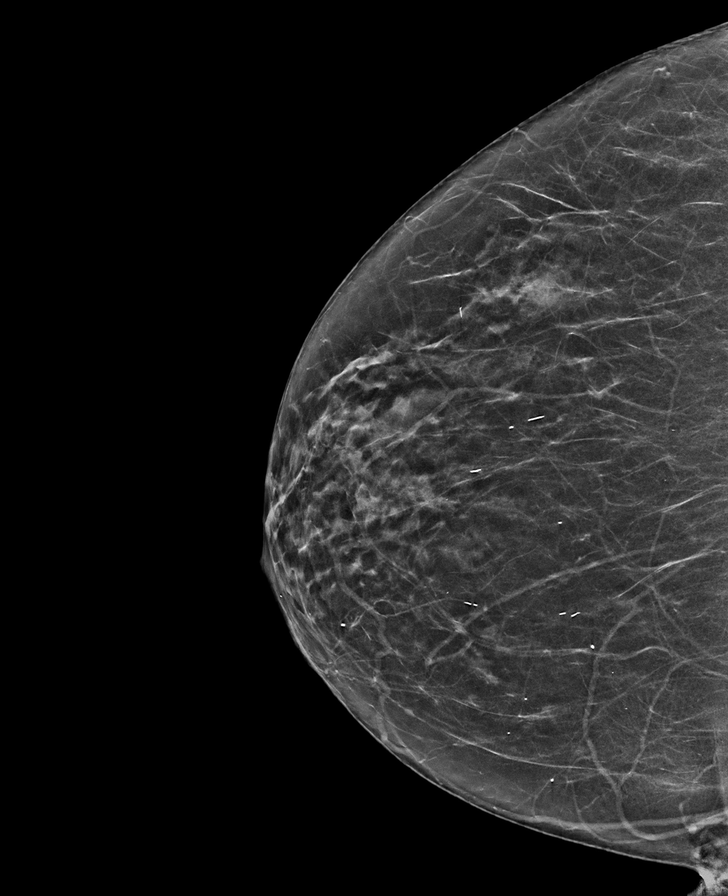

[L CC tomo · tomo slice 26/51.0]
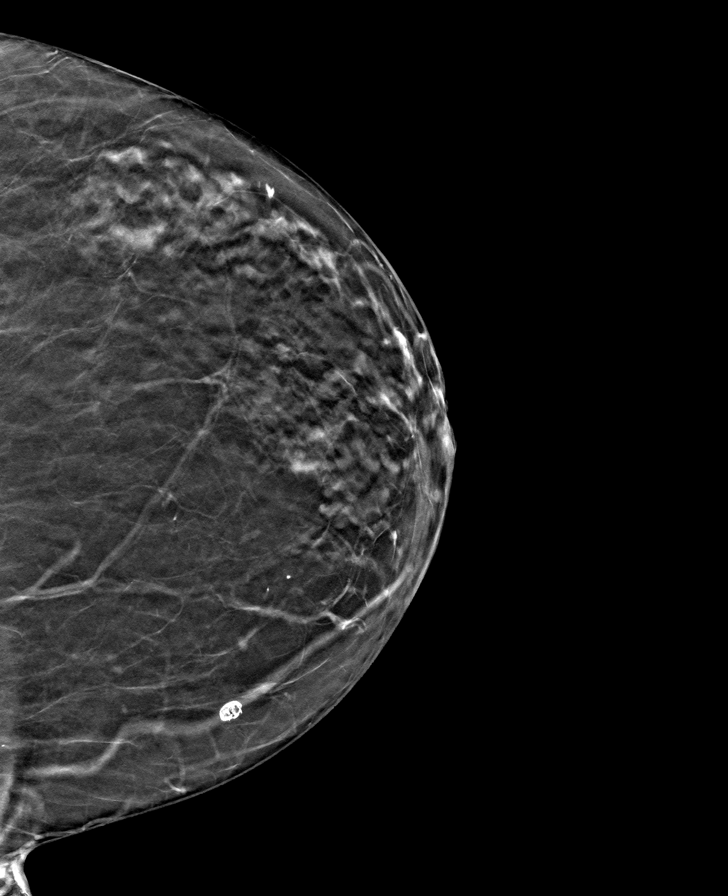

[L MLO tomo · tomo slice 37/72.0]
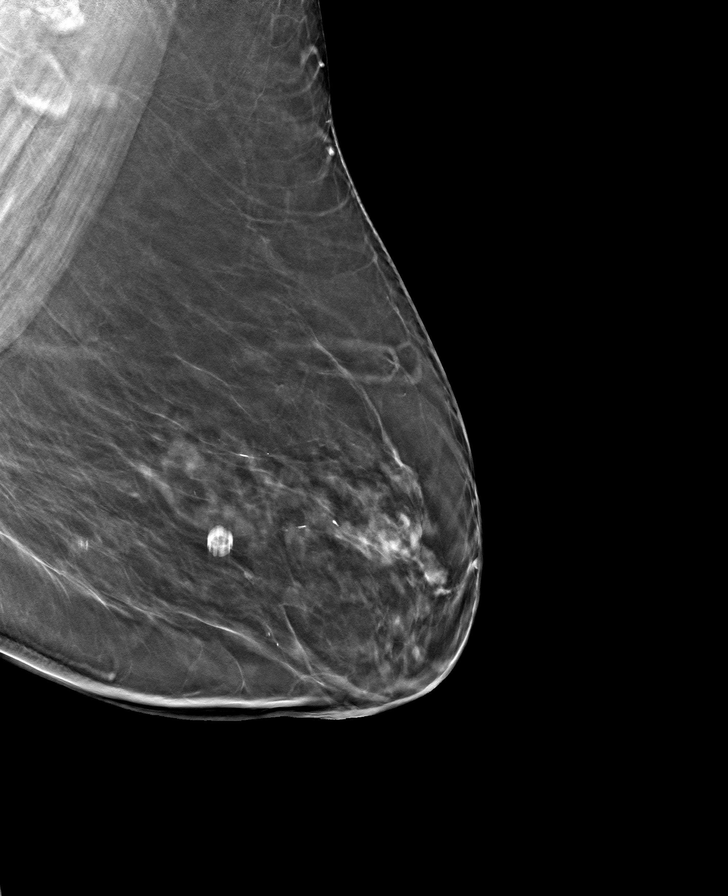

[R MLO tomo · tomo slice 41/82.0]
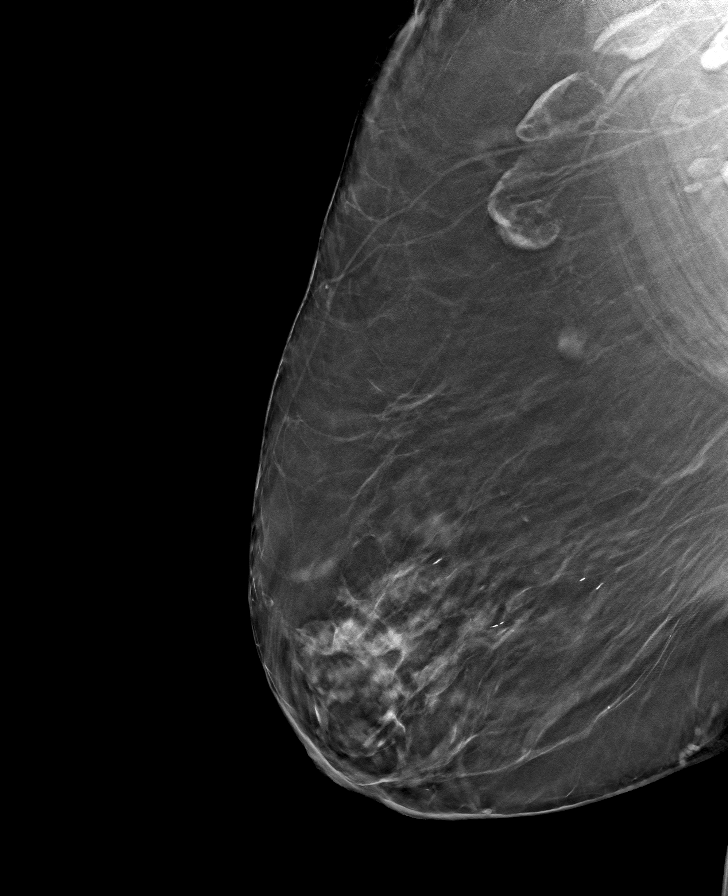

[R CC tomo · tomo slice 31/60.0]
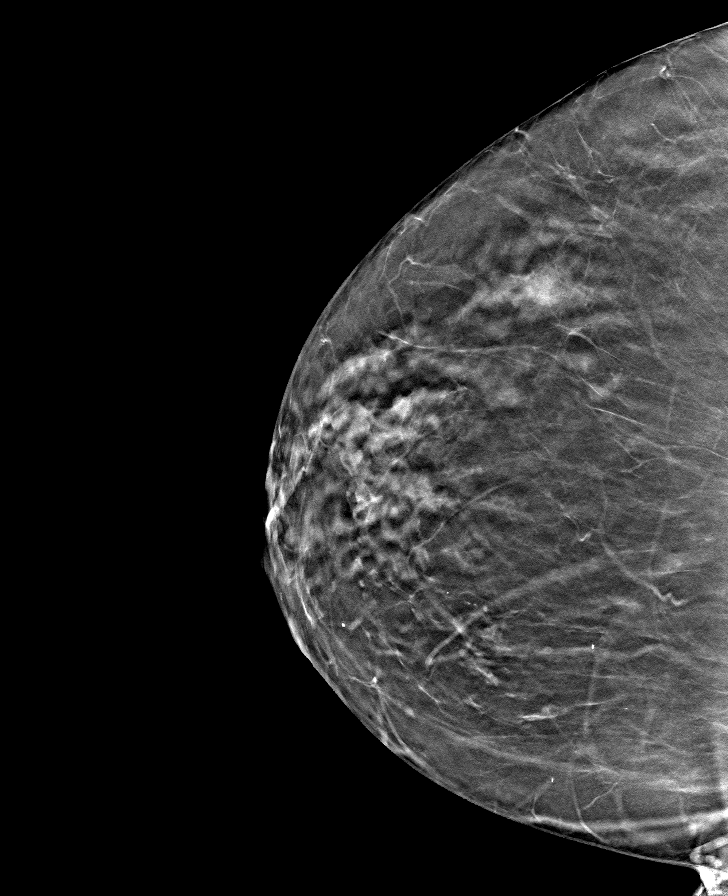

[8 of 24 positions shown; findings below may reference images not displayed]

ACR Breast Density Category b: There are scattered areas of
fibroglandular density.
FINDINGS: There are no findings suspicious for malignancy.
IMPRESSION: No mammographic evidence of malignancy. A result letter of this
screening mammogram will be mailed directly to the patient.

RECOMMENDATION:
Screening mammogram in one year. (Code:[BY])

BI-RADS CATEGORY  1: Negative.

## 2020-10-21 MED FILL — Metformin HCl Tab 1000 MG: ORAL | 90 days supply | Qty: 180 | Fill #1 | Status: AC

## 2020-10-21 MED FILL — Rosuvastatin Calcium Tab 40 MG: ORAL | 90 days supply | Qty: 90 | Fill #1 | Status: AC

## 2020-10-21 MED FILL — Quinapril-Hydrochlorothiazide Tab 20-12.5 MG: ORAL | 90 days supply | Qty: 180 | Fill #1 | Status: AC

## 2020-10-22 ENCOUNTER — Other Ambulatory Visit (HOSPITAL_BASED_OUTPATIENT_CLINIC_OR_DEPARTMENT_OTHER): Payer: Self-pay

## 2020-11-30 ENCOUNTER — Ambulatory Visit: Payer: No Typology Code available for payment source | Admitting: Physician Assistant

## 2020-12-04 ENCOUNTER — Other Ambulatory Visit (HOSPITAL_BASED_OUTPATIENT_CLINIC_OR_DEPARTMENT_OTHER): Payer: Self-pay

## 2020-12-14 ENCOUNTER — Ambulatory Visit (INDEPENDENT_AMBULATORY_CARE_PROVIDER_SITE_OTHER): Payer: No Typology Code available for payment source | Admitting: Physician Assistant

## 2020-12-14 ENCOUNTER — Encounter: Payer: Self-pay | Admitting: Physician Assistant

## 2020-12-14 ENCOUNTER — Other Ambulatory Visit: Payer: Self-pay

## 2020-12-14 DIAGNOSIS — M545 Low back pain, unspecified: Secondary | ICD-10-CM

## 2020-12-14 DIAGNOSIS — M1711 Unilateral primary osteoarthritis, right knee: Secondary | ICD-10-CM | POA: Diagnosis not present

## 2020-12-14 NOTE — Progress Notes (Signed)
HPI: Jessica [name] returns today 8 weeks status post right knee supplemental injection.  She states overall the knee is doing better particularly 3 weeks slightly little worse since then.  She has problems when going up and down inclines though.  She does feel one leg is shorter than the other causing left low back pain.  No radicular symptoms down the left leg.  She has been using Voltaren gel over the lumbar spine region and this is helping.  Review of systems see HPI otherwise negative  Physical exam: Bilateral knees excellent range of motion both knees.  Right knee with obvious valgus deformity.  Patellofemoral crepitus right knee with passive range of motion.  No instability valgus varus stressing either knee.  No abnormal warmth erythema or effusion of either knee.  Good range of motion bilateral hips.  Nontender trochanteric region left hip.  Lower extremities 5 out of 5 strength throughout against resistance.  Negative straight leg raise bilaterally.  She has full flexion of the lumbar spine without pain.  Slightly limited extension lumbar spine.  Leg lengths are equal.  Impression: Right knee osteoarthritis Lumbar pain without radicular pain  Plan: She understands to wait at least 6 months between supplemental injections in the right knee and knows to call 2 to 3 weeks prior to wanting repeat injection.  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  In regards to her back offered her formal therapy she defers.  Therefore she is given back exercise handouts that she can perform on her own.  Follow-up if symptoms become worse or do not improve.  Questions were encouraged and answered.  Reassurance was given that her leg lengths are equal and that she should eat.  Quit using the left in the left shoe or replace it left in both shoes.

## 2020-12-16 ENCOUNTER — Other Ambulatory Visit (HOSPITAL_BASED_OUTPATIENT_CLINIC_OR_DEPARTMENT_OTHER): Payer: Self-pay

## 2020-12-16 MED FILL — Metformin HCl Tab 1000 MG: ORAL | 90 days supply | Qty: 180 | Fill #2 | Status: CN

## 2021-01-01 ENCOUNTER — Other Ambulatory Visit (HOSPITAL_BASED_OUTPATIENT_CLINIC_OR_DEPARTMENT_OTHER): Payer: Self-pay

## 2021-01-01 MED FILL — Metformin HCl Tab 1000 MG: ORAL | 90 days supply | Qty: 180 | Fill #2 | Status: AC

## 2021-01-05 ENCOUNTER — Other Ambulatory Visit (HOSPITAL_BASED_OUTPATIENT_CLINIC_OR_DEPARTMENT_OTHER): Payer: Self-pay

## 2021-01-28 ENCOUNTER — Other Ambulatory Visit (HOSPITAL_BASED_OUTPATIENT_CLINIC_OR_DEPARTMENT_OTHER): Payer: Self-pay

## 2021-01-28 ENCOUNTER — Other Ambulatory Visit: Payer: Self-pay | Admitting: Medical-Surgical

## 2021-01-28 DIAGNOSIS — I152 Hypertension secondary to endocrine disorders: Secondary | ICD-10-CM

## 2021-01-28 DIAGNOSIS — E1159 Type 2 diabetes mellitus with other circulatory complications: Secondary | ICD-10-CM

## 2021-01-28 MED ORDER — QUINAPRIL-HYDROCHLOROTHIAZIDE 20-12.5 MG PO TABS
2.0000 | ORAL_TABLET | Freq: Every day | ORAL | 0 refills | Status: DC
  Filled 2021-01-28: qty 90, 45d supply, fill #0

## 2021-02-04 ENCOUNTER — Ambulatory Visit (INDEPENDENT_AMBULATORY_CARE_PROVIDER_SITE_OTHER): Payer: No Typology Code available for payment source | Admitting: Sports Medicine

## 2021-02-04 ENCOUNTER — Other Ambulatory Visit (HOSPITAL_BASED_OUTPATIENT_CLINIC_OR_DEPARTMENT_OTHER): Payer: Self-pay

## 2021-02-04 ENCOUNTER — Ambulatory Visit (INDEPENDENT_AMBULATORY_CARE_PROVIDER_SITE_OTHER): Payer: No Typology Code available for payment source

## 2021-02-04 ENCOUNTER — Other Ambulatory Visit: Payer: Self-pay

## 2021-02-04 DIAGNOSIS — G8929 Other chronic pain: Secondary | ICD-10-CM | POA: Diagnosis not present

## 2021-02-04 DIAGNOSIS — M79672 Pain in left foot: Secondary | ICD-10-CM

## 2021-02-04 IMAGING — DX DG OS CALCIS 2+V*L*
2 series · 2 of 2 positions shown · non-contrast
Comparison: Left calcaneus x-ray [DATE]

CLINICAL DATA: Persistent pain.

EXAM:
LEFT OS CALCIS - 2+ VIEW

[calcaneus axial]
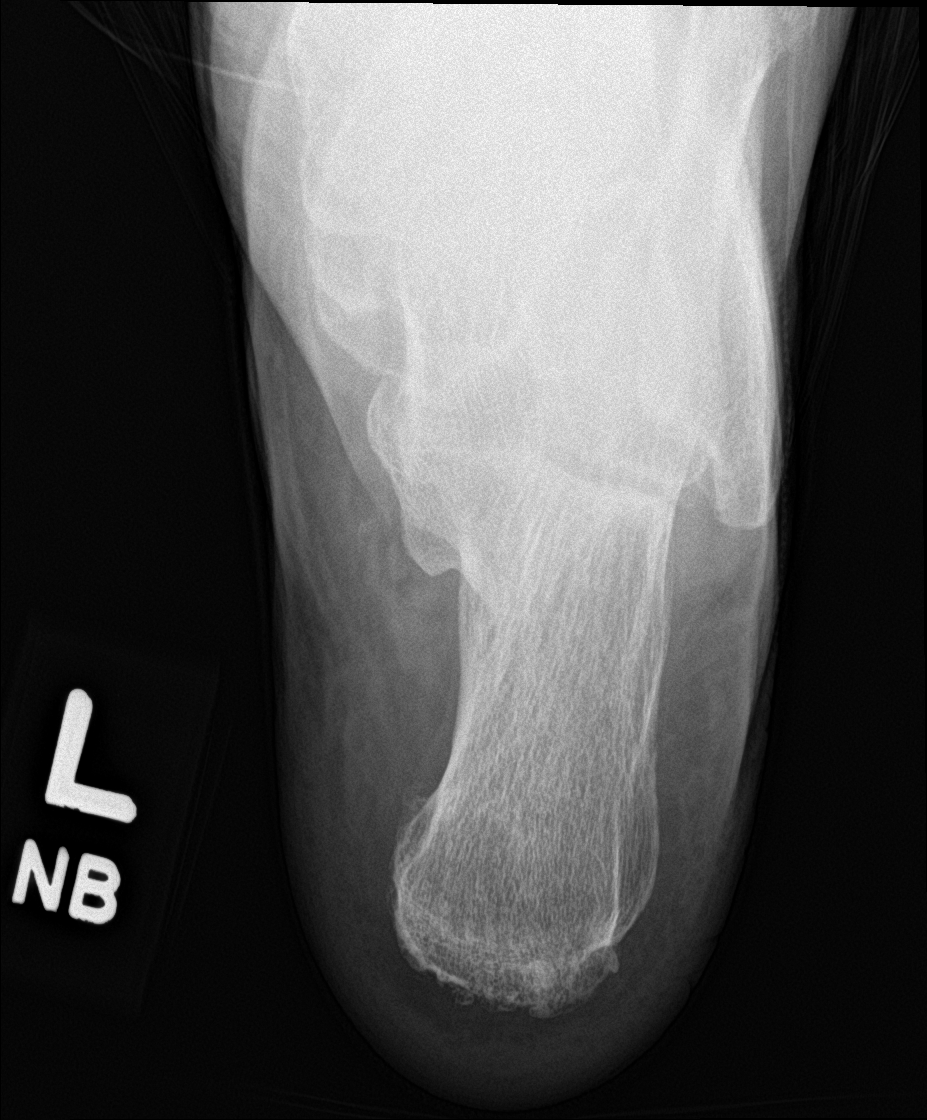

[calcaneus lat]
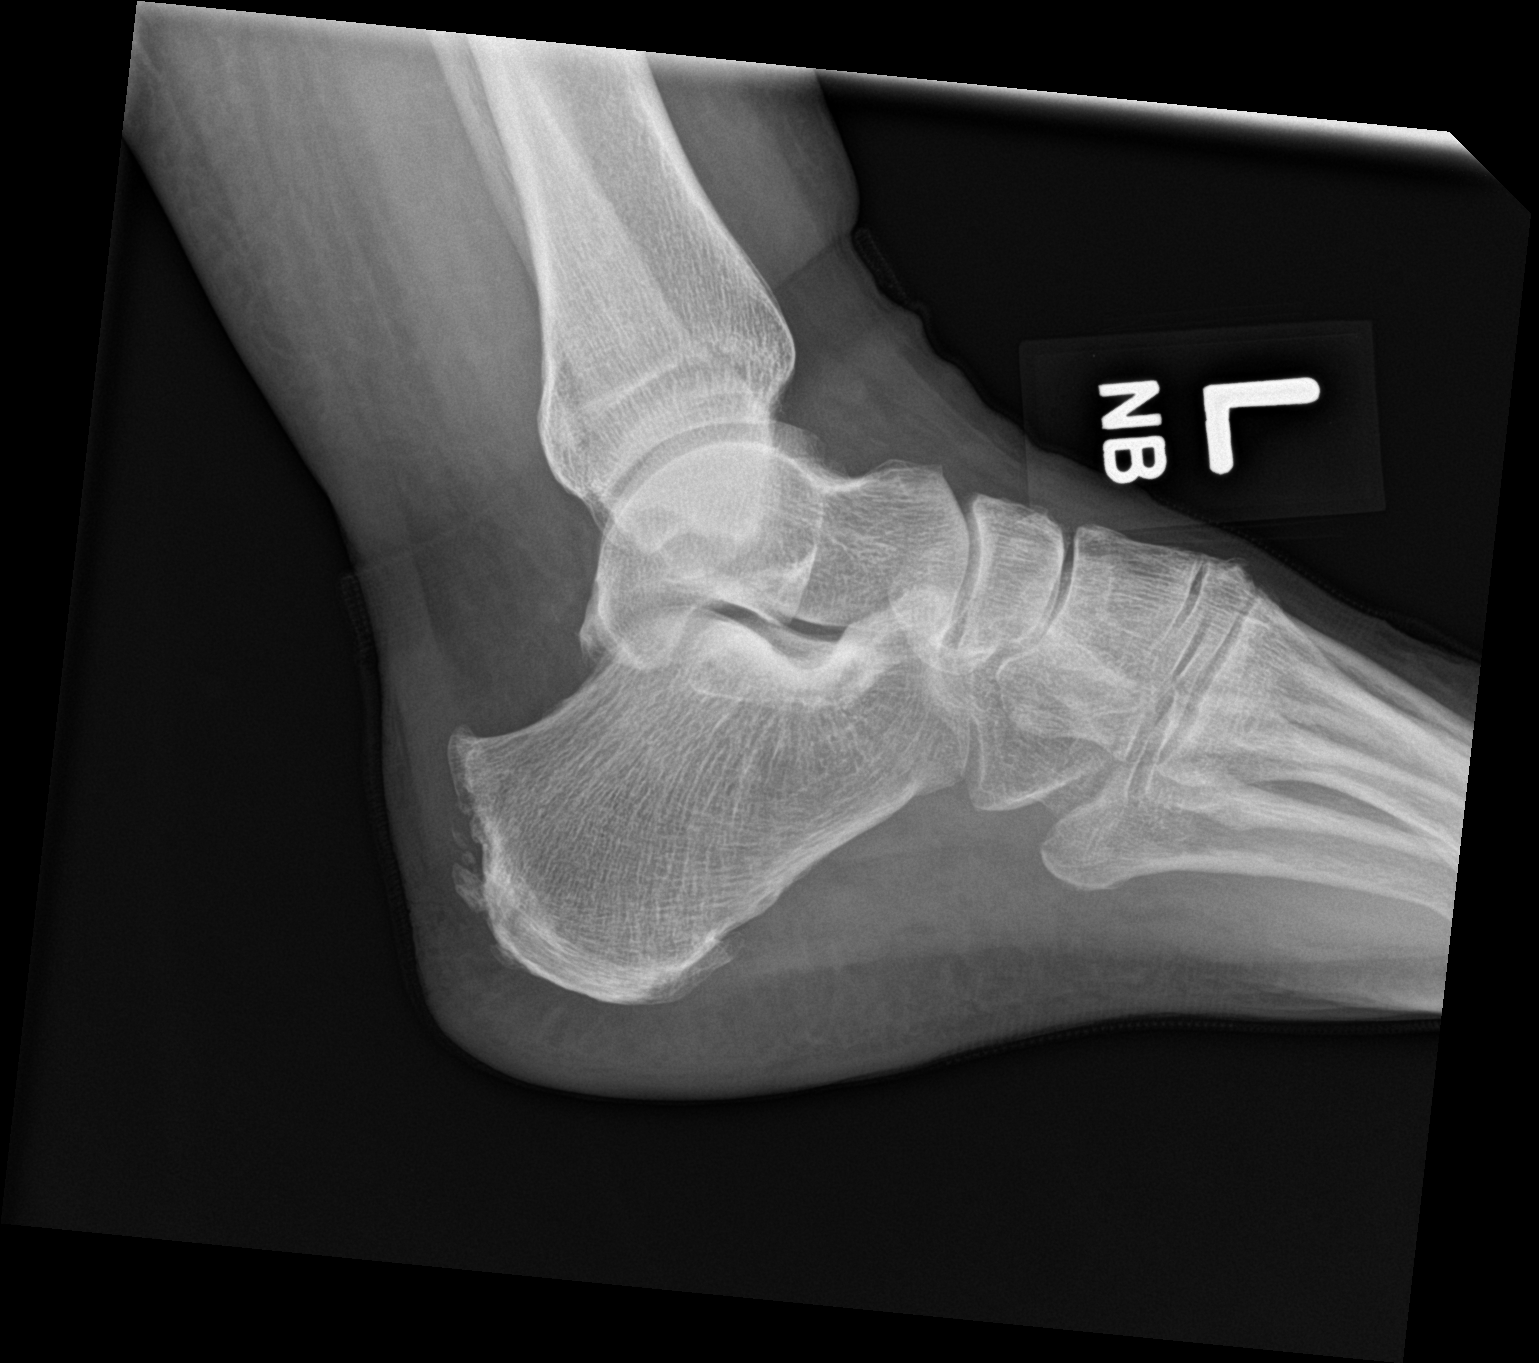

[2 of 2 positions shown; findings below may reference images not displayed]

FINDINGS: There is no evidence for fracture. Joint spaces are maintained.
There is minimal spurring of the posterior and plantar calcaneus,
unchanged from the prior examination. No cortical erosions. Soft
tissues are within normal limits.
IMPRESSION: No acute findings.  No significant interval change.

## 2021-02-04 MED ORDER — TRAMADOL HCL 50 MG PO TABS
50.0000 mg | ORAL_TABLET | Freq: Three times a day (TID) | ORAL | 0 refills | Status: DC | PRN
  Filled 2021-02-04: qty 21, 4d supply, fill #0

## 2021-02-04 NOTE — Assessment & Plan Note (Signed)
This is a pleasant 65 year old female, I last saw her about a year and a half ago for insertional Achilles tendinosis left side, we have been treating with heel lifts, NSAIDs since then, unfortunately she has had continued on and off pain over the last several months, in spite of conservative treatment including NSAIDs, Tylenol, activity modification. On exam she has tenderness at the Achilles insertion, plantar fascia insertion, laterally and medially over the calcaneus with a positive squeeze test. Very little fat pad on the plantar heel. I do suspect she has a calcaneal stress injury, any x-rays, considering greater than 6 weeks of conservative treatment without improvement we will proceed with heel MRI as well, adding a cam boot, tramadol for pain in the meantime. Return to see me in 6 weeks, she understands this may take 6 to 12 weeks to heal.

## 2021-02-04 NOTE — Progress Notes (Signed)
    Procedures performed today:    None.  Independent interpretation of notes and tests performed by another provider:   None.  Brief History, Exam, Impression, and Recommendations:    Chronic heel pain, left This is a pleasant 65 year old female, I last saw her about a year and a half ago for insertional Achilles tendinosis left side, we have been treating with heel lifts, NSAIDs since then, unfortunately she has had continued on and off pain over the last several months, in spite of conservative treatment including NSAIDs, Tylenol, activity modification. On exam she has tenderness at the Achilles insertion, plantar fascia insertion, laterally and medially over the calcaneus with a positive squeeze test. Very little fat pad on the plantar heel. I do suspect she has a calcaneal stress injury, any x-rays, considering greater than 6 weeks of conservative treatment without improvement we will proceed with heel MRI as well, adding a cam boot, tramadol for pain in the meantime. Return to see me in 6 weeks, she understands this may take 6 to 12 weeks to heal.  Chronic process with exacerbation and pharmacologic intervention  ___________________________________________ Ihor Austin. Benjamin Stain, M.D., ABFM., CAQSM. Primary Care and Sports Medicine [facility]  Adjunct Instructor of Family Medicine  [employer]

## 2021-02-18 ENCOUNTER — Other Ambulatory Visit (HOSPITAL_BASED_OUTPATIENT_CLINIC_OR_DEPARTMENT_OTHER): Payer: Self-pay

## 2021-02-19 ENCOUNTER — Other Ambulatory Visit (HOSPITAL_BASED_OUTPATIENT_CLINIC_OR_DEPARTMENT_OTHER): Payer: Self-pay

## 2021-02-20 ENCOUNTER — Ambulatory Visit (INDEPENDENT_AMBULATORY_CARE_PROVIDER_SITE_OTHER): Payer: No Typology Code available for payment source

## 2021-02-20 ENCOUNTER — Other Ambulatory Visit: Payer: Self-pay

## 2021-02-20 DIAGNOSIS — M25572 Pain in left ankle and joints of left foot: Secondary | ICD-10-CM | POA: Diagnosis not present

## 2021-02-20 DIAGNOSIS — M79672 Pain in left foot: Secondary | ICD-10-CM

## 2021-02-20 DIAGNOSIS — G8929 Other chronic pain: Secondary | ICD-10-CM | POA: Diagnosis not present

## 2021-02-20 IMAGING — MR MR HEEL *L* W/O CM
5 series · 40 of 40 positions shown · non-contrast
Comparison: X-ray [DATE]

CLINICAL DATA: Ankle pain, stress fracture suspected, neg xray
Persistent pain around the entirety of the left heel, question
calcaneal stress fracture

EXAM:
MR OF THE LEFT HEEL WITHOUT CONTRAST
TECHNIQUE: Multiplanar, multisequence MR imaging of the left heel was
performed. No intravenous contrast was administered.

[Series 3: T2 fat-sat · axial · 3.0mm · 0.50mm/px · z∈[-107,+8]mm · 9 of 30 slices shown (1 of 2)]
[im 1/30]
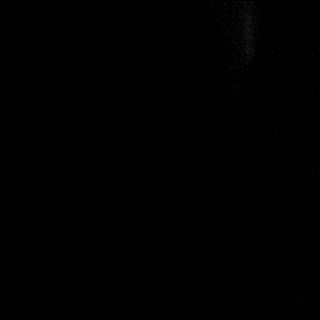
[im 4/30]
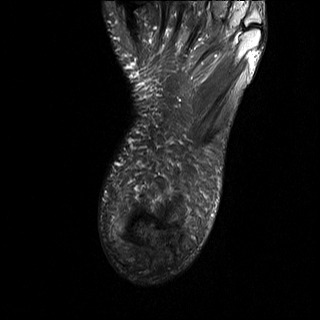
[im 8/30]
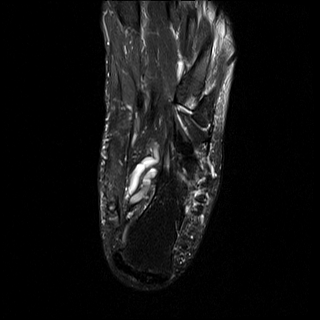
[im 11/30]
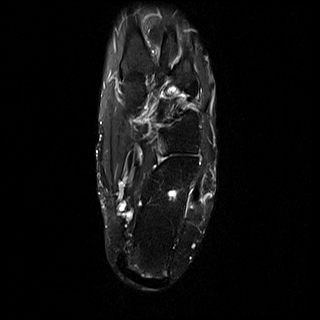
[im 15/30]
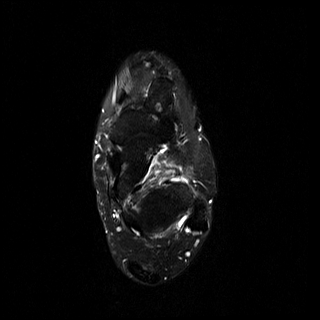
[im 19/30]
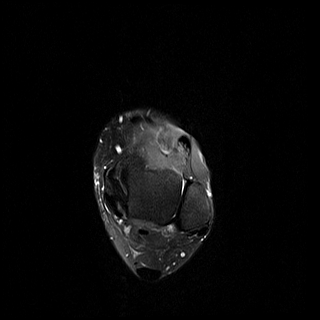
[im 22/30]
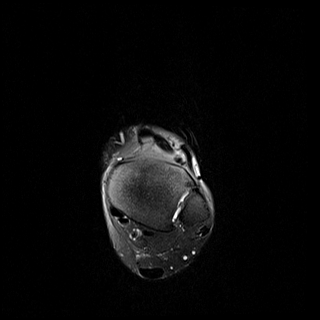
[im 26/30]
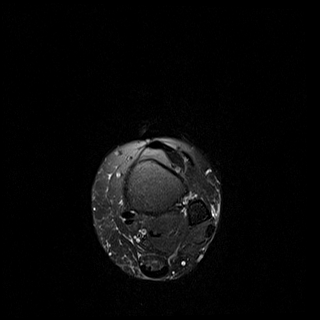
[im 30/30]
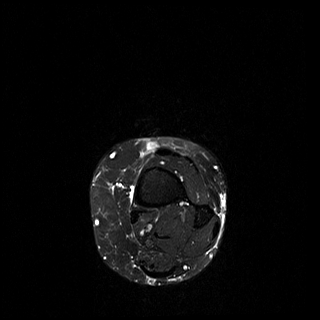

[Series 4: PD fat-sat · axial · 3.0mm · 0.62mm/px · z∈[-107,+8]mm · 8 of 30 slices shown]
[im 1/30]
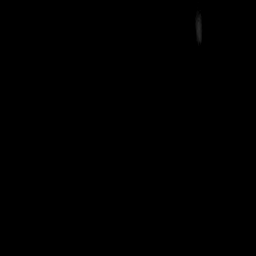
[im 5/30]
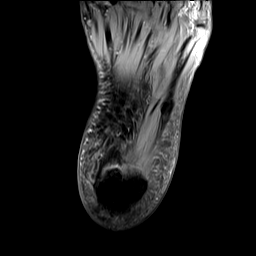
[im 9/30]
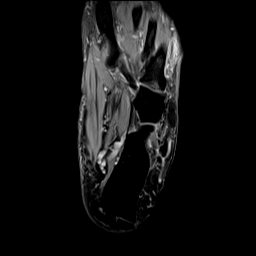
[im 13/30]
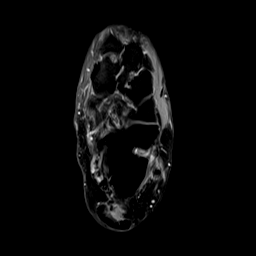
[im 17/30]
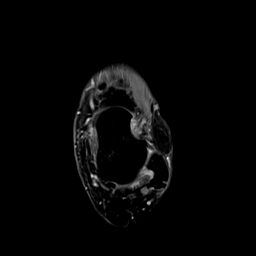
[im 21/30]
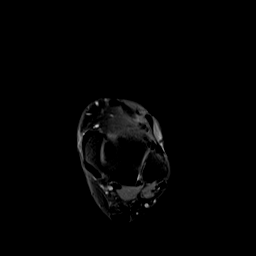
[im 25/30]
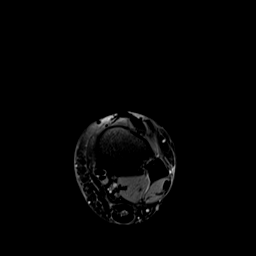
[im 30/30]
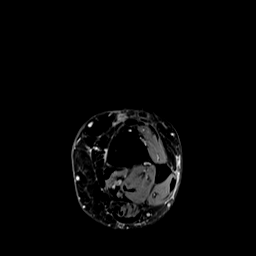

[Series 5: T2 fat-sat · coronal · 3.0mm · 0.62mm/px · 9 of 32 slices shown (2 of 2)]
[im 1/32]
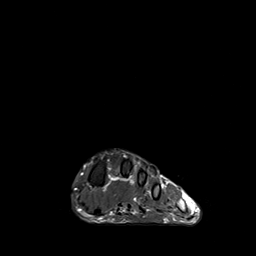
[im 4/32]
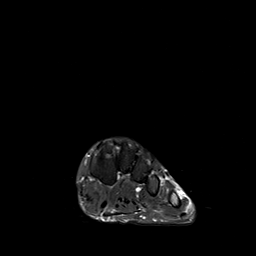
[im 8/32]
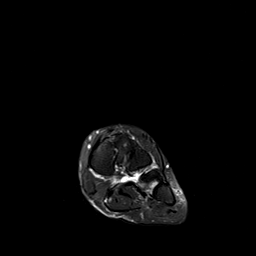
[im 12/32]
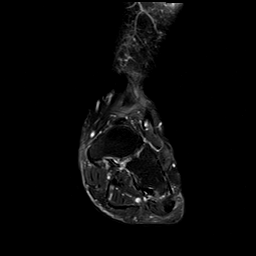
[im 16/32]
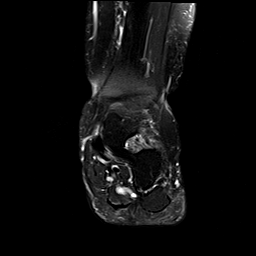
[im 20/32]
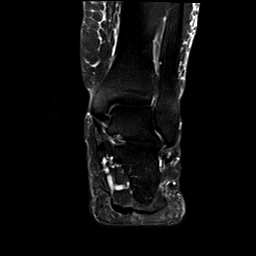
[im 24/32]
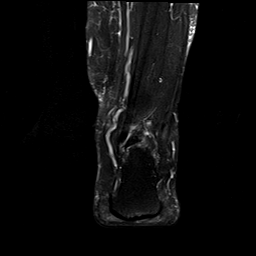
[im 28/32]
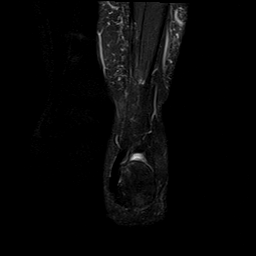
[im 32/32]
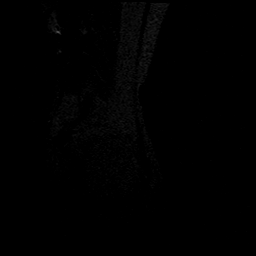

[Series 6: T1 · sagittal · 3.0mm · 0.50mm/px · 7 of 25 slices shown]
[im 1/25]
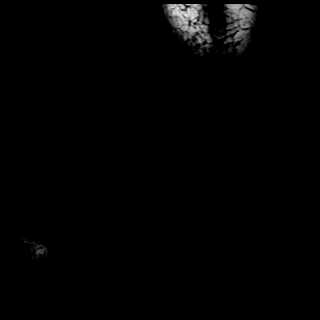
[im 5/25]
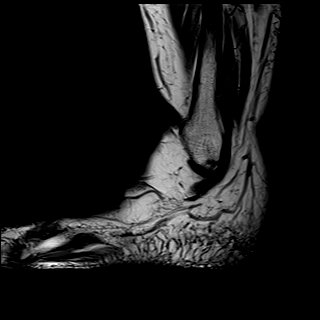
[im 9/25]
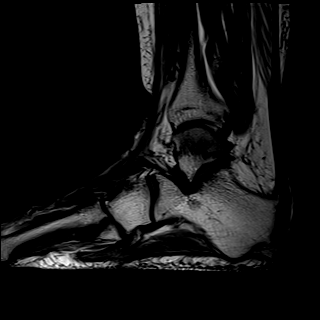
[im 13/25]
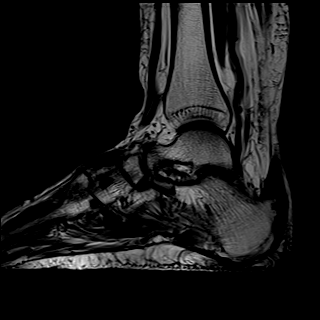
[im 17/25]
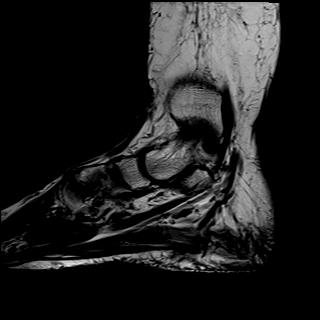
[im 21/25]
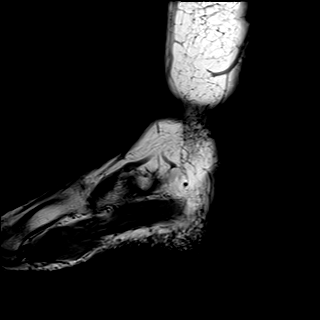
[im 25/25]
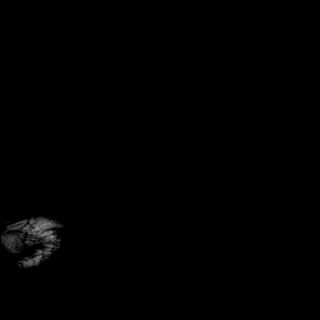

[Series 7: STIR · sagittal · 3.0mm · 0.62mm/px · 7 of 25 slices shown]
[im 1/25]
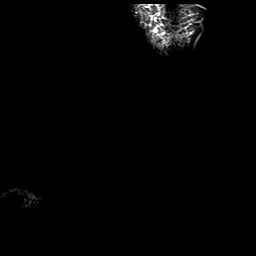
[im 5/25]
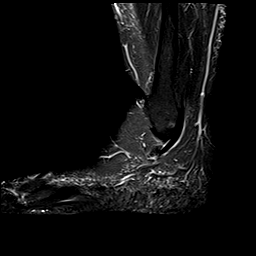
[im 9/25]
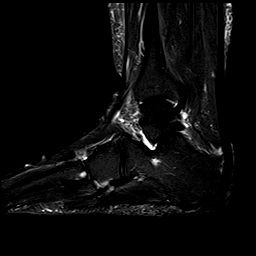
[im 13/25]
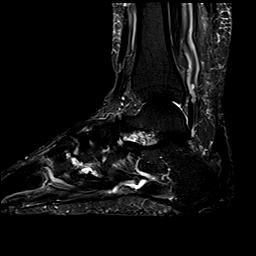
[im 17/25]
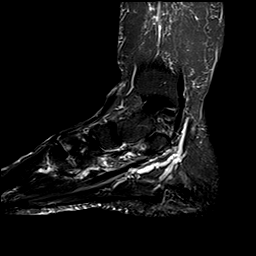
[im 21/25]
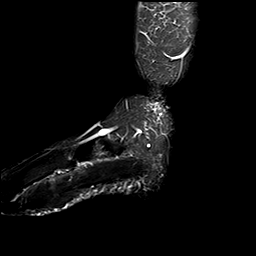
[im 25/25]
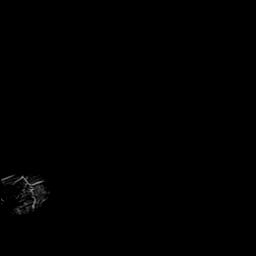

[40 of 40 positions shown; findings below may reference images not displayed]

FINDINGS: TENDONS

Peroneal: Intact peroneus longus and peroneus brevis tendons.

Posteromedial: Intact tibialis posterior, flexor hallucis longus and
flexor digitorum longus tendons.

Anterior: Intact tibialis anterior, extensor hallucis longus and
extensor digitorum longus tendons.

Achilles: Mild tendinosis of the distal Achilles tendon with
low-grade interstitial tear at the distal insertion. No
full-thickness or retracted tear. Small volume retrocalcaneal bursal
fluid.

Plantar Fascia: Intact.

LIGAMENTS

Lateral: The anterior and posterior tibiofibular ligaments are
intact. The anterior and posterior talofibular ligaments are intact.
Intact calcaneofibular ligament.

Medial: Deltoid ligament and spring ligament complex intact.

CARTILAGE

Ankle Joint: No joint effusion or chondral defect.

Subtalar Joints/Sinus Tarsi: No joint effusion or chondral defect.
Preservation of the anatomic fat within the sinus tarsi.

Bones: No acute fracture. No evidence of stress fracture. Haglund
deformity with mild marrow edema in the posterior calcaneal
tuberosity. Small bidirectional calcaneal enthesophytes. Mild
midfoot osteoarthritis. No bone lesion.

Other: No soft tissue edema or fluid collection.
IMPRESSION: 1. No acute osseous abnormality of the left heel. Specifically, no
evidence of calcaneal stress fracture.
2. Mild tendinosis of the distal Achilles tendon with low-grade
interstitial tear at the distal insertion. No full-thickness or
retracted tear.
3. Haglund deformity with mild marrow edema in the posterior
calcaneal tuberosity. Small bidirectional calcaneal enthesophytes.
4. Mild retrocalcaneal bursitis.
5. Mild midfoot osteoarthritis.

## 2021-03-02 ENCOUNTER — Ambulatory Visit: Payer: No Typology Code available for payment source | Admitting: Sports Medicine

## 2021-03-03 ENCOUNTER — Ambulatory Visit: Payer: No Typology Code available for payment source | Admitting: Sports Medicine

## 2021-03-08 ENCOUNTER — Other Ambulatory Visit: Payer: Self-pay | Admitting: Sports Medicine

## 2021-03-08 ENCOUNTER — Other Ambulatory Visit: Payer: Self-pay | Admitting: Medical-Surgical

## 2021-03-08 DIAGNOSIS — E1169 Type 2 diabetes mellitus with other specified complication: Secondary | ICD-10-CM

## 2021-03-08 DIAGNOSIS — M79672 Pain in left foot: Secondary | ICD-10-CM

## 2021-03-09 ENCOUNTER — Ambulatory Visit (INDEPENDENT_AMBULATORY_CARE_PROVIDER_SITE_OTHER): Payer: No Typology Code available for payment source | Admitting: Sports Medicine

## 2021-03-09 ENCOUNTER — Other Ambulatory Visit (HOSPITAL_BASED_OUTPATIENT_CLINIC_OR_DEPARTMENT_OTHER): Payer: Self-pay

## 2021-03-09 ENCOUNTER — Other Ambulatory Visit: Payer: Self-pay

## 2021-03-09 DIAGNOSIS — M79672 Pain in left foot: Secondary | ICD-10-CM | POA: Diagnosis not present

## 2021-03-09 DIAGNOSIS — G8929 Other chronic pain: Secondary | ICD-10-CM | POA: Diagnosis not present

## 2021-03-09 MED ORDER — TRAMADOL HCL 50 MG PO TABS
50.0000 mg | ORAL_TABLET | Freq: Three times a day (TID) | ORAL | 0 refills | Status: DC | PRN
Start: 2021-03-09 — End: 2021-08-15
  Filled 2021-03-09: qty 30, 10d supply, fill #0

## 2021-03-09 MED ORDER — ROSUVASTATIN CALCIUM 40 MG PO TABS
ORAL_TABLET | Freq: Every day | ORAL | 1 refills | Status: DC
  Filled 2021-03-09: qty 90, 90d supply, fill #0
  Filled 2021-07-01: qty 90, 90d supply, fill #1

## 2021-03-09 NOTE — Assessment & Plan Note (Signed)
Jessica [name] returns, she is a pleasant 65 year old female with chronic left heel pain, we treated her with a boot, ultimately obtained an MRI. MRI showed minimal insertional Achilles tearing, spurring, and retrocalcaneal bursitis, she has improved to some degree in the boot, still has pain with uphill walking, today I added a heel lift in the boot, and I added to heel lifts in her shoe as she was feeling the symptoms of a leg length discrepancy. Will continue the above for an additional month and if insufficient improvement we will consider retrocalcaneal bursa injection followed by 1 week of additional boot immobilization.Marland Kitchen

## 2021-03-09 NOTE — Progress Notes (Signed)
° ° °  Procedures performed today:    None.  Independent interpretation of notes and tests performed by another provider:   None.  Brief History, Exam, Impression, and Recommendations:    Chronic heel pain, left Jessica [name] returns, she is a pleasant 65 year old female with chronic left heel pain, we treated her with a boot, ultimately obtained an MRI. MRI showed minimal insertional Achilles tearing, spurring, and retrocalcaneal bursitis, she has improved to some degree in the boot, still has pain with uphill walking, today I added a heel lift in the boot, and I added to heel lifts in her shoe as she was feeling the symptoms of a leg length discrepancy. Will continue the above for an additional month and if insufficient improvement we will consider retrocalcaneal bursa injection followed by 1 week of additional boot immobilization..    ___________________________________________ Ihor Austin. Benjamin Stain, M.D., ABFM., CAQSM. Primary Care and Sports Medicine [facility]  Adjunct Instructor of Family Medicine  [employer]

## 2021-04-05 ENCOUNTER — Other Ambulatory Visit: Payer: Self-pay

## 2021-04-05 ENCOUNTER — Other Ambulatory Visit (HOSPITAL_BASED_OUTPATIENT_CLINIC_OR_DEPARTMENT_OTHER): Payer: Self-pay

## 2021-04-05 ENCOUNTER — Encounter: Payer: Self-pay | Admitting: Medical-Surgical

## 2021-04-05 ENCOUNTER — Ambulatory Visit (INDEPENDENT_AMBULATORY_CARE_PROVIDER_SITE_OTHER): Payer: No Typology Code available for payment source | Admitting: Medical-Surgical

## 2021-04-05 VITALS — BP 138/81 | HR 65 | Resp 20 | Ht <= 58 in | Wt 210.0 lb

## 2021-04-05 DIAGNOSIS — R809 Proteinuria, unspecified: Secondary | ICD-10-CM | POA: Diagnosis not present

## 2021-04-05 DIAGNOSIS — F330 Major depressive disorder, recurrent, mild: Secondary | ICD-10-CM

## 2021-04-05 DIAGNOSIS — Z794 Long term (current) use of insulin: Secondary | ICD-10-CM | POA: Diagnosis not present

## 2021-04-05 DIAGNOSIS — E1159 Type 2 diabetes mellitus with other circulatory complications: Secondary | ICD-10-CM | POA: Diagnosis not present

## 2021-04-05 DIAGNOSIS — I152 Hypertension secondary to endocrine disorders: Secondary | ICD-10-CM

## 2021-04-05 DIAGNOSIS — E1129 Type 2 diabetes mellitus with other diabetic kidney complication: Secondary | ICD-10-CM | POA: Diagnosis not present

## 2021-04-05 DIAGNOSIS — Z8639 Personal history of other endocrine, nutritional and metabolic disease: Secondary | ICD-10-CM

## 2021-04-05 DIAGNOSIS — E785 Hyperlipidemia, unspecified: Secondary | ICD-10-CM

## 2021-04-05 DIAGNOSIS — E1169 Type 2 diabetes mellitus with other specified complication: Secondary | ICD-10-CM

## 2021-04-05 DIAGNOSIS — F419 Anxiety disorder, unspecified: Secondary | ICD-10-CM

## 2021-04-05 DIAGNOSIS — Z862 Personal history of diseases of the blood and blood-forming organs and certain disorders involving the immune mechanism: Secondary | ICD-10-CM

## 2021-04-05 DIAGNOSIS — Z23 Encounter for immunization: Secondary | ICD-10-CM

## 2021-04-05 LAB — CBC WITH DIFFERENTIAL/PLATELET
Absolute Monocytes: 593 cells/uL (ref 200–950)
Basophils Absolute: 46 cells/uL (ref 0–200)
Basophils Relative: 0.4 %
Eosinophils Absolute: 764 cells/uL — ABNORMAL HIGH (ref 15–500)
Eosinophils Relative: 6.7 %
HCT: 38.5 % (ref 35.0–45.0)
Hemoglobin: 12.7 g/dL (ref 11.7–15.5)
Lymphs Abs: 1733 cells/uL (ref 850–3900)
MCH: 26.3 pg — ABNORMAL LOW (ref 27.0–33.0)
MCHC: 33 g/dL (ref 32.0–36.0)
MCV: 79.7 fL — ABNORMAL LOW (ref 80.0–100.0)
MPV: 9.2 fL (ref 7.5–12.5)
Monocytes Relative: 5.2 %
Neutro Abs: 8265 cells/uL — ABNORMAL HIGH (ref 1500–7800)
Neutrophils Relative %: 72.5 %
Platelets: 339 10*3/uL (ref 140–400)
RBC: 4.83 10*6/uL (ref 3.80–5.10)
RDW: 13.3 % (ref 11.0–15.0)
Total Lymphocyte: 15.2 %
WBC: 11.4 10*3/uL — ABNORMAL HIGH (ref 3.8–10.8)

## 2021-04-05 LAB — LIPID PANEL
Cholesterol: 117 mg/dL (ref <200)
HDL: 46 mg/dL — ABNORMAL LOW (ref >=50)
LDL Cholesterol (Calc): 48 mg/dL (calc)
Non-HDL Cholesterol (Calc): 71 mg/dL (calc) (ref <130)
Total CHOL/HDL Ratio: 2.5 (calc) (ref <5.0)
Triglycerides: 156 mg/dL — ABNORMAL HIGH (ref <150)

## 2021-04-05 LAB — COMPLETE METABOLIC PANEL WITH GFR
AG Ratio: 1.5 (calc) (ref 1.0–2.5)
ALT: 15 U/L (ref 6–29)
AST: 15 U/L (ref 10–35)
Albumin: 4.3 g/dL (ref 3.6–5.1)
Alkaline phosphatase (APISO): 68 U/L (ref 37–153)
BUN: 13 mg/dL (ref 7–25)
CO2: 35 mmol/L — ABNORMAL HIGH (ref 20–32)
Calcium: 9.7 mg/dL (ref 8.6–10.4)
Chloride: 96 mmol/L — ABNORMAL LOW (ref 98–110)
Creat: 0.73 mg/dL (ref 0.50–1.05)
Globulin: 2.8 g/dL (calc) (ref 1.9–3.7)
Glucose, Bld: 93 mg/dL (ref 65–99)
Potassium: 3.5 mmol/L (ref 3.5–5.3)
Sodium: 136 mmol/L (ref 135–146)
Total Bilirubin: 0.7 mg/dL (ref 0.2–1.2)
Total Protein: 7.1 g/dL (ref 6.1–8.1)
eGFR: 92 mL/min/{1.73_m2} (ref >=60)

## 2021-04-05 LAB — IRON,TIBC AND FERRITIN PANEL
%SAT: 10 % (calc) — ABNORMAL LOW (ref 16–45)
Ferritin: 19 ng/mL (ref 16–288)
Iron: 41 ug/dL — ABNORMAL LOW (ref 45–160)
TIBC: 414 mcg/dL (calc) (ref 250–450)

## 2021-04-05 LAB — VITAMIN D 25 HYDROXY (VIT D DEFICIENCY, FRACTURES): Vit D, 25-Hydroxy: 46 ng/mL (ref 30–100)

## 2021-04-05 LAB — POCT GLYCOSYLATED HEMOGLOBIN (HGB A1C): HbA1c, POC (controlled diabetic range): 6.6 % (ref 0.0–7.0)

## 2021-04-05 MED ORDER — LISINOPRIL-HYDROCHLOROTHIAZIDE 10-12.5 MG PO TABS
1.0000 | ORAL_TABLET | Freq: Every day | ORAL | 0 refills | Status: DC
Start: 2021-04-05 — End: 2021-07-01
  Filled 2021-04-05: qty 90, 90d supply, fill #0

## 2021-04-05 MED ORDER — AMLODIPINE BESYLATE 5 MG PO TABS
5.0000 mg | ORAL_TABLET | Freq: Every day | ORAL | 1 refills | Status: DC
  Filled 2021-04-05: qty 90, 90d supply, fill #0
  Filled 2021-05-12 – 2021-09-03 (×2): qty 90, 90d supply, fill #1

## 2021-04-05 NOTE — Progress Notes (Signed)
Established patient office visit  HPI with pertinent ROS:   CC: Chronic disease follow-up  HPI: Pleasant 66 year old female presenting today for the following:  Hypertension- taking amlodipine 5mg  daily and quinipril-HCTZ 20-12.5mg  daily as prescribed, tolerating well without side effects. Notes that she took the last dose of her quiniril-HCTZz this morning and has been unable to get a refill because the medication has been discontinued. Needs a substitute for the medication. Denies CP, SOB, palpitations, lower extremity edema, dizziness, headaches, or vision changes.  Hyperlipidemia- taking Crestor 40mg  daily as prescribed, tolerating well without side effects. Following a low fat diet.   Diabetes- checking sugars regularly. This morning was 112. Does have some periods where she feels like her sugar is low and is aware to eat a small snack rather than a bunch of sugary foods. Has had a decreased appetite lately. Taking Metformin 1000mg  twice daily. Taking Semaglutide 0.5mg  weekly, tolerating well.   Mood- Doing ok on Zoloft 100mg  daily. Feels mood is stable but does feel tired all the time. Denies SI/HI.   Vitamin D deficiency- taking a daily vitamin D supplement most days.   Iron deficiency- Taking an OTC iron supplement twice daily. Reports feeling cold all the time.   I reviewed the past medical history, family history, social history, surgical history, and allergies today and no changes were needed.  Please see the problem list section below in epic for further details.   Brief exam, Assessment, and Plan:   Today's Vitals: BP 138/81 (BP Location: Left Arm, Patient Position: Sitting, Cuff Size: Normal)    Pulse 65    Resp 20    Ht 4\' 10"  (1.473 m)    Wt 210 lb (95.3 kg)    SpO2 99%    BMI 43.89 kg/m   1. Hypertension associated with diabetes ([place]) BP at goal today. On exam, heart rate regular, normal rhythm and heart sounds. No peripheral edema. Lung sounds CTA. Continue  Amlodipine 5mg  daily. Switching to Lisinopril-HCTZ 10-12.5mg  daily. Checking labs. Continue to monitor BP at home with a goal of 130/80 or less. Will plan to have her BP checked when she returns for her appointment with Dr. Darene Lamer.  - amLODipine (NORVASC) 5 MG tablet; Take 1 tablet (5 mg total) by mouth daily.  Dispense: 90 tablet; Refill: 1 - CBC with Differential - COMPLETE METABOLIC PANEL WITH GFR - Lipid panel  2. Need for shingles vaccine Shingrix #2 given in office today.  - Varicella-zoster vaccine IM (Shingrix)  3. Hyperlipidemia associated with type 2 diabetes mellitus (HCC) Checking lipid panel. Continue Crestor 40mg  daily.  - Lipid panel  4. Type 2 diabetes mellitus with microalbuminuria, with long-term current use of insulin (HCC) POCT A1c 6.6% indicating good control of diabetes. Foot exam completed. Continue Metformin 1000mg  twice daily and Semaglutide 0.5mg  weekly.  - POCT HgB A1C - HM Diabetes Foot Exam  5. Anxiety 6. Mild episode of recurrent major depressive disorder (HCC) Stable with normal mood, thought patterns, and speech patterns. Continue zoloft 100mg  daily.   7. History of vitamin D deficiency Checking vitamin D.  - VITAMIN D 25 Hydroxy (Vit-D Deficiency, Fractures)  8. History of iron deficiency anemia Checking iron panel.  - Fe+TIBC+Fer  9. Need for Tdap vaccination Tdap given in office today.  - Tdap vaccine greater than or equal to 7yo IM  Return in about 6 months (around 10/03/2021) for DM/HTN/HLD follow up. ___________________________________________ Clearnce Sorrel, DNP, APRN, FNP-BC Primary Care and [facility]

## 2021-04-06 ENCOUNTER — Other Ambulatory Visit: Payer: Self-pay

## 2021-04-06 DIAGNOSIS — D509 Iron deficiency anemia, unspecified: Secondary | ICD-10-CM

## 2021-04-06 NOTE — Progress Notes (Signed)
Orders only.  T. Jiali Linney, CMA 

## 2021-04-07 ENCOUNTER — Other Ambulatory Visit: Payer: Self-pay

## 2021-04-07 ENCOUNTER — Ambulatory Visit (INDEPENDENT_AMBULATORY_CARE_PROVIDER_SITE_OTHER): Payer: No Typology Code available for payment source

## 2021-04-07 ENCOUNTER — Ambulatory Visit (INDEPENDENT_AMBULATORY_CARE_PROVIDER_SITE_OTHER): Payer: No Typology Code available for payment source | Admitting: Sports Medicine

## 2021-04-07 DIAGNOSIS — G8929 Other chronic pain: Secondary | ICD-10-CM

## 2021-04-07 DIAGNOSIS — M79672 Pain in left foot: Secondary | ICD-10-CM

## 2021-04-07 NOTE — Assessment & Plan Note (Signed)
Jessica [name] returns, pleasant 66 year old female, chronic left heel pain, she has been in a boot, ultimately we obtained an MRI that showed minimal insertional Achilles tearing, spurring and retrocalcaneal bursitis, unfortunately continues to have pain with uphill walking in spite of heel lifts, boot, analgesics. Today we did a retrocalcaneal bursa injection with ultrasound guidance followed by 1 week of boot immobilization to protect the Achilles, return to see me in 1 month.

## 2021-04-07 NOTE — Progress Notes (Signed)
° ° °  Procedures performed today:    Procedure: Real-time Ultrasound Guided injection of the left retrocalcaneal bursa Device: Samsung HS60  Verbal informed consent obtained.  Time-out conducted.  Noted no overlying erythema, induration, or other signs of local infection.  Skin prepped in a sterile fashion.  Local anesthesia: Topical Ethyl chloride.  With sterile technique and under real time ultrasound guidance: Noted minimally distended retrocalcaneal bursa, taking care to avoid intra-achilles injection I placed 1 cc kenalog 40, 1 cc lidocaine in the bursa. Completed without difficulty  Advised to call if fevers/chills, erythema, induration, drainage, or persistent bleeding.  Images permanently stored and available for review in PACS.  Impression: Technically successful ultrasound guided injection.  Independent interpretation of notes and tests performed by another provider:   None.  Brief History, Exam, Impression, and Recommendations:    Chronic heel pain, left Jessica [name] returns, pleasant 66 year old female, chronic left heel pain, she has been in a boot, ultimately we obtained an MRI that showed minimal insertional Achilles tearing, spurring and retrocalcaneal bursitis, unfortunately continues to have pain with uphill walking in spite of heel lifts, boot, analgesics. Today we did a retrocalcaneal bursa injection with ultrasound guidance followed by 1 week of boot immobilization to protect the Achilles, return to see me in 1 month.  Chronic process with exacerbation and pharmacologic intervention  ___________________________________________ Ihor Austin. Benjamin Stain, M.D., ABFM., CAQSM. Primary Care and Sports Medicine [facility]  Adjunct Instructor of Family Medicine  [employer]

## 2021-04-09 ENCOUNTER — Other Ambulatory Visit: Payer: Self-pay | Admitting: Family

## 2021-04-09 DIAGNOSIS — D649 Anemia, unspecified: Secondary | ICD-10-CM

## 2021-04-12 ENCOUNTER — Inpatient Hospital Stay (HOSPITAL_BASED_OUTPATIENT_CLINIC_OR_DEPARTMENT_OTHER): Payer: No Typology Code available for payment source | Admitting: Family

## 2021-04-12 ENCOUNTER — Other Ambulatory Visit: Payer: Self-pay

## 2021-04-12 ENCOUNTER — Inpatient Hospital Stay: Payer: No Typology Code available for payment source | Attending: Internal Medicine

## 2021-04-12 ENCOUNTER — Telehealth: Payer: Self-pay | Admitting: *Deleted

## 2021-04-12 ENCOUNTER — Encounter: Payer: Self-pay | Admitting: Family

## 2021-04-12 VITALS — BP 122/69 | HR 70 | Temp 98.7°F | Resp 18 | Wt 206.1 lb

## 2021-04-12 DIAGNOSIS — D509 Iron deficiency anemia, unspecified: Secondary | ICD-10-CM | POA: Diagnosis not present

## 2021-04-12 DIAGNOSIS — R002 Palpitations: Secondary | ICD-10-CM | POA: Diagnosis not present

## 2021-04-12 DIAGNOSIS — M1711 Unilateral primary osteoarthritis, right knee: Secondary | ICD-10-CM | POA: Diagnosis not present

## 2021-04-12 DIAGNOSIS — D649 Anemia, unspecified: Secondary | ICD-10-CM

## 2021-04-12 DIAGNOSIS — R5383 Other fatigue: Secondary | ICD-10-CM | POA: Insufficient documentation

## 2021-04-12 DIAGNOSIS — Z7984 Long term (current) use of oral hypoglycemic drugs: Secondary | ICD-10-CM | POA: Insufficient documentation

## 2021-04-12 DIAGNOSIS — E1140 Type 2 diabetes mellitus with diabetic neuropathy, unspecified: Secondary | ICD-10-CM | POA: Diagnosis not present

## 2021-04-12 LAB — CBC WITH DIFFERENTIAL (CANCER CENTER ONLY)
Abs Immature Granulocytes: 0.09 10*3/uL — ABNORMAL HIGH (ref 0.00–0.07)
Basophils Absolute: 0.1 10*3/uL (ref 0.0–0.1)
Basophils Relative: 1 %
Eosinophils Absolute: 0.5 10*3/uL (ref 0.0–0.5)
Eosinophils Relative: 5 %
HCT: 39.4 % (ref 36.0–46.0)
Hemoglobin: 12.9 g/dL (ref 12.0–15.0)
Immature Granulocytes: 1 %
Lymphocytes Relative: 19 %
Lymphs Abs: 1.9 10*3/uL (ref 0.7–4.0)
MCH: 26.1 pg (ref 26.0–34.0)
MCHC: 32.7 g/dL (ref 30.0–36.0)
MCV: 79.6 fL — ABNORMAL LOW (ref 80.0–100.0)
Monocytes Absolute: 0.5 10*3/uL (ref 0.1–1.0)
Monocytes Relative: 5 %
Neutro Abs: 6.9 10*3/uL (ref 1.7–7.7)
Neutrophils Relative %: 69 %
Platelet Count: 355 10*3/uL (ref 150–400)
RBC: 4.95 MIL/uL (ref 3.87–5.11)
RDW: 14.1 % (ref 11.5–15.5)
WBC Count: 10.1 10*3/uL (ref 4.0–10.5)
nRBC: 0 % (ref 0.0–0.2)

## 2021-04-12 LAB — CMP (CANCER CENTER ONLY)
ALT: 14 U/L (ref 0–44)
AST: 16 U/L (ref 15–41)
Albumin: 4.3 g/dL (ref 3.5–5.0)
Alkaline Phosphatase: 82 U/L (ref 38–126)
Anion gap: 8 (ref 5–15)
BUN: 12 mg/dL (ref 8–23)
CO2: 30 mmol/L (ref 22–32)
Calcium: 9.9 mg/dL (ref 8.9–10.3)
Chloride: 97 mmol/L — ABNORMAL LOW (ref 98–111)
Creatinine: 0.75 mg/dL (ref 0.44–1.00)
GFR, Estimated: >60 mL/min (ref >60)
Glucose, Bld: 94 mg/dL (ref 70–99)
Potassium: 3.5 mmol/L (ref 3.5–5.1)
Sodium: 135 mmol/L (ref 135–145)
Total Bilirubin: 0.5 mg/dL (ref 0.3–1.2)
Total Protein: 7.4 g/dL (ref 6.5–8.1)

## 2021-04-12 LAB — SAVE SMEAR(SSMR), FOR PROVIDER SLIDE REVIEW

## 2021-04-12 LAB — IRON AND IRON BINDING CAPACITY (CC-WL,HP ONLY)
Iron: 48 ug/dL (ref 28–170)
Saturation Ratios: 10 % — ABNORMAL LOW (ref 10.4–31.8)
TIBC: 489 ug/dL — ABNORMAL HIGH (ref 250–450)
UIBC: 441 ug/dL (ref 148–442)

## 2021-04-12 LAB — RETICULOCYTES
Immature Retic Fract: 13.6 % (ref 2.3–15.9)
RBC.: 4.94 MIL/uL (ref 3.87–5.11)
Retic Count, Absolute: 88.9 10*3/uL (ref 19.0–186.0)
Retic Ct Pct: 1.8 % (ref 0.4–3.1)

## 2021-04-12 LAB — FERRITIN: Ferritin: 25 ng/mL (ref 11–307)

## 2021-04-12 NOTE — Telephone Encounter (Signed)
Per 04/12/21 los - called and lvm of upcoming appointments - requested callback to confirm. 

## 2021-04-12 NOTE — Progress Notes (Signed)
Hematology/Oncology Consultation   Name: Jessica [name] [facility]      MRN: [id number]    Location: Room/bed info not found  Date: 04/12/2021 Time:8:42 AM   REFERRING PHYSICIAN: Christen Butter, NP  REASON FOR CONSULT: Iron deficiency anemia   DIAGNOSIS: Iron deficiency anemia  HISTORY OF PRESENT ILLNESS: Ms. Jessica [name] is a very pleasant 66 yo caucasian female with long history of iron deficiency anemia. She states that she and her daughters do not absorb iron well from their diet. She has 1 daughter on an oral iron supplement and states that her niece has also needed IV iron in the past.  She is taking an oral iron supplement twice daily at this time.  She has not noted any blood loss. No abnormal bruising or petechiae.  She is symptomatic with fatigue and also had 1 episode of palpitations last night.  She states that her last colonoscopy was over 20 years ago and she really does not want to have another. She states that her PCP has ordered the Cologuard test for her.   Mammogram in July 2022 was negative.  She has 2 daughters. One of her daughter's had a twin that she lost during the first trimester.  Her female organs are still in place.  She is diabetic and is on [place], Ozempic and Metformin.  No history of thyroid disease.  No fever, chills, n/v, cough, rash, dizziness, SOB, chest pain, abdominal pain or changes in bowel or bladder habits.  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  She has a achilles tendonitis and bursitis in the left foot. She is wearing a support boot.  She has a right torn meniscus and arthritis in the right knee. She states that she gets Hyaluronan injections regularly.  No falls or syncope to report.  Her appetite is down but she is trying her best to eat healthy. She does her best to stay well hydrated but states that she limits her fluid intake at night because she can not leave her desk to go to the restroom.  No smoking, ETOH or recreational drug use.   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   ROS: All other 10 point review of systems is negative.   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 90 tablet 1   aspirin 81 MG tablet Take 81 mg by mouth at bedtime.     ferrous sulfate 325 (65 FE) MG tablet Take 1 tablet (325 mg total) by mouth 2 (two) times daily with a meal. 180 tablet 3   ibuprofen (ADVIL) 800 MG tablet TAKE 1 TABLET (800 MG TOTAL)  BY MOUTH EVERY 8 (EIGHT) HOURS AS NEEDED. 60 tablet 2   insulin degludec (TRESIBA) 200 UNIT/ML FlexTouch Pen INJECT 50-80 UNITS INTO THE SKIN AT BEDTIME. INCREASE BY 2 UNITS EVERY 3 DAYS UNTIL FBS CONSISTENTLY 90-130 THEN STAY AT THAT DOSE. 15 mL 11   lisinopril-hydrochlorothiazide (ZESTORETIC) 10-12.5 MG tablet Take 1 tablet by mouth daily. 90 tablet 0   Melatonin 5 MG CHEW Chew 5 mg by mouth 3 times/day as needed-between meals & bedtime.      metFORMIN (GLUCOPHAGE) 1000 MG tablet TAKE 1 TABLET (1,000 MG TOTAL) BY MOUTH 2 (TWO) TIMES DAILY WITH A MEAL. 180 tablet 3   rosuvastatin (CRESTOR) 40 MG tablet TAKE 1 TABLET (40 MG TOTAL) BY MOUTH AT BEDTIME. 90 tablet 1   Semaglutide,0.25 or 0.5MG /DOS, (OZEMPIC, 0.25 OR 0.5 MG/DOSE,) 2 MG/1.5ML SOPN Inject 0.5 mg into the skin once a week. 6 mL 4    sertraline (ZOLOFT) 100 MG tablet Take 1 tablet (100 mg total) by mouth daily. 90 tablet 1   Specialty Vitamins Products (MAGNESIUM, AMINO ACID CHELATE,) 133 MG tablet Take by mouth.     traMADol (ULTRAM) 50 MG tablet Take 1 tablet (50 mg total) by mouth every 8 (eight) hours as needed for moderate pain. Maximum of 6 tablets per day. 30 tablet 0   No current facility-administered medications on file prior to visit.     PAST SURGICAL HISTORY Past Surgical History:  Procedure Laterality Date   APPENDECTOMY     CARDIAC CATHETERIZATION     Negative. Dx was Anxiety   HERNIA REPAIR     LEG SURGERY     TUBAL LIGATION      FAMILY HISTORY: Family History  Problem Relation Age of Onset   Hyperlipidemia Mother    Hypertension Mother    Stroke Father    Thyroid cancer Sister    Diabetes Maternal Aunt    Skin cancer Maternal Uncle     SOCIAL HISTORY:  reports that she has never smoked. She has never used smokeless tobacco. She reports that she does not drink alcohol and does not use drugs.  PERFORMANCE STATUS: The patient's performance status is 1 - Symptomatic but completely ambulatory  PHYSICAL EXAM: Most Recent Vital Signs: There were no vitals taken for this visit. There were no vitals taken for this visit.  General Appearance:    Alert, cooperative, no distress, appears stated age  Head:    Normocephalic, without obvious abnormality, atraumatic  Eyes:    PERRL, conjunctiva/corneas clear, EOM's intact, fundi    benign, both eyes        Throat:   Lips, mucosa, and tongue normal; teeth and gums normal  Neck:   Supple, symmetrical, trachea midline, no adenopathy;    thyroid:  no enlargement/tenderness/nodules; no carotid   bruit or JVD  Back:     Symmetric, no curvature, ROM normal, no CVA tenderness  Lungs:     Clear to auscultation bilaterally, respirations unlabored  Chest Wall:    No tenderness or deformity   Heart:    Regular rate and rhythm, S1 and S2 normal, no murmur,  rub   or gallop     Abdomen:     Soft, non-tender, bowel sounds active all four quadrants,    no masses, no organomegaly        Extremities:   Extremities normal, atraumatic, no cyanosis or edema  Pulses:   2+ and symmetric all extremities  Skin:   Skin color, texture, turgor normal, no  rashes or lesions  Lymph nodes:   Cervical, supraclavicular, and axillary nodes normal  Neurologic:   CNII-XII intact, normal strength, sensation and reflexes    throughout    LABORATORY DATA:  Results for orders placed or performed in visit on 04/12/21 (from the past 48 hour(s))  CBC with Differential (Cancer Center Only)     Status: Abnormal   Collection Time: 04/12/21  8:02 AM  Result Value Ref Range   WBC Count 10.1 4.0 - 10.5 K/uL   RBC 4.95 3.87 - 5.11 MIL/uL   Hemoglobin 12.9 12.0 - 15.0 g/dL   HCT 19.139.4 47.836.0 - 29.546.0 %   MCV 79.6 (L) 80.0 - 100.0 fL   MCH 26.1 26.0 - 34.0 pg   MCHC 32.7 30.0 - 36.0 g/dL   RDW 62.114.1 30.811.5 - 65.715.5 %   Platelet Count 355 150 - 400 K/uL   nRBC 0.0 0.0 - 0.2 %   Neutrophils Relative % 69 %   Neutro Abs 6.9 1.7 - 7.7 K/uL   Lymphocytes Relative 19 %   Lymphs Abs 1.9 0.7 - 4.0 K/uL   Monocytes Relative 5 %   Monocytes Absolute 0.5 0.1 - 1.0 K/uL   Eosinophils Relative 5 %   Eosinophils Absolute 0.5 0.0 - 0.5 K/uL   Basophils Relative 1 %   Basophils Absolute 0.1 0.0 - 0.1 K/uL   Immature Granulocytes 1 %   Abs Immature Granulocytes 0.09 (H) 0.00 - 0.07 K/uL    Comment: Performed at [facility] at [facility] [place], [address], [place], [place] [address]  CMP (Cancer Center only)     Status: Abnormal   Collection Time: 04/12/21  8:02 AM  Result Value Ref Range   Sodium 135 135 - 145 mmol/L   Potassium 3.5 3.5 - 5.1 mmol/L   Chloride 97 (L) 98 - 111 mmol/L   CO2 30 22 - 32 mmol/L   Glucose, Bld 94 70 - 99 mg/dL    Comment: Glucose reference range applies only to samples taken after fasting for at least 8 hours.   BUN 12 8 - 23  mg/dL   Creatinine 2.950.75 2.840.44 - 1.00 mg/dL   Calcium 9.9 8.9 - 13.210.3 mg/dL   Total Protein 7.4 6.5 - 8.1 g/dL   Albumin 4.3 3.5 - 5.0 g/dL   AST 16 15 - 41 U/L   ALT 14 0 - 44 U/L   Alkaline Phosphatase 82 38 - 126 U/L   Total Bilirubin 0.5 0.3 - 1.2 mg/dL   GFR, Estimated >44>60 >01>60 mL/min    Comment: (NOTE) Calculated using the CKD-EPI Creatinine Equation (2021)    Anion gap 8 5 - 15    Comment: Performed at [facility] at [facility] [place], [address], [place], [place] [address]  Save Smear [facility])     Status: None   Collection Time: 04/12/21  8:02 AM  Result Value Ref Range   Smear Review SMEAR STAINED AND AVAILABLE FOR REVIEW     Comment: Performed at [facility] at [facility] [place], [address], [place], [place] [address]  Reticulocytes     Status: None   Collection Time: 04/12/21  8:03 AM  Result Value Ref Range   Retic Ct Pct 1.8 0.4 - 3.1 %   RBC. 4.94 3.87 - 5.11 MIL/uL   Retic Count, Absolute 88.9 19.0 - 186.0 K/uL   Immature Retic Fract 13.6 2.3 -  15.9 %    Comment: Performed at [facility] at [facility], [address] [place], [place] [address]      RADIOGRAPHY: No results found.     PATHOLOGY: None  ASSESSMENT/PLAN: Ms. Jessica [name] is a very pleasant 66 yo caucasian female with long history of iron deficiency anemia.  Anemia work up is pending. She is symptomatic at this time with fatigue.  If iron studies are still low she will stop her iron iron supplement and we will replace if IV iron.  Follow-up in 8 weeks.   All questions were answered. The patient knows to call the clinic with any problems, questions or concerns. We can certainly see the patient much sooner if necessary.   Eileen Stanford, NP

## 2021-04-13 ENCOUNTER — Other Ambulatory Visit: Payer: Self-pay | Admitting: Family

## 2021-04-13 DIAGNOSIS — D509 Iron deficiency anemia, unspecified: Secondary | ICD-10-CM

## 2021-04-13 LAB — ERYTHROPOIETIN: Erythropoietin: 12 m[IU]/mL (ref 2.6–18.5)

## 2021-04-15 ENCOUNTER — Telehealth: Payer: Self-pay | Admitting: *Deleted

## 2021-04-15 ENCOUNTER — Ambulatory Visit: Payer: No Typology Code available for payment source

## 2021-04-15 NOTE — Telephone Encounter (Signed)
Per scheduling message Maralyn Sago - called and gave upcoming appointments for (3) doses of IV Iron - confirmed

## 2021-04-22 ENCOUNTER — Other Ambulatory Visit: Payer: Self-pay | Admitting: Medical-Surgical

## 2021-04-22 DIAGNOSIS — E1129 Type 2 diabetes mellitus with other diabetic kidney complication: Secondary | ICD-10-CM

## 2021-04-22 DIAGNOSIS — R809 Proteinuria, unspecified: Secondary | ICD-10-CM

## 2021-04-23 ENCOUNTER — Inpatient Hospital Stay: Payer: No Typology Code available for payment source | Attending: Internal Medicine

## 2021-04-23 ENCOUNTER — Other Ambulatory Visit: Payer: Self-pay

## 2021-04-23 ENCOUNTER — Other Ambulatory Visit (HOSPITAL_BASED_OUTPATIENT_CLINIC_OR_DEPARTMENT_OTHER): Payer: Self-pay

## 2021-04-23 VITALS — BP 120/55 | HR 63 | Temp 97.8°F | Resp 18

## 2021-04-23 DIAGNOSIS — E1140 Type 2 diabetes mellitus with diabetic neuropathy, unspecified: Secondary | ICD-10-CM | POA: Insufficient documentation

## 2021-04-23 DIAGNOSIS — M1711 Unilateral primary osteoarthritis, right knee: Secondary | ICD-10-CM | POA: Diagnosis not present

## 2021-04-23 DIAGNOSIS — D509 Iron deficiency anemia, unspecified: Secondary | ICD-10-CM | POA: Diagnosis present

## 2021-04-23 DIAGNOSIS — Z7984 Long term (current) use of oral hypoglycemic drugs: Secondary | ICD-10-CM | POA: Diagnosis not present

## 2021-04-23 DIAGNOSIS — R002 Palpitations: Secondary | ICD-10-CM | POA: Diagnosis not present

## 2021-04-23 DIAGNOSIS — R5383 Other fatigue: Secondary | ICD-10-CM | POA: Diagnosis not present

## 2021-04-23 MED ORDER — SODIUM CHLORIDE 0.9 % IV SOLN: Freq: Once | INTRAVENOUS | Status: AC

## 2021-04-23 MED ORDER — SODIUM CHLORIDE 0.9 % IV SOLN
300.0000 mg | Freq: Once | INTRAVENOUS | Status: AC
  Administered 2021-04-23: 300 mg via INTRAVENOUS
  Filled 2021-04-23: qty 10

## 2021-04-23 MED ORDER — METFORMIN HCL 1000 MG PO TABS
1000.0000 mg | ORAL_TABLET | Freq: Two times a day (BID) | ORAL | 3 refills | Status: DC
  Filled 2021-04-30: qty 180, 90d supply, fill #0
  Filled 2021-08-10: qty 180, 90d supply, fill #1
  Filled 2021-11-27: qty 180, 90d supply, fill #2
  Filled 2022-02-28: qty 180, 90d supply, fill #3

## 2021-04-23 NOTE — Patient Instructions (Signed)

## 2021-04-27 ENCOUNTER — Ambulatory Visit: Payer: No Typology Code available for payment source | Admitting: Medical-Surgical

## 2021-04-30 ENCOUNTER — Other Ambulatory Visit (HOSPITAL_BASED_OUTPATIENT_CLINIC_OR_DEPARTMENT_OTHER): Payer: Self-pay

## 2021-04-30 ENCOUNTER — Other Ambulatory Visit: Payer: Self-pay

## 2021-04-30 ENCOUNTER — Inpatient Hospital Stay: Payer: No Typology Code available for payment source

## 2021-04-30 VITALS — BP 128/56 | HR 65 | Temp 98.0°F | Resp 17

## 2021-04-30 DIAGNOSIS — D509 Iron deficiency anemia, unspecified: Secondary | ICD-10-CM | POA: Diagnosis not present

## 2021-04-30 MED ORDER — SODIUM CHLORIDE 0.9 % IV SOLN
300.0000 mg | Freq: Once | INTRAVENOUS | Status: AC
  Administered 2021-04-30: 300 mg via INTRAVENOUS
  Filled 2021-04-30: qty 300

## 2021-04-30 MED ORDER — SODIUM CHLORIDE 0.9 % IV SOLN: Freq: Once | INTRAVENOUS | Status: AC

## 2021-04-30 NOTE — Patient Instructions (Signed)

## 2021-05-07 ENCOUNTER — Inpatient Hospital Stay: Payer: No Typology Code available for payment source

## 2021-05-07 ENCOUNTER — Other Ambulatory Visit: Payer: Self-pay

## 2021-05-07 VITALS — BP 112/48 | HR 68 | Temp 98.0°F | Resp 16

## 2021-05-07 DIAGNOSIS — D509 Iron deficiency anemia, unspecified: Secondary | ICD-10-CM | POA: Diagnosis not present

## 2021-05-07 MED ORDER — SODIUM CHLORIDE 0.9 % IV SOLN: Freq: Once | INTRAVENOUS | Status: AC

## 2021-05-07 MED ORDER — SODIUM CHLORIDE 0.9 % IV SOLN
300.0000 mg | Freq: Once | INTRAVENOUS | Status: AC
  Administered 2021-05-07: 300 mg via INTRAVENOUS
  Filled 2021-05-07: qty 300

## 2021-05-07 NOTE — Patient Instructions (Signed)

## 2021-05-10 ENCOUNTER — Other Ambulatory Visit (HOSPITAL_BASED_OUTPATIENT_CLINIC_OR_DEPARTMENT_OTHER): Payer: Self-pay

## 2021-05-13 ENCOUNTER — Other Ambulatory Visit (HOSPITAL_BASED_OUTPATIENT_CLINIC_OR_DEPARTMENT_OTHER): Payer: Self-pay

## 2021-05-14 ENCOUNTER — Encounter: Payer: Self-pay | Admitting: Family

## 2021-05-14 ENCOUNTER — Other Ambulatory Visit (HOSPITAL_BASED_OUTPATIENT_CLINIC_OR_DEPARTMENT_OTHER): Payer: Self-pay

## 2021-05-19 ENCOUNTER — Ambulatory Visit: Payer: No Typology Code available for payment source | Admitting: Sports Medicine

## 2021-05-19 ENCOUNTER — Other Ambulatory Visit (HOSPITAL_BASED_OUTPATIENT_CLINIC_OR_DEPARTMENT_OTHER): Payer: Self-pay

## 2021-05-25 ENCOUNTER — Other Ambulatory Visit: Payer: Self-pay | Admitting: Medical-Surgical

## 2021-05-25 ENCOUNTER — Encounter: Payer: Self-pay | Admitting: Medical-Surgical

## 2021-05-25 ENCOUNTER — Other Ambulatory Visit (HOSPITAL_BASED_OUTPATIENT_CLINIC_OR_DEPARTMENT_OTHER): Payer: Self-pay

## 2021-05-25 ENCOUNTER — Encounter: Payer: Self-pay | Admitting: Family

## 2021-05-25 DIAGNOSIS — E1159 Type 2 diabetes mellitus with other circulatory complications: Secondary | ICD-10-CM

## 2021-05-25 MED ORDER — INSULIN GLARGINE-YFGN 100 UNIT/ML ~~LOC~~ SOPN
PEN_INJECTOR | SUBCUTANEOUS | 11 refills | Status: DC
  Filled 2021-05-25: qty 15, 19d supply, fill #0
  Filled 2021-07-01: qty 15, 19d supply, fill #1
  Filled 2021-09-03: qty 15, 19d supply, fill #2
  Filled 2021-09-29: qty 15, 19d supply, fill #3
  Filled 2021-11-27: qty 15, 19d supply, fill #4
  Filled 2021-12-21: qty 15, 19d supply, fill #5
  Filled 2022-02-28: qty 15, 19d supply, fill #6
  Filled 2022-03-18: qty 15, 19d supply, fill #7

## 2021-06-09 ENCOUNTER — Inpatient Hospital Stay: Payer: No Typology Code available for payment source | Attending: Internal Medicine

## 2021-06-09 ENCOUNTER — Inpatient Hospital Stay (HOSPITAL_BASED_OUTPATIENT_CLINIC_OR_DEPARTMENT_OTHER): Payer: No Typology Code available for payment source | Admitting: Family

## 2021-06-09 ENCOUNTER — Encounter: Payer: Self-pay | Admitting: Family

## 2021-06-09 ENCOUNTER — Other Ambulatory Visit: Payer: Self-pay

## 2021-06-09 ENCOUNTER — Telehealth: Payer: Self-pay | Admitting: *Deleted

## 2021-06-09 VITALS — BP 116/69 | HR 66 | Temp 98.0°F | Resp 18 | Wt 209.8 lb

## 2021-06-09 DIAGNOSIS — D509 Iron deficiency anemia, unspecified: Secondary | ICD-10-CM | POA: Diagnosis not present

## 2021-06-09 DIAGNOSIS — E538 Deficiency of other specified B group vitamins: Secondary | ICD-10-CM

## 2021-06-09 LAB — IRON AND IRON BINDING CAPACITY (CC-WL,HP ONLY)
Iron: 57 ug/dL (ref 28–170)
Saturation Ratios: 14 % (ref 10.4–31.8)
TIBC: 414 ug/dL (ref 250–450)
UIBC: 357 ug/dL (ref 148–442)

## 2021-06-09 LAB — CBC WITH DIFFERENTIAL (CANCER CENTER ONLY)
Abs Immature Granulocytes: 0.05 10*3/uL (ref 0.00–0.07)
Basophils Absolute: 0.1 10*3/uL (ref 0.0–0.1)
Basophils Relative: 1 %
Eosinophils Absolute: 1.1 10*3/uL — ABNORMAL HIGH (ref 0.0–0.5)
Eosinophils Relative: 11 %
HCT: 38.5 % (ref 36.0–46.0)
Hemoglobin: 12.7 g/dL (ref 12.0–15.0)
Immature Granulocytes: 1 %
Lymphocytes Relative: 13 %
Lymphs Abs: 1.4 10*3/uL (ref 0.7–4.0)
MCH: 27.4 pg (ref 26.0–34.0)
MCHC: 33 g/dL (ref 30.0–36.0)
MCV: 83 fL (ref 80.0–100.0)
Monocytes Absolute: 0.5 10*3/uL (ref 0.1–1.0)
Monocytes Relative: 5 %
Neutro Abs: 7.4 10*3/uL (ref 1.7–7.7)
Neutrophils Relative %: 69 %
Platelet Count: 281 10*3/uL (ref 150–400)
RBC: 4.64 MIL/uL (ref 3.87–5.11)
RDW: 15.2 % (ref 11.5–15.5)
WBC Count: 10.6 10*3/uL — ABNORMAL HIGH (ref 4.0–10.5)
nRBC: 0 % (ref 0.0–0.2)

## 2021-06-09 LAB — RETICULOCYTES
Immature Retic Fract: 16.3 % — ABNORMAL HIGH (ref 2.3–15.9)
RBC.: 4.66 MIL/uL (ref 3.87–5.11)
Retic Count, Absolute: 98.8 10*3/uL (ref 19.0–186.0)
Retic Ct Pct: 2.1 % (ref 0.4–3.1)

## 2021-06-09 LAB — VITAMIN B12: Vitamin B-12: 247 pg/mL (ref 180–914)

## 2021-06-09 LAB — FERRITIN: Ferritin: 202 ng/mL (ref 11–307)

## 2021-06-09 LAB — FOLATE: Folate: 18.5 ng/mL (ref >5.9)

## 2021-06-09 NOTE — Progress Notes (Signed)
?Hematology and Oncology Follow Up Visit ? ?Jessica [name] ?Jessica [name]:[id number] ?1955-07-11 66 y.o. ?06/09/2021 ? ? ?Principle Diagnosis:  ?Iron deficiency anemia ? ?Current Therapy:   ?IV iron as indicated  ?  ?Interim History:  Ms. Jessica [name] is here today for follow-up. She is doing fairly well but still notes some fatigue. She is under some stress at home right now.  ?She has not noted any blood loss. No abnormal bruising, no petechiae.  ?She has GERD.  ?No fever, chills, n/v, cough, rash, dizziness, SOB, chest pain, palpitations, abdominal pain or changes in bowel or bladder habits.  ?She states that her fluid retention in the lower extremities is minimal as long as she takes her Zestoretic.  ?Numbness and tingling in her hands and feet is unchanged from baseline.  ?No falls or syncope to report.  ?Her appetite comes and goes. She is doing her best to stay well hydrated throughout the day. Her weight is stable at 209 lbs.  ? ?ECOG Performance Status: 1 - Symptomatic but completely ambulatory ? ?Medications:  ?Allergies as of 06/09/2021   ? ?   Reactions  ? Aspirin   ? Bruising with long term use  ? ?  ? ?  ?Medication List  ?  ? ?  ? Accurate as of June 09, 2021  8:35 AM. If you have any questions, ask your nurse or doctor.  ?  ?  ? ?  ? ?amLODipine 5 MG tablet ?Commonly known as: NORVASC ?Take 1 tablet (5 mg total) by mouth daily. ?  ?aspirin 81 MG tablet ?Take 81 mg by mouth at bedtime. ?  ?ferrous sulfate 325 (65 FE) MG tablet ?Take 1 tablet (325 mg total) by mouth 2 (two) times daily with a meal. ?  ?lisinopril-hydrochlorothiazide 10-12.5 MG tablet ?Commonly known as: ZESTORETIC ?Take 1 tablet by mouth daily. ?  ?magnesium (amino acid chelate) 133 MG tablet ?Take by mouth. ?  ?Melatonin 5 MG Chew ?Chew 5 mg by mouth 3 times/day as needed-between meals & bedtime. ?  ?metFORMIN 1000 MG tablet ?Commonly known as: GLUCOPHAGE ?Take 1 tablet (1,000 mg total) by mouth 2 (two) times daily with a meal. ?  ?Ozempic (0.25  or 0.5 MG/DOSE) 2 MG/1.5ML Sopn ?Generic drug: Semaglutide(0.25 or 0.5MG /DOS) ?Inject 0.5 mg into the skin once a week. ?  ?rosuvastatin 40 MG tablet ?Commonly known as: CRESTOR ?TAKE 1 TABLET (40 MG TOTAL) BY MOUTH AT BEDTIME. ?  ?Semglee (yfgn) 100 UNIT/ML Pen ?Generic drug: insulin glargine-yfgn ?INJECT 50-80 UNITS INTO THE SKIN AT BEDTIME. INCREASE BY 2 UNITS EVERY 3 DAYS UNTIL FBS CONSISTENTLY 90-130 THEN STAY AT THAT DOSE. ?  ?sertraline 100 MG tablet ?Commonly known as: ZOLOFT ?Take 1 tablet (100 mg total) by mouth daily. ?  ?traMADol 50 MG tablet ?Commonly known as: ULTRAM ?Take 1 tablet (50 mg total) by mouth every 8 (eight) hours as needed for moderate pain. Maximum of 6 tablets per day. ?  ? ?  ? ? ?Allergies:  ?Allergies  ?Allergen Reactions  ? Aspirin   ?  Bruising with long term use  ? ? ?Past Medical History, Surgical history, Social history, and Family History were reviewed and updated. ? ?Review of Systems: ?All other 10 point review of systems is negative.  ? ?Physical Exam: ? weight is 209 lb 12.8 oz (95.2 kg). Her oral temperature is 98 ?F (36.7 ?C). Her blood pressure is 116/69 and her pulse is 66. Her respiration is 18 and oxygen saturation is 100%.  ? ?  Wt Readings from Last 3 Encounters:  ?06/09/21 209 lb 12.8 oz (95.2 kg)  ?04/12/21 206 lb 1.9 oz (93.5 kg)  ?04/05/21 210 lb (95.3 kg)  ? ? ?Ocular: Sclerae unicteric, pupils equal, round and reactive to light ?Ear-nose-throat: Oropharynx clear, dentition fair ?Lymphatic: No cervical or supraclavicular adenopathy ?Lungs no rales or rhonchi, good excursion bilaterally ?Heart regular rate and rhythm, no murmur appreciated ?Abd soft, nontender, positive bowel sounds ?MSK no focal spinal tenderness, no joint edema ?Neuro: non-focal, well-oriented, appropriate affect ?Breasts: Deferred  ? ?Lab Results  ?Component Value Date  ? WBC 10.6 (H) 06/09/2021  ? HGB 12.7 06/09/2021  ? HCT 38.5 06/09/2021  ? MCV 83.0 06/09/2021  ? PLT 281 06/09/2021  ? ?Lab  Results  ?Component Value Date  ? FERRITIN 25 04/12/2021  ? IRON 48 04/12/2021  ? TIBC 489 (H) 04/12/2021  ? UIBC 441 04/12/2021  ? IRONPCTSAT 10 (L) 04/12/2021  ? ?Lab Results  ?Component Value Date  ? RETICCTPCT 2.1 06/09/2021  ? RBC 4.66 06/09/2021  ? ?No results found for: KPAFRELGTCHN, LAMBDASER, KAPLAMBRATIO ?No results found for: IGGSERUM, IGA, IGMSERUM ?No results found for: TOTALPROTELP, ALBUMINELP, A1GS, A2GS, BETS, BETA2SER, GAMS, MSPIKE, SPEI ?  Chemistry   ?   ?Component Value Date/Time  ? NA 135 04/12/2021 0802  ? K 3.5 04/12/2021 0802  ? CL 97 (L) 04/12/2021 0802  ? CO2 30 04/12/2021 0802  ? BUN 12 04/12/2021 0802  ? CREATININE 0.75 04/12/2021 0802  ? CREATININE 0.73 04/05/2021 0000  ?    ?Component Value Date/Time  ? CALCIUM 9.9 04/12/2021 0802  ? ALKPHOS 82 04/12/2021 0802  ? AST 16 04/12/2021 0802  ? ALT 14 04/12/2021 0802  ? BILITOT 0.5 04/12/2021 0802  ?  ? ? ? ?Impression and Plan: Ms. Jessica [name] is a very pleasant 66 yo caucasian female with long history of iron deficiency anemia.  ?Iron studies are pending.  ?Follow-up in 3 months.  ? ?Lottie Dawson, NP ?3/22/20238:35 AM ? ?

## 2021-06-09 NOTE — Telephone Encounter (Signed)
Per 06/09/21 los - called and lvm of upcoming appointments - requested call back to confirm ?

## 2021-07-01 ENCOUNTER — Other Ambulatory Visit (HOSPITAL_BASED_OUTPATIENT_CLINIC_OR_DEPARTMENT_OTHER): Payer: Self-pay

## 2021-07-01 ENCOUNTER — Other Ambulatory Visit: Payer: Self-pay | Admitting: Medical-Surgical

## 2021-07-01 MED ORDER — LISINOPRIL-HYDROCHLOROTHIAZIDE 10-12.5 MG PO TABS
1.0000 | ORAL_TABLET | Freq: Every day | ORAL | 0 refills | Status: DC
  Filled 2021-07-01: qty 90, 90d supply, fill #0

## 2021-08-10 ENCOUNTER — Other Ambulatory Visit (HOSPITAL_BASED_OUTPATIENT_CLINIC_OR_DEPARTMENT_OTHER): Payer: Self-pay

## 2021-08-13 ENCOUNTER — Inpatient Hospital Stay (HOSPITAL_COMMUNITY)
Admission: EM | Admit: 2021-08-13 | Discharge: 2021-08-15 | DRG: 519 | Disposition: A | Discharge status: Home / Self Care | Payer: No Typology Code available for payment source | Attending: Family Medicine | Admitting: Family Medicine

## 2021-08-13 ENCOUNTER — Inpatient Hospital Stay (HOSPITAL_COMMUNITY): Payer: No Typology Code available for payment source

## 2021-08-13 ENCOUNTER — Other Ambulatory Visit: Payer: Self-pay

## 2021-08-13 ENCOUNTER — Encounter (HOSPITAL_COMMUNITY): Payer: Self-pay | Admitting: Emergency Medicine

## 2021-08-13 DIAGNOSIS — I10 Essential (primary) hypertension: Secondary | ICD-10-CM | POA: Diagnosis present

## 2021-08-13 DIAGNOSIS — Z886 Allergy status to analgesic agent status: Secondary | ICD-10-CM | POA: Diagnosis not present

## 2021-08-13 DIAGNOSIS — G43909 Migraine, unspecified, not intractable, without status migrainosus: Secondary | ICD-10-CM | POA: Diagnosis present

## 2021-08-13 DIAGNOSIS — R519 Headache, unspecified: Secondary | ICD-10-CM | POA: Diagnosis present

## 2021-08-13 DIAGNOSIS — F419 Anxiety disorder, unspecified: Secondary | ICD-10-CM | POA: Diagnosis present

## 2021-08-13 DIAGNOSIS — Z6841 Body Mass Index (BMI) 40.0 and over, adult: Secondary | ICD-10-CM | POA: Diagnosis not present

## 2021-08-13 DIAGNOSIS — Z833 Family history of diabetes mellitus: Secondary | ICD-10-CM | POA: Diagnosis not present

## 2021-08-13 DIAGNOSIS — E119 Type 2 diabetes mellitus without complications: Secondary | ICD-10-CM | POA: Diagnosis present

## 2021-08-13 DIAGNOSIS — Z8249 Family history of ischemic heart disease and other diseases of the circulatory system: Secondary | ICD-10-CM | POA: Diagnosis not present

## 2021-08-13 DIAGNOSIS — M5116 Intervertebral disc disorders with radiculopathy, lumbar region: Principal | ICD-10-CM | POA: Diagnosis present

## 2021-08-13 DIAGNOSIS — F32A Depression, unspecified: Secondary | ICD-10-CM | POA: Diagnosis present

## 2021-08-13 DIAGNOSIS — E785 Hyperlipidemia, unspecified: Secondary | ICD-10-CM | POA: Diagnosis present

## 2021-08-13 DIAGNOSIS — R809 Proteinuria, unspecified: Secondary | ICD-10-CM

## 2021-08-13 DIAGNOSIS — Z79899 Other long term (current) drug therapy: Secondary | ICD-10-CM | POA: Diagnosis not present

## 2021-08-13 DIAGNOSIS — Z794 Long term (current) use of insulin: Secondary | ICD-10-CM | POA: Diagnosis not present

## 2021-08-13 DIAGNOSIS — Z83438 Family history of other disorder of lipoprotein metabolism and other lipidemia: Secondary | ICD-10-CM | POA: Diagnosis not present

## 2021-08-13 DIAGNOSIS — Z7984 Long term (current) use of oral hypoglycemic drugs: Secondary | ICD-10-CM | POA: Diagnosis not present

## 2021-08-13 DIAGNOSIS — E1129 Type 2 diabetes mellitus with other diabetic kidney complication: Secondary | ICD-10-CM

## 2021-08-13 HISTORY — DX: Other complications of anesthesia, initial encounter: T88.59XA

## 2021-08-13 LAB — BASIC METABOLIC PANEL
Anion gap: 10 (ref 5–15)
BUN: 12 mg/dL (ref 8–23)
CO2: 28 mmol/L (ref 22–32)
Calcium: 9.1 mg/dL (ref 8.9–10.3)
Chloride: 99 mmol/L (ref 98–111)
Creatinine, Ser: 0.69 mg/dL (ref 0.44–1.00)
GFR, Estimated: >60 mL/min (ref >60)
Glucose, Bld: 280 mg/dL — ABNORMAL HIGH (ref 70–99)
Potassium: 3.6 mmol/L (ref 3.5–5.1)
Sodium: 137 mmol/L (ref 135–145)

## 2021-08-13 LAB — CBC
HCT: 38.9 % (ref 36.0–46.0)
Hemoglobin: 12.7 g/dL (ref 12.0–15.0)
MCH: 28 pg (ref 26.0–34.0)
MCHC: 32.6 g/dL (ref 30.0–36.0)
MCV: 85.7 fL (ref 80.0–100.0)
Platelets: 269 10*3/uL (ref 150–400)
RBC: 4.54 MIL/uL (ref 3.87–5.11)
RDW: 14 % (ref 11.5–15.5)
WBC: 8.8 10*3/uL (ref 4.0–10.5)
nRBC: 0 % (ref 0.0–0.2)

## 2021-08-13 LAB — CBG MONITORING, ED: Glucose-Capillary: 155 mg/dL — ABNORMAL HIGH (ref 70–99)

## 2021-08-13 LAB — GLUCOSE, CAPILLARY: Glucose-Capillary: 277 mg/dL — ABNORMAL HIGH (ref 70–99)

## 2021-08-13 IMAGING — MR MR LUMBAR SPINE W/O CM
4 of 5 series · 19 of 48 positions shown · non-contrast
Comparison: None Available.

CLINICAL DATA: Low back pain, increased fracture risk disc
protrusion

EXAM:
MRI LUMBAR SPINE WITHOUT CONTRAST
TECHNIQUE: Multiplanar, multisequence MR imaging of the lumbar spine was
performed. No intravenous contrast was administered.

[Series 2: T2 · sagittal · 4.0mm · 0.55mm/px · 6 of 15 slices shown (1 of 2)]
[im 1/15]
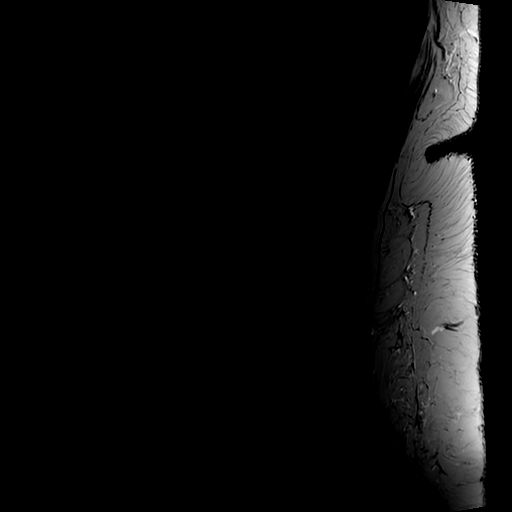
[im 3/15]
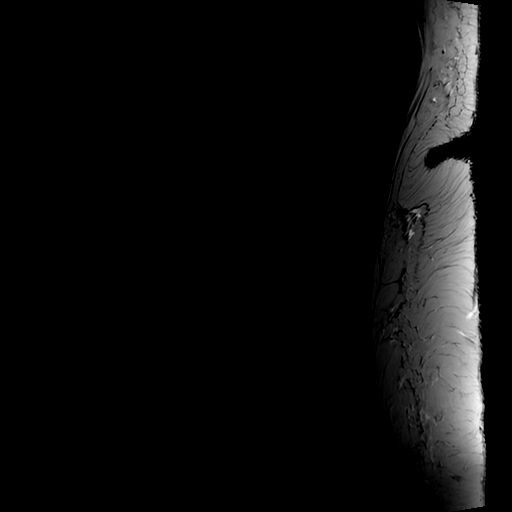
[im 6/15]
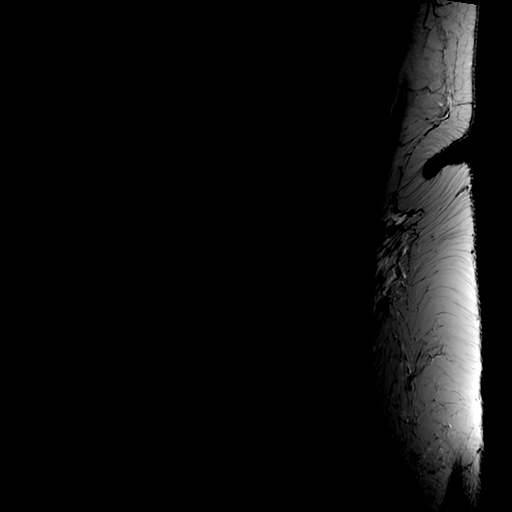
[im 9/15]
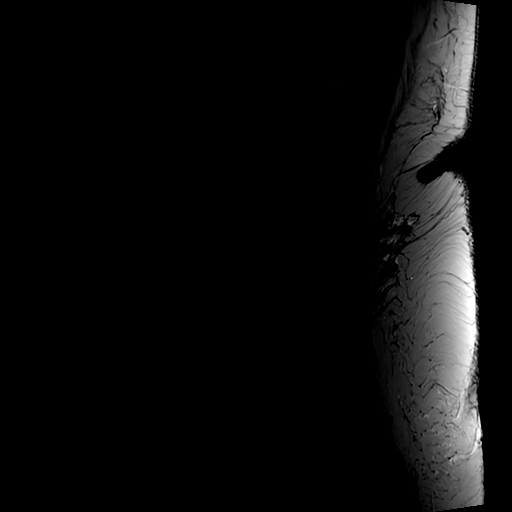
[im 12/15]
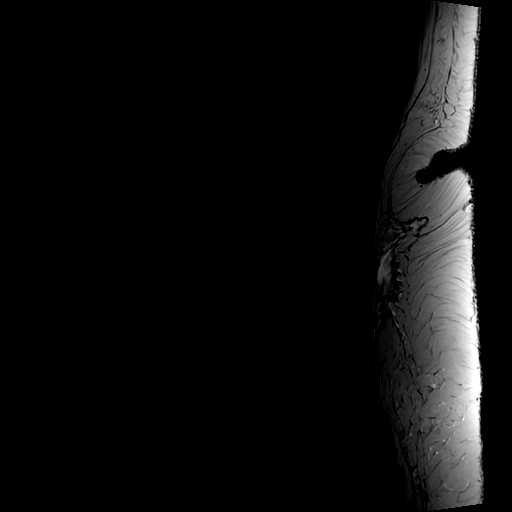
[im 15/15]
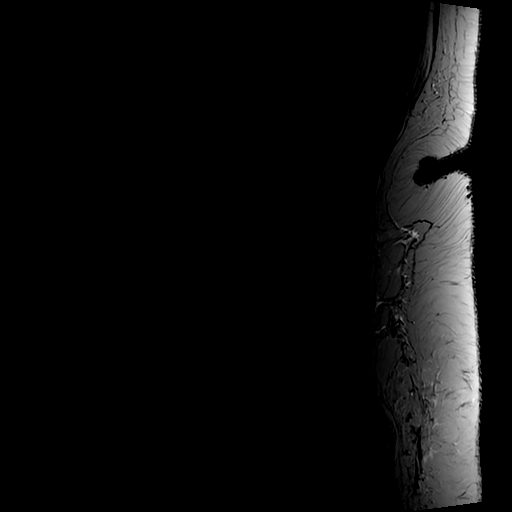

[Series 4: T1 · sagittal · 4.0mm · 0.55mm/px · 3 of 15 slices shown (1 of 2)]
[im 3/15]
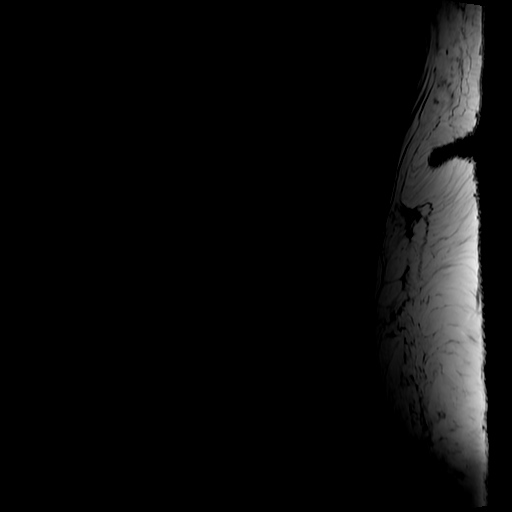
[im 9/15]
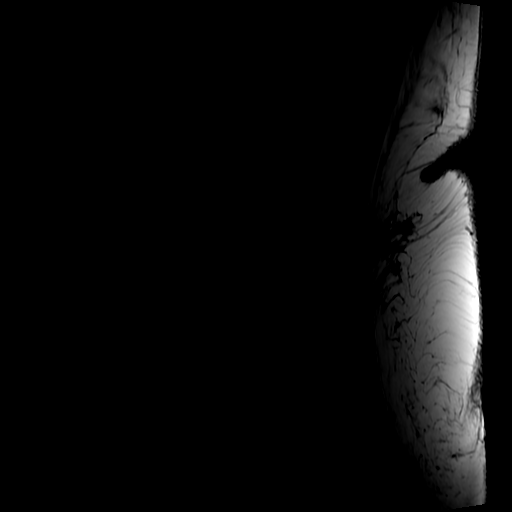
[im 15/15]
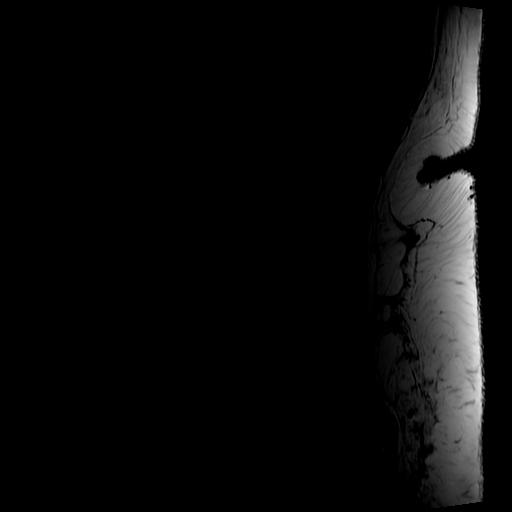

[Series 7: T2 · axial · 4.0mm · 0.39mm/px · z∈[-42,+134]mm · 7 of 41 slices shown (2 of 2)]
[im 1/41]
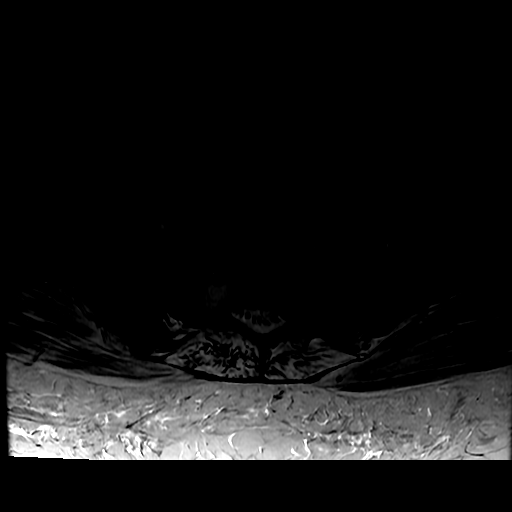
[im 6/41]
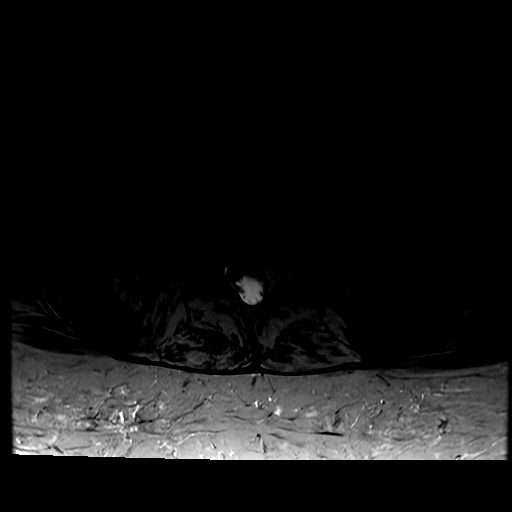
[im 12/41]
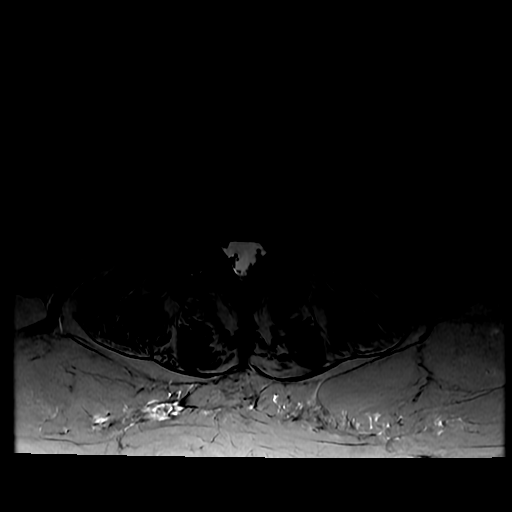
[im 18/41]
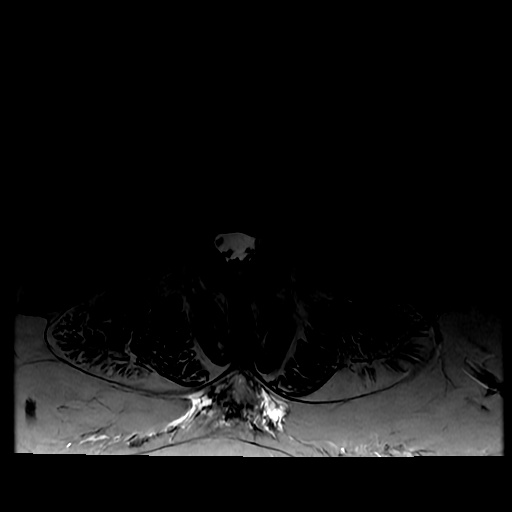
[im 21/41]
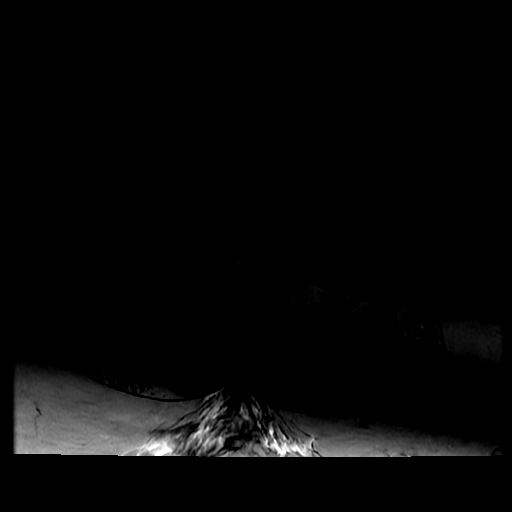
[im 23/41]
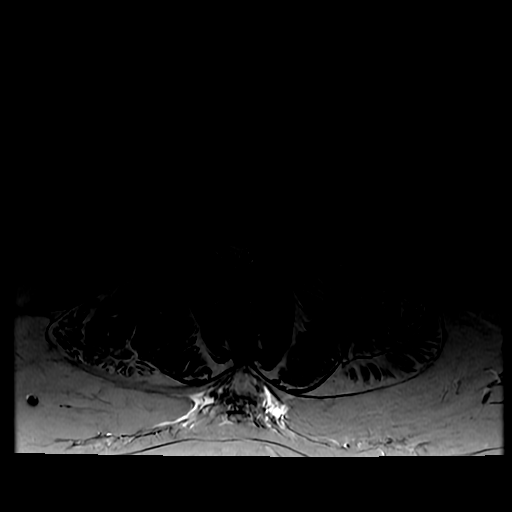
[im 35/41]
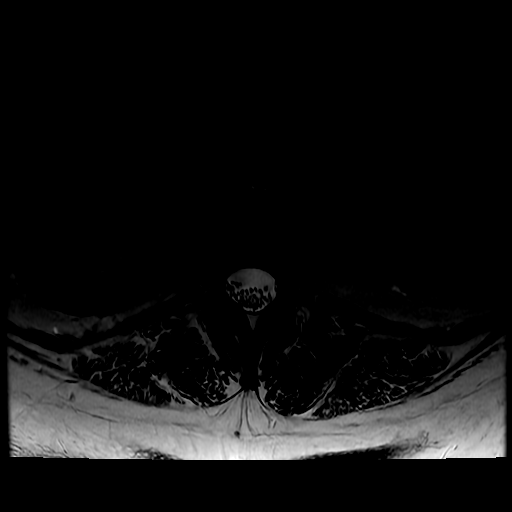

[Series 8: T1 · axial · 4.0mm · 0.39mm/px · z∈[-17,+134]mm · 3 of 41 slices shown (2 of 2)]
[im 6/41]
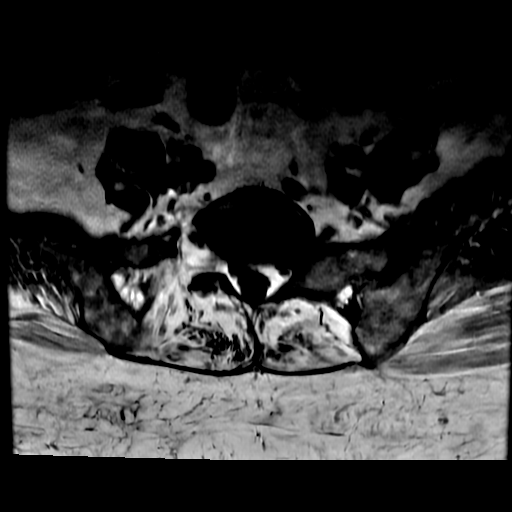
[im 21/41]
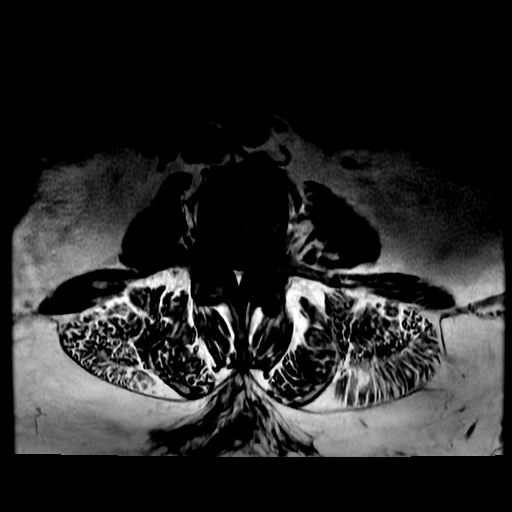
[im 35/41]
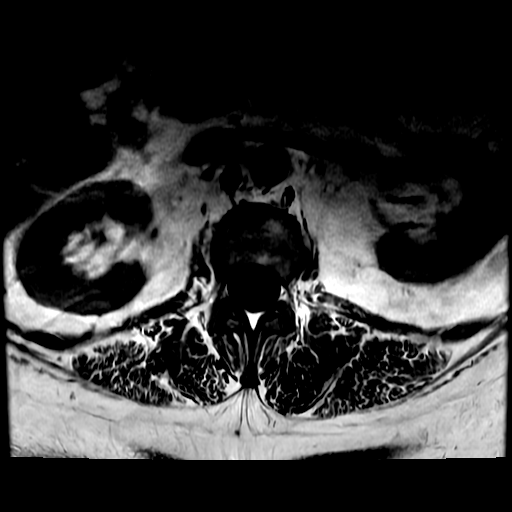

[19 of 48 positions shown; findings below may reference images not displayed]

FINDINGS: Segmentation: Standard segmentation is a soon. The inferior-most
fully formed intervertebral disc labeled L5-S1.

Alignment: Grade 1 retrolisthesis of L1 on L2 and L2 on L3. Grade 1
anterolisthesis of L4 on L5.

Vertebrae: Vertebral body heights are maintained. No specific
evidence of acute fracture or discitis/osteomyelitis. No suspicious
bone lesions.

Conus medullaris and cauda equina: Conus extends to the T12-L1
level. Conus appears normal.

Paraspinal and other soft tissues: Unremarkable.

Disc levels:

T12-L1: Tiny central disc protrusion without significant stenosis.

L1-L2: Mild disc bulging without significant stenosis. Bilateral
facet arthropathy with small facet joint effusions.

L2-L3: Broad disc bulge with superimposed left
subarticular/foraminal disc protrusion which contacts, potentially
affecting the descending left L3 nerve roots. Mild left foraminal
stenosis. Mild central canal stenosis.

L3-L4: Left foraminal disc protrusion without significant canal or
foraminal stenosis.

L4-L5: Broad disc bulging with superimposed small right paracentral
disc protrusion. No significant canal or foraminal stenosis.

L5-S1: Bilateral facet arthropathy without significant stenosis.
IMPRESSION: 1. At L2-L3, left subarticular/foraminal disc protrusion with
moderate stenosis of the left subarticular recess, potentially
affecting the descending left L3 nerve roots. Mild left foraminal
stenosis at this level.
2. Additional milder multilevel degenerative changes detailed above.

## 2021-08-13 MED ORDER — SODIUM CHLORIDE 0.9% FLUSH
3.0000 mL | Freq: Two times a day (BID) | INTRAVENOUS | Status: DC
  Administered 2021-08-14 – 2021-08-15 (×3): 3 mL via INTRAVENOUS

## 2021-08-13 MED ORDER — ACETAMINOPHEN 325 MG PO TABS
650.0000 mg | ORAL_TABLET | Freq: Four times a day (QID) | ORAL | Status: DC | PRN
  Administered 2021-08-13 – 2021-08-14 (×2): 650 mg via ORAL
  Filled 2021-08-13 (×3): qty 2

## 2021-08-13 MED ORDER — SODIUM CHLORIDE 0.9% FLUSH
3.0000 mL | INTRAVENOUS | Status: DC | PRN
  Administered 2021-08-13: 3 mL via INTRAVENOUS

## 2021-08-13 MED ORDER — HYDROCODONE-ACETAMINOPHEN 5-325 MG PO TABS
1.0000 | ORAL_TABLET | ORAL | Status: DC | PRN
  Administered 2021-08-15: 2 via ORAL
  Administered 2021-08-15: 1 via ORAL
  Filled 2021-08-13: qty 1
  Filled 2021-08-13: qty 2

## 2021-08-13 MED ORDER — HYDROCHLOROTHIAZIDE 12.5 MG PO TABS
12.5000 mg | ORAL_TABLET | Freq: Every day | ORAL | Status: DC
  Administered 2021-08-14 – 2021-08-15 (×2): 12.5 mg via ORAL
  Filled 2021-08-13 (×2): qty 1

## 2021-08-13 MED ORDER — MELATONIN 5 MG PO TABS: 5.0000 mg | ORAL_TABLET | Freq: Every evening | ORAL | Status: DC | PRN

## 2021-08-13 MED ORDER — SODIUM CHLORIDE 0.9 % IV SOLN
250.0000 mL | INTRAVENOUS | Status: DC | PRN
  Administered 2021-08-14: 250 mL via INTRAVENOUS

## 2021-08-13 MED ORDER — INSULIN GLARGINE-YFGN 100 UNIT/ML ~~LOC~~ SOLN
35.0000 [IU] | Freq: Every day | SUBCUTANEOUS | Status: DC
  Administered 2021-08-13 – 2021-08-14 (×2): 35 [IU] via SUBCUTANEOUS
  Filled 2021-08-13 (×5): qty 0.35

## 2021-08-13 MED ORDER — MORPHINE SULFATE (PF) 2 MG/ML IV SOLN
1.0000 mg | INTRAVENOUS | Status: DC | PRN
  Administered 2021-08-14: 1 mg via INTRAVENOUS
  Filled 2021-08-13: qty 1

## 2021-08-13 MED ORDER — SERTRALINE HCL 100 MG PO TABS
100.0000 mg | ORAL_TABLET | Freq: Every day | ORAL | Status: DC
  Administered 2021-08-14 – 2021-08-15 (×2): 100 mg via ORAL
  Filled 2021-08-13 (×2): qty 1

## 2021-08-13 MED ORDER — GABAPENTIN 100 MG PO CAPS
100.0000 mg | ORAL_CAPSULE | Freq: Two times a day (BID) | ORAL | Status: DC
  Administered 2021-08-13 – 2021-08-15 (×4): 100 mg via ORAL
  Filled 2021-08-13 (×4): qty 1

## 2021-08-13 MED ORDER — HYDROCODONE-ACETAMINOPHEN 5-325 MG PO TABS
1.0000 | ORAL_TABLET | Freq: Once | ORAL | Status: AC
  Administered 2021-08-13: 1 via ORAL
  Filled 2021-08-13: qty 1

## 2021-08-13 MED ORDER — ROSUVASTATIN CALCIUM 20 MG PO TABS
40.0000 mg | ORAL_TABLET | Freq: Every day | ORAL | Status: DC
  Administered 2021-08-14 – 2021-08-15 (×2): 40 mg via ORAL
  Filled 2021-08-13 (×2): qty 2

## 2021-08-13 MED ORDER — LISINOPRIL-HYDROCHLOROTHIAZIDE 10-12.5 MG PO TABS: 1.0000 | ORAL_TABLET | Freq: Every day | ORAL | Status: DC

## 2021-08-13 MED ORDER — INSULIN ASPART 100 UNIT/ML IJ SOLN
0.0000 [IU] | Freq: Three times a day (TID) | INTRAMUSCULAR | Status: DC
  Administered 2021-08-14: 2 [IU] via SUBCUTANEOUS
  Administered 2021-08-14 – 2021-08-15 (×3): 3 [IU] via SUBCUTANEOUS

## 2021-08-13 MED ORDER — INSULIN GLARGINE-YFGN 100 UNIT/ML ~~LOC~~ SOPN: 35.0000 [IU] | PEN_INJECTOR | Freq: Every day | SUBCUTANEOUS | Status: DC

## 2021-08-13 MED ORDER — LISINOPRIL 10 MG PO TABS
10.0000 mg | ORAL_TABLET | Freq: Every day | ORAL | Status: DC
  Administered 2021-08-14 – 2021-08-15 (×2): 10 mg via ORAL
  Filled 2021-08-13 (×2): qty 1

## 2021-08-13 NOTE — Assessment & Plan Note (Signed)
Tylenol PRN

## 2021-08-13 NOTE — Assessment & Plan Note (Addendum)
A1C of 6.6 in 03/2021 Hold metformin Continue her long acting insulin, but will decrease dose while NPO  SSI and accuchecks per protocol

## 2021-08-13 NOTE — ED Notes (Signed)
Called floor back to notify them of the importance of moving patient up from ED to floor due to staffing issues. Nurse not available at this time due to taking care of other patient, advised she will call back

## 2021-08-13 NOTE — Assessment & Plan Note (Signed)
-  Continue zoloft  

## 2021-08-13 NOTE — ED Provider Notes (Signed)
[facility] EMERGENCY DEPARTMENT Provider Note   CSN: [id number] Arrival date & time: 08/13/21  0850     History  Chief Complaint  Patient presents with   Extremity Weakness    Jessica [name] is a 66 y.o. female.  HPI  66 year old female presents emergency department with ongoing lower back pain/left leg pain and leg weakness.  Patient was seen yesterday at [facility], had an MRI of the lower back which showed disc protrusion with L3 nerve root impingement.  Neurosurgery was consulted, Dr. Petra Kuba requested outpatient office follow-up.  However at home patient is having worsening weakness and leg pain.  Now unable to stand or use walker or the restroom independently, requiring a 2-3 person assist and still unable to walk.  She denies any saddle numbness/incontinence.  No fever, no other acute trauma to the lower back.  No other new neurologic symptoms, headache, focal weakness/numbness.  Home Medications Prior to Admission medications   Medication Sig Start Date End Date Taking? Authorizing Provider  amLODipine (NORVASC) 5 MG tablet Take 1 tablet (5 mg total) by mouth daily. 04/05/21   Christen Butter, NP  aspirin 81 MG tablet Take 81 mg by mouth at bedtime.    [provider]  ferrous sulfate 325 (65 FE) MG tablet Take 1 tablet (325 mg total) by mouth 2 (two) times daily with a meal. Patient not taking: Reported on 06/09/2021 02/08/19   Sunnie Nielsen, DO  insulin glargine-yfgn (SEMGLEE) 100 UNIT/ML Pen INJECT 50-80 UNITS INTO THE SKIN AT BEDTIME. INCREASE BY 2 UNITS EVERY 3 DAYS UNTIL FBS CONSISTENTLY 90-130 THEN STAY AT THAT DOSE. 05/25/21   Christen Butter, NP  lisinopril-hydrochlorothiazide (ZESTORETIC) 10-12.5 MG tablet Take 1 tablet by mouth daily. 07/01/21   Christen Butter, NP  Melatonin 5 MG CHEW Chew 5 mg by mouth 3 times/day as needed-between meals & bedtime.     [provider]  metFORMIN (GLUCOPHAGE) 1000 MG tablet Take 1 tablet (1,000  mg total) by mouth 2 (two) times daily with a meal. 04/23/21 04/23/22  Christen Butter, NP  rosuvastatin (CRESTOR) 40 MG tablet TAKE 1 TABLET (40 MG TOTAL) BY MOUTH AT BEDTIME. 03/09/21 03/09/22  Christen Butter, NP  Semaglutide,0.25 or 0.5MG /DOS, (OZEMPIC, 0.25 OR 0.5 MG/DOSE,) 2 MG/1.5ML SOPN Inject 0.5 mg into the skin once a week. 09/14/20   Christen Butter, NP  sertraline (ZOLOFT) 100 MG tablet Take 1 tablet (100 mg total) by mouth daily. 09/14/20 09/14/21  Christen Butter, NP  Specialty Vitamins Products (MAGNESIUM, AMINO ACID CHELATE,) 133 MG tablet Take by mouth.    [provider]  traMADol (ULTRAM) 50 MG tablet Take 1 tablet (50 mg total) by mouth every 8 (eight) hours as needed for moderate pain. Maximum of 6 tablets per day. 03/09/21   Monica Becton, MD      Allergies    Aspirin    Review of Systems   Review of Systems  Constitutional:  Positive for fatigue. Negative for fever.  Respiratory:  Negative for shortness of breath.   Cardiovascular:  Negative for chest pain.  Gastrointestinal:  Negative for abdominal pain, diarrhea and vomiting.  Musculoskeletal:  Positive for back pain.  Skin:  Negative for rash.  Neurological:  Positive for weakness and numbness. Negative for dizziness, facial asymmetry, speech difficulty and headaches.   Physical Exam Updated Vital Signs BP (!) 121/55   Pulse 69   Temp 98.6 F (37 C) (Oral)   Resp 15   Ht 4\' 11"  (  1.499 m)   Wt 95.3 kg   SpO2 96%   BMI 42.41 kg/m  Physical Exam Vitals and nursing note reviewed.  Constitutional:      General: She is not in acute distress.    Appearance: Normal appearance.  HENT:     Head: Normocephalic.     Mouth/Throat:     Mouth: Mucous membranes are moist.  Cardiovascular:     Rate and Rhythm: Normal rate.  Pulmonary:     Effort: Pulmonary effort is normal. No respiratory distress.  Abdominal:     Palpations: Abdomen is soft.     Tenderness: There is no abdominal tenderness.  Musculoskeletal:      Comments: Weakness with left hip and knee extension/flexion, subjective decrease sensation of the left thigh into the inner thigh, equal palpable DP pulses, leg appears well perfused  Skin:    General: Skin is warm.  Neurological:     Mental Status: She is alert and oriented to person, place, and time. Mental status is at baseline.     Cranial Nerves: No cranial nerve deficit.  Psychiatric:        Mood and Affect: Mood normal.    ED Results / Procedures / Treatments   Labs (all labs ordered are listed, but only abnormal results are displayed) Labs Reviewed  BASIC METABOLIC PANEL - Abnormal; Notable for the following components:      Result Value   Glucose, Bld 280 (*)    All other components within normal limits  CBC    EKG None  Radiology No results found.  Procedures Procedures    Medications Ordered in ED Medications  HYDROcodone-acetaminophen (NORCO/VICODIN) 5-325 MG per tablet 1 tablet (1 tablet Oral Given 08/13/21 1105)    ED Course/ Medical Decision Making/ A&P                           Medical Decision Making Amount and/or Complexity of Data Reviewed Labs: ordered. Radiology: ordered.  Risk Prescription drug management. Decision regarding hospitalization.   66 year old female presents emergency department with ongoing lower back pain/left leg pain and weakness.  Recently diagnosed with an L2/L3 disc herniation.  Was originally sent home for outpatient neurosurgery follow-up however declined over the past day, now unable to stand, use a walker or ambulate with 2-3 person assist.  She has significant weakness in the left lower extremity, some subjective sensory changes, very minimal strength without pain.  Consulted with Dr. Maurice Small, nsgy. Given her degree of debility would benefit from medical admission, rpt MRI here and possible plan for surgical intervention while admitted. Plan for admission.         Final Clinical Impression(s) / ED  Diagnoses Final diagnoses:  None    Rx / DC Orders ED Discharge Orders     None         Rozelle Logan, DO 08/13/21 1630

## 2021-08-13 NOTE — ED Notes (Signed)
Patient to MRI via stretcher.

## 2021-08-13 NOTE — Assessment & Plan Note (Addendum)
Well controlled continue  Zestoretic, has not been taking norvasc on regular basis per [facility]

## 2021-08-13 NOTE — Discharge Planning (Signed)
RNCM following for disposition needs. °Freya Zobrist J. Caileb Rhue, RN, BSN, NCM [phone] ° °

## 2021-08-13 NOTE — ED Notes (Signed)
Called to give report on patient, no purple man assigned at this time. Will call back

## 2021-08-13 NOTE — ED Triage Notes (Signed)
Patient has been feeling weak in the L leg since Thursday and had a fall walking up the steps. Pt endorses numbness and tingling to same. Was seen at [facility] for same, and had an MRI and was found to have bulging discs in L5-L6. Patient was told she needed to have surgery but was referred to [facility] because [facility] can not do the surgery. Patient left but once she came home she realized she could not properly due any ADLs due to not having any strength.

## 2021-08-13 NOTE — Assessment & Plan Note (Signed)
Continue crestor 

## 2021-08-13 NOTE — Assessment & Plan Note (Addendum)
66 year old with history of lumbar radiculopathy with known L2/L3 disc herniation by MRI yesterday at [facility] presenting with worsening left leg weakness, numbness tingling and inability to walk as well as lower back pain -admit to inpatient -no incontinence of bowel or bladder, no saddle paraesthesias  -neurosurgery consulted and lumbar MRI pending. F/u on recs/surgery -NPO at midnight  -pain medication with oral norco and IV morphine for breakthrough pain -start low dose gabapentin BID  -SCDs

## 2021-08-13 NOTE — H&P (Signed)
History and Physical    Patient: Jessica [name] GEZ:[id number] DOB: 1955-03-28 DOA: 08/13/2021 DOS: the patient was seen and examined on 08/13/2021 PCP: Christen Butter, NP  Patient coming from: Home - lives alone, uses cane with right torn meniscus    Chief Complaint: left leg weakness, numbness, tingling and falls.   HPI: Jessica [name] is a 66 y.o. female with medical history significant of anxiety and depression, T2DM, HLD, HTN, migraines with aura, anemia with complaints of worsening mobility, decreased   She is a [employer] employee and worked 3rd shift Wednesday night. Thursday morning she had a hard time getting to her car without her left leg giving out. Someone had to help her get to her car. She was able to drive home, but when she got home she fell getting out of car.  Her daughter came over and she couldn't get her up so called EMS. They took her to [facility] yesterday. She had a MRI and they told her she had a disc protrusion in her lumbar spine. They sent her home and told her to go to a "better facility" if her symptoms got worse and to f/u with a neurosurgeon.  She can take very tiny steps with a walker, but she can not stand up on own. Her legs with buckle when standing and she will fall. Symptoms are in left leg and include: weakness, tingling and numbness. She also has pain rated as a 7/10. She also has pain in her left lower back. She denies any urinary incontinence or saddle parasthesias. Its hard to compensate with her right leg because of a torn meniscus.    She has been feeling good. Denies any fever/chills, vision changes/+headache, chest pain or palpitations, shortness of breath or cough, abdominal pain, N/V/D, dysuria. She has new leg swelling that started after she fell yesterday.   She does not smoke or drink  ER Course:  vitals: afebrile, bp: 130/75, HR: 81, RR: 20, oxygen: 98%RA Pertinent labs: glucose 280 MRI lumbar spine pending In ED: neurosurgery consulted,  SW consult. PT ordered    Review of Systems: As mentioned in the history of present illness. All other systems reviewed and are negative. 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 Dx was Anxiety   HERNIA REPAIR     LEG SURGERY     TUBAL LIGATION     Social History:  reports that she has never smoked. She has never used smokeless tobacco. She reports that she does not drink alcohol and does not use drugs.  Allergies  Allergen Reactions   Aspirin     Bruising with long term use    Family History  Problem Relation Age of Onset   Hyperlipidemia Mother    Hypertension Mother    Stroke Father    Thyroid cancer Sister    Diabetes Maternal Aunt    Skin cancer Maternal Uncle  Prior to Admission medications   Medication Sig Start Date End Date Taking? Authorizing Provider  amLODipine (NORVASC) 5 MG tablet Take 1 tablet (5 mg total) by mouth daily. 04/05/21  Yes Samuel Bouche, NP  lisinopril-hydrochlorothiazide (ZESTORETIC) 10-12.5 MG tablet Take 1 tablet by mouth daily. 07/01/21  Yes Samuel Bouche, NP  metFORMIN (GLUCOPHAGE) 1000 MG tablet Take 1 tablet (1,000 mg total) by mouth 2 (two) times daily with a meal. 04/23/21 04/23/22 Yes Jessup, Joy, NP  Multiple Vitamins-Minerals (EYE VITAMINS PO) Take 1 tablet by mouth daily.   Yes [provider]  rosuvastatin (CRESTOR) 40 MG tablet TAKE 1 TABLET (40 MG TOTAL) BY MOUTH AT BEDTIME. Patient taking differently: Take 40 mg by mouth daily. 03/09/21 03/09/22  Yes Samuel Bouche, NP  Semaglutide,0.25 or 0.5MG /DOS, (OZEMPIC, 0.25 OR 0.5 MG/DOSE,) 2 MG/1.5ML SOPN Inject 0.5 mg into the skin once a week. 09/14/20  Yes Samuel Bouche, NP  sertraline (ZOLOFT) 100 MG tablet Take 1 tablet (100 mg total) by mouth daily. 09/14/20 09/14/21 Yes Samuel Bouche, NP  Specialty Vitamins Products (MAGNESIUM, AMINO ACID CHELATE,) 133 MG tablet Take by mouth.   Yes [provider]  traMADol (ULTRAM) 50 MG tablet Take 1 tablet (50 mg total) by mouth every 8 (eight) hours as needed for moderate pain. Maximum of 6 tablets per day. 03/09/21  Yes Silverio Decamp, MD  ferrous sulfate 325 (65 FE) MG tablet Take 1 tablet (325 mg total) by mouth 2 (two) times daily with a meal. Patient not taking: Reported on 06/09/2021 02/08/19   Emeterio Reeve, DO  insulin glargine-yfgn (SEMGLEE) 100 UNIT/ML Pen INJECT 50-80 UNITS INTO THE SKIN AT BEDTIME. INCREASE BY 2 UNITS EVERY 3 DAYS UNTIL FBS CONSISTENTLY 90-130 THEN STAY AT THAT DOSE. Patient taking differently: Inject 50-80 Units into the skin See admin instructions. INJECT 50-80 UNITS INTO THE SKIN AT BEDTIME. INCREASE BY 2 UNITS EVERY 3 DAYS UNTIL FBS CONSISTENTLY 90-130 THEN STAY AT THAT DOSE. 05/25/21   Samuel Bouche, NP  Melatonin 5 MG CHEW Chew 5 mg by mouth at bedtime as needed (for sleep).    [provider]    Physical Exam: Vitals:   08/13/21 1548 08/13/21 1630 08/13/21 1700 08/13/21 2002  BP:  127/62 (!) 131/51 120/70  Pulse:  62 63 62  Resp:  13 16 17   Temp: 98.4 F (36.9 C)   98 F (36.7 C)  TempSrc: Oral   Oral  SpO2:  96% 97% 96%  Weight:      Height:       General:  Appears calm and comfortable and is in NAD. Obesity  Eyes:  PERRL, EOMI, normal lids, iris ENT:  grossly normal hearing, lips & tongue, mmm; appropriate dentition Neck:  no LAD, masses or thyromegaly; no carotid bruits Cardiovascular:  RRR, no m/r/g. No LE edema.  Respiratory:   CTA bilaterally with no wheezes/rales/rhonchi.  Normal  respiratory effort. Abdomen:  soft, NT, ND, NABS Back:   normal alignment, no CVAT. She has TTP across lower lumbar spine and SI joints.  Skin:  no rash or induration seen on limited exam Musculoskeletal:  grossly normal tone BUE and right BLE. Can not lift LLE off bed.   no bony abnormality Lower extremity:  No LE edema.  Limited foot exam with no ulcerations.  2+ distal pulses. Sensation intact  Psychiatric:  grossly normal mood and affect, speech fluent and appropriate, AOx3 Neurologic:  CN 2-12 grossly intact, moves all extremities in coordinated fashion, sensation intact  Radiological Exams on Admission: Independently reviewed - see discussion in A/P where applicable  MR LUMBAR SPINE WO CONTRAST  Result Date: 08/13/2021 CLINICAL DATA:  Low back pain, increased fracture risk disc protrusion EXAM: MRI LUMBAR SPINE WITHOUT CONTRAST TECHNIQUE: Multiplanar, multisequence MR imaging of the lumbar spine was performed. No intravenous contrast was administered. COMPARISON:  None Available. FINDINGS: Segmentation: Standard segmentation is a soon. The inferior-most fully formed intervertebral disc labeled L5-S1. Alignment: Grade 1 retrolisthesis of L1 on L2 and L2 on L3. Grade 1 anterolisthesis of L4 on L5. Vertebrae: Vertebral body heights are maintained. No specific evidence of acute fracture or discitis/osteomyelitis. No suspicious bone lesions. Conus medullaris and cauda equina: Conus extends to the T12-L1 level. Conus appears normal. Paraspinal and other soft tissues: Unremarkable. Disc levels: T12-L1: Tiny central disc protrusion without significant stenosis. L1-L2: Mild disc bulging without significant stenosis. Bilateral facet arthropathy with small facet joint effusions. L2-L3: Broad disc bulge with superimposed left subarticular/foraminal disc protrusion which contacts, potentially affecting the descending left L3 nerve roots. Mild left foraminal stenosis. Mild central canal stenosis. L3-L4: Left  foraminal disc protrusion without significant canal or foraminal stenosis. L4-L5: Broad disc bulging with superimposed small right paracentral disc protrusion. No significant canal or foraminal stenosis. L5-S1: Bilateral facet arthropathy without significant stenosis. IMPRESSION: 1. At L2-L3, left subarticular/foraminal disc protrusion with moderate stenosis of the left subarticular recess, potentially affecting the descending left L3 nerve roots. Mild left foraminal stenosis at this level. 2. Additional milder multilevel degenerative changes detailed above. Electronically Signed   By: Margaretha Sheffield M.D.   On: 08/13/2021 18:31    EKG: pending baseline    Labs on Admission: I have personally reviewed the available labs and imaging studies at the time of the admission.  Pertinent labs:     Assessment and Plan: Principal Problem:   Lumbar disc herniation with radiculopathy Active Problems:   Type 2 diabetes mellitus with microalbuminuria, with long-term current use of insulin (HCC)   Benign essential HTN   Hyperlipidemia   Anxiety and depression   Headache, chronic daily    Assessment and Plan: * Lumbar disc herniation with radiculopathy 66 year old with history of lumbar radiculopathy with known L2/L3 disc herniation by MRI yesterday at [facility] presenting with worsening left leg weakness, numbness tingling and inability to walk as well as lower back pain -admit to inpatient -no incontinence of bowel or bladder, no saddle paraesthesias  -neurosurgery consulted and lumbar MRI pending. F/u on recs/surgery -NPO at midnight  -pain medication with oral norco and IV morphine for breakthrough pain -start low dose gabapentin BID  -SCDs  Type 2 diabetes mellitus with microalbuminuria, with long-term current use of insulin (HCC) A1C of 6.6 in 03/2021 Hold metformin Continue her long acting insulin, but will decrease dose while NPO  SSI and accuchecks per protocol   Benign essential  HTN Well controlled continue  Zestoretic, has not been taking norvasc on regular basis per [facility]   Hyperlipidemia Continue crestor   Anxiety and depression Continue zoloft   Headache, chronic daily Tylenol PRN     Advance Care Planning:   Code Status: Full Code   Consults: neurosurgery: Dr. Zada Finders   DVT Prophylaxis: SCDs  Family Communication: daughter at bedside: Jessica [name]   Severity of Illness: The appropriate patient status for this patient is INPATIENT. Inpatient status is judged to be reasonable and necessary in order to provide the required intensity of service to ensure the patient's safety. The patient's presenting symptoms, physical  exam findings, and initial radiographic and laboratory data in the context of their chronic comorbidities is felt to place them at high risk for further clinical deterioration. Furthermore, it is not anticipated that the patient will be medically stable for discharge from the hospital within 2 midnights of admission.   * I certify that at the point of admission it is my clinical judgment that the patient will require inpatient hospital care spanning beyond 2 midnights from the point of admission due to high intensity of service, high risk for further deterioration and high frequency of surveillance required.*  Author: Orma Flaming, MD 08/13/2021 8:08 PM  For on call review [email or web address].

## 2021-08-13 NOTE — ED Notes (Addendum)
Per pt chart, pt was seen at [facility] yesterday and given an ambulatory referral to follow-up w/ neurology w/ Dr. Tobias Alexander in [place].

## 2021-08-13 NOTE — Consult Note (Signed)
Neurosurgery Consultation  Reason for Consult: Left lower extremity pain / weakness Referring Physician: Rogers Blocker  CC: LLE pain / weakness  HPI: This is a 66 y.o. woman that presents after a fall a few days ago, since that time she's had severe pain radiating into her proximal left leg, just past the knee, with numbness and paresthesias in the same location. She's been non-ambulatory due to the pain as well as left hip flexor and extensor weakness. No change in bowel or bladder function / control, no recent use of anti-platelet or anti-coagulant medications.   ROS: A 14 point ROS was performed and is negative except as noted in the HPI.   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 She has never used smokeless tobacco. She reports that she does not drink alcohol and does not use drugs.  Exam: Vital signs in last 24 hours: Temp:  [98 F (36.7 C)-98.6 F (37 C)] 98 F (36.7 C) (05/26 2002) Pulse Rate:  [61-81] 62 (05/26 2002) Resp:  [12-20] 17 (05/26 2002) BP: (115-136)/(51-76) 120/70 (05/26 2002) SpO2:  [93 %-99 %] 96 % (05/26 2002) Weight:  [95.3 kg] 95.3 kg (05/26 0921) General: Awake, alert, cooperative, lying in bed in NAD Head: Normocephalic and atruamatic HEENT: Neck supple Pulmonary: breathing room air comfortably, no evidence of increased work of  breathing Cardiac: RRR Abdomen: S NT ND Extremities: Warm and well perfused x4 Neuro: AOx3, PERRL, EOMI, FS Strength 5/5 x4 except 3/5 left hip flexor, 4-/5 in left knee extensors, SILTx4 except left L2 and L3 numbness   Assessment and Plan: 66 y.o. woman s/p fall with LLE pain, numbness, and weakness. MRI L-spine personally reviewed, which shows left L2-3 disc herniation that compresses the L3 root.   -discussed with the patient, given her weakness, I would recommend surgical intervention in the form of a microdiscectomy. We discussed risks, benefits, and alternatives and she'd like to proceed. OR tomorrow morning, NPO after midnight -please call with any concerns or questions  Judith Part, MD 08/13/21 10:42 PM [facility]

## 2021-08-14 ENCOUNTER — Inpatient Hospital Stay (HOSPITAL_COMMUNITY): Payer: No Typology Code available for payment source | Admitting: Certified Registered Nurse Anesthetist

## 2021-08-14 ENCOUNTER — Other Ambulatory Visit: Payer: Self-pay

## 2021-08-14 ENCOUNTER — Encounter (HOSPITAL_COMMUNITY): Payer: Self-pay | Admitting: Family Medicine

## 2021-08-14 ENCOUNTER — Encounter (HOSPITAL_COMMUNITY)
Admission: EM | Disposition: A | Discharge status: Home / Self Care | Payer: Self-pay | Source: Home / Self Care | Attending: Family Medicine

## 2021-08-14 ENCOUNTER — Inpatient Hospital Stay (HOSPITAL_COMMUNITY): Payer: No Typology Code available for payment source

## 2021-08-14 DIAGNOSIS — I10 Essential (primary) hypertension: Secondary | ICD-10-CM

## 2021-08-14 DIAGNOSIS — M5116 Intervertebral disc disorders with radiculopathy, lumbar region: Secondary | ICD-10-CM

## 2021-08-14 DIAGNOSIS — E119 Type 2 diabetes mellitus without complications: Secondary | ICD-10-CM

## 2021-08-14 DIAGNOSIS — Z794 Long term (current) use of insulin: Secondary | ICD-10-CM

## 2021-08-14 HISTORY — PX: LUMBAR LAMINECTOMY/ DECOMPRESSION WITH MET-RX: SHX5959

## 2021-08-14 LAB — GLUCOSE, CAPILLARY
Glucose-Capillary: 156 mg/dL — ABNORMAL HIGH (ref 70–99)
Glucose-Capillary: 116 mg/dL — ABNORMAL HIGH (ref 70–99)
Glucose-Capillary: 203 mg/dL — ABNORMAL HIGH (ref 70–99)
Glucose-Capillary: 202 mg/dL — ABNORMAL HIGH (ref 70–99)
Glucose-Capillary: 217 mg/dL — ABNORMAL HIGH (ref 70–99)
Glucose-Capillary: 433 mg/dL — ABNORMAL HIGH (ref 70–99)

## 2021-08-14 LAB — HIV ANTIBODY (ROUTINE TESTING W REFLEX): HIV Screen 4th Generation wRfx: NONREACTIVE

## 2021-08-14 LAB — SURGICAL PCR SCREEN
MRSA, PCR: NEGATIVE
Staphylococcus aureus: POSITIVE — AB

## 2021-08-14 IMAGING — RF DG LUMBAR SPINE 2-3V
1 series · 2 of 2 positions shown · non-contrast
Comparison: MRI lumbar spine [DATE]

CLINICAL DATA: Intraoperative radiograph for L2-3 minimally
invasive laminectomy/decompression.

EXAM:
LUMBAR SPINE - 2-3 VIEW

[Series 1: run · 2 of 2 slices shown]
[im 1/2]
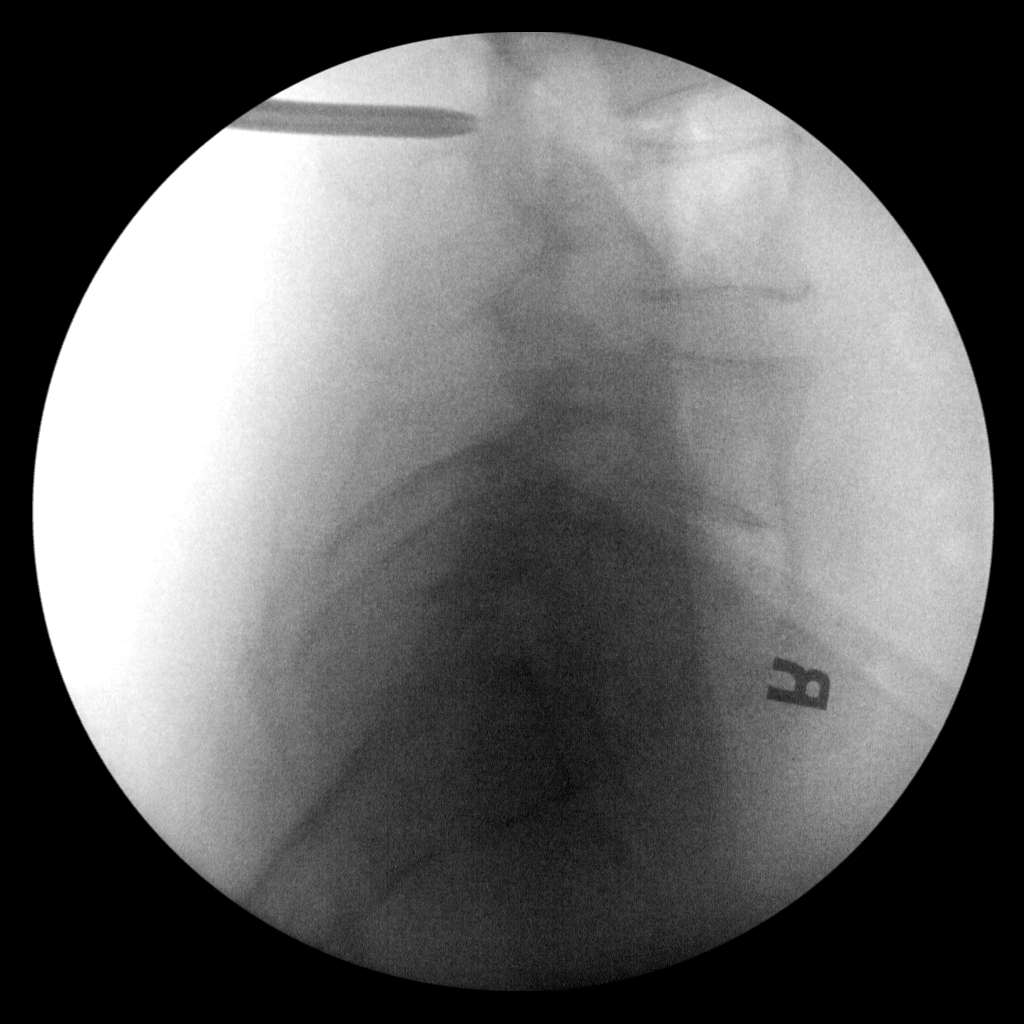
[im 2/2]
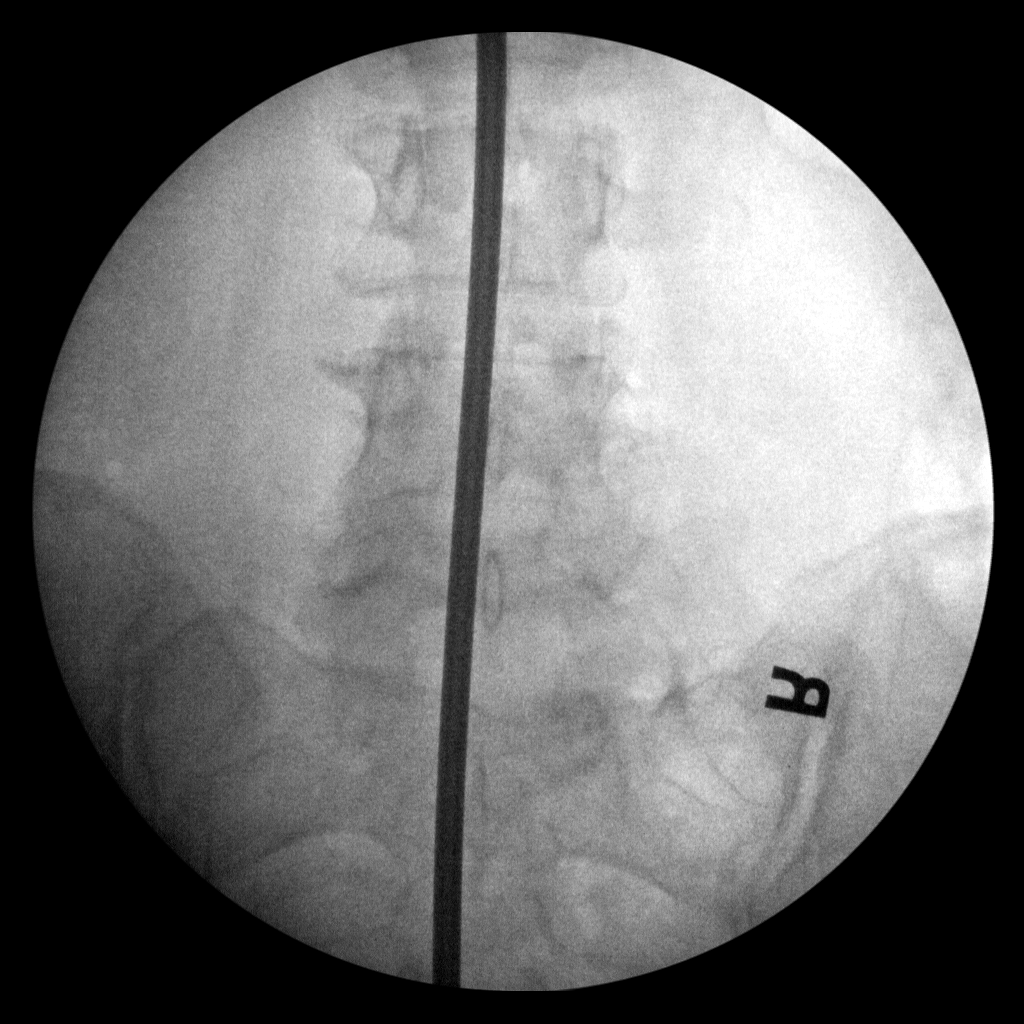

[2 of 2 positions shown; findings below may reference images not displayed]

FINDINGS: Frontal and lateral views.A surgical instrument from a posterior
approach overlies the posterior elements of the level of the L2-3
disc level.
IMPRESSION: Surgical instrument overlies the posterior elements at the L2-3 disc
level.

## 2021-08-14 SURGERY — LUMBAR LAMINECTOMY/ DECOMPRESSION WITH MET-RX
Anesthesia: General | Site: Spine Lumbar | Laterality: Left

## 2021-08-14 MED ORDER — THROMBIN 5000 UNITS EX SOLR: OROMUCOSAL | Status: DC | PRN

## 2021-08-14 MED ORDER — INSULIN ASPART 100 UNIT/ML IJ SOLN: 0.0000 [IU] | INTRAMUSCULAR | Status: DC | PRN

## 2021-08-14 MED ORDER — LIDOCAINE 2% (20 MG/ML) 5 ML SYRINGE
INTRAMUSCULAR | Status: AC
  Filled 2021-08-14: qty 5

## 2021-08-14 MED ORDER — DEXAMETHASONE SODIUM PHOSPHATE 10 MG/ML IJ SOLN
INTRAMUSCULAR | Status: DC | PRN
  Administered 2021-08-14: 5 mg via INTRAVENOUS

## 2021-08-14 MED ORDER — MIDAZOLAM HCL 2 MG/2ML IJ SOLN
INTRAMUSCULAR | Status: AC
  Filled 2021-08-14: qty 2

## 2021-08-14 MED ORDER — HYDROMORPHONE HCL 1 MG/ML IJ SOLN
INTRAMUSCULAR | Status: AC
  Filled 2021-08-14: qty 1

## 2021-08-14 MED ORDER — 0.9 % SODIUM CHLORIDE (POUR BTL) OPTIME
TOPICAL | Status: DC | PRN
  Administered 2021-08-14: 1000 mL

## 2021-08-14 MED ORDER — OXYCODONE HCL 5 MG PO TABS: 5.0000 mg | ORAL_TABLET | Freq: Once | ORAL | Status: DC | PRN

## 2021-08-14 MED ORDER — HYDROMORPHONE HCL 1 MG/ML IJ SOLN: 0.2500 mg | INTRAMUSCULAR | Status: DC | PRN

## 2021-08-14 MED ORDER — THROMBIN 5000 UNITS EX SOLR
CUTANEOUS | Status: AC
  Filled 2021-08-14: qty 5000

## 2021-08-14 MED ORDER — LIDOCAINE-EPINEPHRINE 1 %-1:100000 IJ SOLN
INTRAMUSCULAR | Status: DC | PRN
Start: 2021-08-14 — End: 2021-08-14
  Administered 2021-08-14: 10 mL

## 2021-08-14 MED ORDER — EPHEDRINE 5 MG/ML INJ
INTRAVENOUS | Status: AC
  Filled 2021-08-14: qty 5

## 2021-08-14 MED ORDER — ORAL CARE MOUTH RINSE: 15.0000 mL | Freq: Once | OROMUCOSAL | Status: AC

## 2021-08-14 MED ORDER — ROCURONIUM BROMIDE 10 MG/ML (PF) SYRINGE
PREFILLED_SYRINGE | INTRAVENOUS | Status: DC | PRN
  Administered 2021-08-14: 80 mg via INTRAVENOUS

## 2021-08-14 MED ORDER — DEXAMETHASONE SODIUM PHOSPHATE 10 MG/ML IJ SOLN
INTRAMUSCULAR | Status: AC
  Filled 2021-08-14: qty 1

## 2021-08-14 MED ORDER — FENTANYL CITRATE (PF) 250 MCG/5ML IJ SOLN
INTRAMUSCULAR | Status: AC
  Filled 2021-08-14: qty 5

## 2021-08-14 MED ORDER — INSULIN ASPART 100 UNIT/ML IJ SOLN
5.0000 [IU] | Freq: Once | INTRAMUSCULAR | Status: AC
  Administered 2021-08-14: 5 [IU] via SUBCUTANEOUS

## 2021-08-14 MED ORDER — PHENYLEPHRINE 80 MCG/ML (10ML) SYRINGE FOR IV PUSH (FOR BLOOD PRESSURE SUPPORT)
PREFILLED_SYRINGE | INTRAVENOUS | Status: DC | PRN
  Administered 2021-08-14 (×2): 80 ug via INTRAVENOUS

## 2021-08-14 MED ORDER — MIDAZOLAM HCL 2 MG/2ML IJ SOLN
INTRAMUSCULAR | Status: DC | PRN
  Administered 2021-08-14: 2 mg via INTRAVENOUS

## 2021-08-14 MED ORDER — OXYCODONE HCL 5 MG/5ML PO SOLN: 5.0000 mg | Freq: Once | ORAL | Status: DC | PRN

## 2021-08-14 MED ORDER — PROPOFOL 10 MG/ML IV BOLUS
INTRAVENOUS | Status: DC | PRN
Start: 2021-08-14 — End: 2021-08-14
  Administered 2021-08-14: 140 mg via INTRAVENOUS

## 2021-08-14 MED ORDER — CHLORHEXIDINE GLUCONATE CLOTH 2 % EX PADS
6.0000 | MEDICATED_PAD | Freq: Every day | CUTANEOUS | Status: DC
  Administered 2021-08-14 – 2021-08-15 (×2): 6 via TOPICAL

## 2021-08-14 MED ORDER — ROCURONIUM BROMIDE 10 MG/ML (PF) SYRINGE
PREFILLED_SYRINGE | INTRAVENOUS | Status: AC
  Filled 2021-08-14: qty 10

## 2021-08-14 MED ORDER — MUPIROCIN 2 % EX OINT
1.0000 "application " | TOPICAL_OINTMENT | Freq: Two times a day (BID) | CUTANEOUS | Status: DC
  Administered 2021-08-14 – 2021-08-15 (×2): 1 via NASAL
  Filled 2021-08-14: qty 22

## 2021-08-14 MED ORDER — LIDOCAINE 2% (20 MG/ML) 5 ML SYRINGE
INTRAMUSCULAR | Status: DC | PRN
  Administered 2021-08-14: 100 mg via INTRAVENOUS

## 2021-08-14 MED ORDER — PHENYLEPHRINE 80 MCG/ML (10ML) SYRINGE FOR IV PUSH (FOR BLOOD PRESSURE SUPPORT)
PREFILLED_SYRINGE | INTRAVENOUS | Status: AC
  Filled 2021-08-14: qty 10

## 2021-08-14 MED ORDER — CEFAZOLIN SODIUM-DEXTROSE 2-3 GM-%(50ML) IV SOLR
INTRAVENOUS | Status: DC | PRN
  Administered 2021-08-14: 2 g via INTRAVENOUS

## 2021-08-14 MED ORDER — ONDANSETRON HCL 4 MG/2ML IJ SOLN: 4.0000 mg | Freq: Once | INTRAMUSCULAR | Status: DC | PRN

## 2021-08-14 MED ORDER — ACETAMINOPHEN 10 MG/ML IV SOLN
INTRAVENOUS | Status: AC
  Filled 2021-08-14: qty 100

## 2021-08-14 MED ORDER — FENTANYL CITRATE (PF) 250 MCG/5ML IJ SOLN
INTRAMUSCULAR | Status: DC | PRN
  Administered 2021-08-14 (×2): 50 ug via INTRAVENOUS

## 2021-08-14 MED ORDER — ONDANSETRON HCL 4 MG/2ML IJ SOLN
INTRAMUSCULAR | Status: AC
  Filled 2021-08-14: qty 2

## 2021-08-14 MED ORDER — SUGAMMADEX SODIUM 200 MG/2ML IV SOLN
INTRAVENOUS | Status: DC | PRN
  Administered 2021-08-14: 200 mg via INTRAVENOUS

## 2021-08-14 MED ORDER — CHLORHEXIDINE GLUCONATE 0.12 % MT SOLN: 15.0000 mL | Freq: Once | OROMUCOSAL | Status: AC

## 2021-08-14 MED ORDER — ACETAMINOPHEN 10 MG/ML IV SOLN
INTRAVENOUS | Status: DC | PRN
  Administered 2021-08-14: 1000 mg via INTRAVENOUS

## 2021-08-14 MED ORDER — INSULIN ASPART 100 UNIT/ML IJ SOLN
6.0000 [IU] | Freq: Once | INTRAMUSCULAR | Status: AC
Start: 2021-08-14 — End: 2021-08-14
  Administered 2021-08-14: 6 [IU] via SUBCUTANEOUS

## 2021-08-14 MED ORDER — EPHEDRINE SULFATE-NACL 50-0.9 MG/10ML-% IV SOSY
PREFILLED_SYRINGE | INTRAVENOUS | Status: DC | PRN
  Administered 2021-08-14 (×2): 5 mg via INTRAVENOUS

## 2021-08-14 MED ORDER — LACTATED RINGERS IV SOLN: INTRAVENOUS | Status: DC

## 2021-08-14 MED ORDER — CHLORHEXIDINE GLUCONATE 0.12 % MT SOLN
OROMUCOSAL | Status: AC
  Filled 2021-08-14: qty 15

## 2021-08-14 MED ORDER — PROPOFOL 10 MG/ML IV BOLUS
INTRAVENOUS | Status: AC
  Filled 2021-08-14: qty 20

## 2021-08-14 MED ORDER — ONDANSETRON HCL 4 MG/2ML IJ SOLN
INTRAMUSCULAR | Status: DC | PRN
  Administered 2021-08-14: 4 mg via INTRAVENOUS

## 2021-08-14 MED ORDER — ARTIFICIAL TEARS OPHTHALMIC OINT
TOPICAL_OINTMENT | OPHTHALMIC | Status: AC
  Filled 2021-08-14: qty 3.5

## 2021-08-14 MED ORDER — LIDOCAINE-EPINEPHRINE 1 %-1:100000 IJ SOLN
INTRAMUSCULAR | Status: AC
  Filled 2021-08-14: qty 1

## 2021-08-14 SURGICAL SUPPLY — 48 items
BAG COUNTER SPONGE SURGICOUNT (BAG) ×2 IMPLANT
BAND RUBBER #18 3X1/16 STRL (MISCELLANEOUS) ×4 IMPLANT
BLADE CLIPPER SURG (BLADE) IMPLANT
BLADE SURG 11 STRL SS (BLADE) ×2 IMPLANT
BUR MATCHSTICK NEURO 3.0 LAGG (BURR) ×2 IMPLANT
BUR PRECISION FLUTE 5.0 (BURR) IMPLANT
CANISTER SUCT 3000ML PPV (MISCELLANEOUS) ×2 IMPLANT
DECANTER SPIKE VIAL GLASS SM (MISCELLANEOUS) ×2 IMPLANT
DERMABOND ADVANCED (GAUZE/BANDAGES/DRESSINGS) ×1
DERMABOND ADVANCED .7 DNX12 (GAUZE/BANDAGES/DRESSINGS) ×1 IMPLANT
DRAPE C-ARM 42X72 X-RAY (DRAPES) ×4 IMPLANT
DRAPE LAPAROTOMY 100X72X124 (DRAPES) ×2 IMPLANT
DRAPE MICROSCOPE LEICA (MISCELLANEOUS) ×2 IMPLANT
DRAPE SURG 17X23 STRL (DRAPES) ×2 IMPLANT
DURAPREP 26ML APPLICATOR (WOUND CARE) ×2 IMPLANT
ELECT BLADE 6.5 EXT (BLADE) ×2 IMPLANT
ELECT REM PT RETURN 9FT ADLT (ELECTROSURGICAL) ×2
ELECTRODE REM PT RTRN 9FT ADLT (ELECTROSURGICAL) ×1 IMPLANT
GAUZE 4X4 16PLY ~~LOC~~+RFID DBL (SPONGE) IMPLANT
GAUZE SPONGE 4X4 12PLY STRL (GAUZE/BANDAGES/DRESSINGS) IMPLANT
GLOVE BIOGEL PI IND STRL 7.5 (GLOVE) ×1 IMPLANT
GLOVE BIOGEL PI INDICATOR 7.5 (GLOVE) ×1
GLOVE ECLIPSE 7.5 STRL STRAW (GLOVE) ×2 IMPLANT
GLOVE EXAM NITRILE LRG STRL (GLOVE) IMPLANT
GLOVE EXAM NITRILE XL STR (GLOVE) IMPLANT
GLOVE EXAM NITRILE XS STR PU (GLOVE) IMPLANT
GOWN STRL REUS W/ TWL LRG LVL3 (GOWN DISPOSABLE) ×2 IMPLANT
GOWN STRL REUS W/ TWL XL LVL3 (GOWN DISPOSABLE) IMPLANT
GOWN STRL REUS W/TWL 2XL LVL3 (GOWN DISPOSABLE) IMPLANT
GOWN STRL REUS W/TWL LRG LVL3 (GOWN DISPOSABLE) ×4
GOWN STRL REUS W/TWL XL LVL3 (GOWN DISPOSABLE)
HEMOSTAT POWDER KIT SURGIFOAM (HEMOSTASIS) ×2 IMPLANT
KIT BASIN OR (CUSTOM PROCEDURE TRAY) ×2 IMPLANT
KIT TURNOVER KIT B (KITS) ×2 IMPLANT
NDL SPNL 18GX3.5 QUINCKE PK (NEEDLE) ×1 IMPLANT
NEEDLE HYPO 22GX1.5 SAFETY (NEEDLE) ×2 IMPLANT
NEEDLE SPNL 18GX3.5 QUINCKE PK (NEEDLE) ×2 IMPLANT
NS IRRIG 1000ML POUR BTL (IV SOLUTION) ×2 IMPLANT
PACK LAMINECTOMY NEURO (CUSTOM PROCEDURE TRAY) ×2 IMPLANT
PAD ARMBOARD 7.5X6 YLW CONV (MISCELLANEOUS) ×6 IMPLANT
SPONGE T-LAP 4X18 ~~LOC~~+RFID (SPONGE) IMPLANT
SUT MNCRL AB 3-0 PS2 18 (SUTURE) IMPLANT
SUT VIC AB 0 CT1 18XCR BRD8 (SUTURE) IMPLANT
SUT VIC AB 0 CT1 8-18 (SUTURE)
SUT VIC AB 2-0 CP2 18 (SUTURE) ×2 IMPLANT
TOWEL GREEN STERILE (TOWEL DISPOSABLE) ×2 IMPLANT
TOWEL GREEN STERILE FF (TOWEL DISPOSABLE) ×2 IMPLANT
WATER STERILE IRR 1000ML POUR (IV SOLUTION) ×2 IMPLANT

## 2021-08-14 NOTE — Progress Notes (Signed)
Neurosurgery Service Progress Note  Subjective: No acute events overnight, still having severe LLE pain / numbness / weakness, no improvement   Objective: Vitals:   08/14/21 0756 08/14/21 0800 08/14/21 1100 08/14/21 1201  BP: 116/76     Pulse: 63     Resp: 16 17  18   Temp: 98 F (36.7 C)     TempSrc: Oral     SpO2: 97% 97% 97% 96%  Weight:      Height:        Physical Exam: Strength 5/5 x4 except 3/5 left hip flexor, 4-/5 in left knee extensors, SILTx4 except left L2 and L3 numbness  Assessment & Plan: 66 y.o. woman s/p fall with LLE pain / weakness / numbness with left L2-3 disc herniation and radiculopathy.   -OR today for left L2-3 MIS discectomy  Judith Part  08/14/21 12:22 PM

## 2021-08-14 NOTE — Transfer of Care (Signed)
Immediate Anesthesia Transfer of Care Note  Patient: Jessica [name]  Procedure(s) Performed: LUMBAR TWO-THREE MINIMALLY INVASIVE LAMINECTOMY/ DECOMPRESSION (Left: Spine Lumbar)  Patient Location: PACU  Anesthesia Type:General  Level of Consciousness: awake and alert   Airway & Oxygen Therapy: Patient Spontanous Breathing and Patient connected to face mask oxygen  Post-op Assessment: Report given to RN and Post -op Vital signs reviewed and stable  Post vital signs: Reviewed and stable  Last Vitals:  Vitals Value Taken Time  BP 115/70 08/14/21 1512  Temp    Pulse 72 08/14/21 1519  Resp 13 08/14/21 1519  SpO2 95 % 08/14/21 1519  Vitals shown include unvalidated device data.  Last Pain:  Vitals:   08/14/21 1223  TempSrc: Oral  PainSc: 2       Patients Stated Pain Goal: 0 (08/14/21 1223)  Complications: No notable events documented.

## 2021-08-14 NOTE — Progress Notes (Signed)
Neurosurgery Service Post-operative progress note  Assessment & Plan: 66 y.o. woman s/p L L2-3 MIS discectomy.   -activity as tolerated, no restrictions, no brace needed -advance diet as tolerated, no restrictions -okay for DVT chemoprophylaxis on 5/29  Judith Part  08/14/21 2:53 PM

## 2021-08-14 NOTE — Progress Notes (Signed)
PROGRESS NOTE   Jessica [name]  [id number] DOB: 01-Feb-1956 DOA: 08/13/2021 PCP: Samuel Bouche, NP  Brief Narrative:   66 year old white female class III obesity BMI 42, bipolar, DM TY 2 HLD HTN migraine with aura anemia Present [facility] after working third shift 08/11/2021 and had frequent falls-she went to [place] and represented to [facility] 5/26 with 7/10 back pain lower back pain urinary incontinence saddle paresthesias or  Found to have L2-3 disc herniation by MRI and Dr. Venetia Constable saw the patient Placed on oral Norco and IV morphine for breakthrough and low-dose gabapentin  Going for surgery  Hospital-Problem based course  L2-L3 disc herniation on MRI Undergoing microdiscectomy probably this afternoon Dr. Veatrice Kells 1-2 every 4 as needed moderate, morphine 1 mg every 2 as needed severe pain  perioperative care postop instructions ambulation and weightbearing status as per neurosurgery DM TY 2 A1c 6.6 well-controlled Class III obesity BMI >42 Metformin held from admission, on SSI, CBG = 156-270 Continue Levemir 35 units as well as sensitive sliding scale Continue gabapentin 100 twice daily pain control  HTN Continue  lisinopril/HCTZ 10/12.5  DVT prophylaxis: SCD Code Status: FULL Family Communication: nad Disposition:  Status is: Inpatient Remains inpatient appropriate because:    Consultants:  [place]  Procedures:   Antimicrobials:     Subjective:  Awake coherent No distress    Objective: Vitals:   08/13/21 1700 08/13/21 2002 08/13/21 2306 08/14/21 0756  BP: (!) 131/51 120/70 (!) 113/56 116/76  Pulse: 63 62 64 63  Resp: 16 17 16 16   Temp:  98 F (36.7 C) 98.8 F (37.1 C) 98 F (36.7 C)  TempSrc:  Oral Oral Oral  SpO2: 97% 96% 94% 97%  Weight:      Height:        Intake/Output Summary (Last 24 hours) at 08/14/2021 0805 Last data filed at 08/13/2021 2243 Gross per 24 hour  Intake --  Output 450 ml  Net -450 ml   Filed Weights    08/13/21 0921  Weight: 95.3 kg    Examination:  Eomi ncat no ict pallor Cta b no added sound no rales rhonchi Abd soft nt nd no rebound no guard Has pain in the LLE with SLR passively--sensory is intact Can plantarflex.  Data Reviewed: personally reviewed   CBC    Component Value Date/Time   WBC 8.8 08/13/2021 0940   RBC 4.54 08/13/2021 0940   HGB 12.7 08/13/2021 0940   HGB 12.7 06/09/2021 0750   HCT 38.9 08/13/2021 0940   PLT 269 08/13/2021 0940   PLT 281 06/09/2021 0750   MCV 85.7 08/13/2021 0940   MCH 28.0 08/13/2021 0940   MCHC 32.6 08/13/2021 0940   RDW 14.0 08/13/2021 0940   LYMPHSABS 1.4 06/09/2021 0750   MONOABS 0.5 06/09/2021 0750   EOSABS 1.1 (H) 06/09/2021 0750   BASOSABS 0.1 06/09/2021 0750      Latest Ref Rng & Units 08/13/2021    9:40 AM 04/12/2021    8:02 AM 04/05/2021   12:00 AM  CMP  Glucose 70 - 99 mg/dL 280   94   93    BUN 8 - 23 mg/dL 12   12   13     Creatinine 0.44 - 1.00 mg/dL 0.69   0.75   0.73    Sodium 135 - 145 mmol/L 137   135   136    Potassium 3.5 - 5.1 mmol/L 3.6   3.5   3.5    Chloride 98 -  111 mmol/L 99   97   96    CO2 22 - 32 mmol/L 28   30   35    Calcium 8.9 - 10.3 mg/dL 9.1   9.9   9.7    Total Protein 6.5 - 8.1 g/dL  7.4   7.1    Total Bilirubin 0.3 - 1.2 mg/dL  0.5   0.7    Alkaline Phos 38 - 126 U/L  82     AST 15 - 41 U/L  16   15    ALT 0 - 44 U/L  14   15       Radiology Studies: MR LUMBAR SPINE WO CONTRAST  Result Date: 08/13/2021 CLINICAL DATA:  Low back pain, increased fracture risk disc protrusion EXAM: MRI LUMBAR SPINE WITHOUT CONTRAST TECHNIQUE: Multiplanar, multisequence MR imaging of the lumbar spine was performed. No intravenous contrast was administered. COMPARISON:  None Available. FINDINGS: Segmentation: Standard segmentation is a soon. The inferior-most fully formed intervertebral disc labeled L5-S1. Alignment: Grade 1 retrolisthesis of L1 on L2 and L2 on L3. Grade 1 anterolisthesis of L4 on L5. Vertebrae:  Vertebral body heights are maintained. No specific evidence of acute fracture or discitis/osteomyelitis. No suspicious bone lesions. Conus medullaris and cauda equina: Conus extends to the T12-L1 level. Conus appears normal. Paraspinal and other soft tissues: Unremarkable. Disc levels: T12-L1: Tiny central disc protrusion without significant stenosis. L1-L2: Mild disc bulging without significant stenosis. Bilateral facet arthropathy with small facet joint effusions. L2-L3: Broad disc bulge with superimposed left subarticular/foraminal disc protrusion which contacts, potentially affecting the descending left L3 nerve roots. Mild left foraminal stenosis. Mild central canal stenosis. L3-L4: Left foraminal disc protrusion without significant canal or foraminal stenosis. L4-L5: Broad disc bulging with superimposed small right paracentral disc protrusion. No significant canal or foraminal stenosis. L5-S1: Bilateral facet arthropathy without significant stenosis. IMPRESSION: 1. At L2-L3, left subarticular/foraminal disc protrusion with moderate stenosis of the left subarticular recess, potentially affecting the descending left L3 nerve roots. Mild left foraminal stenosis at this level. 2. Additional milder multilevel degenerative changes detailed above. Electronically Signed   By: Margaretha Sheffield M.D.   On: 08/13/2021 18:31     Scheduled Meds:  gabapentin  100 mg Oral BID   lisinopril  10 mg Oral Daily   And   hydrochlorothiazide  12.5 mg Oral Daily   insulin aspart  0-9 Units Subcutaneous TID WC   insulin glargine-yfgn  35 Units Subcutaneous QHS   rosuvastatin  40 mg Oral Daily   sertraline  100 mg Oral Daily   sodium chloride flush  3 mL Intravenous Q12H   Continuous Infusions:  sodium chloride       LOS: 1 day   Time spent: [place], MD [facility] To contact the attending provider between 7A-7P or the covering provider during after hours 7P-7A, please log into the web  site [email or web address] and access using universal [place] password for that web site. If you do not have the password, please call the hospital operator.  08/14/2021, 8:05 AM

## 2021-08-14 NOTE — Progress Notes (Signed)
Patient aware to be NPO for procedure this AM. And has been NPO since MD. Consent signed /placed in chart. Pre-op check list  completed. Awaiting on transport to OR.

## 2021-08-14 NOTE — Progress Notes (Signed)
Pt transferred to OR, verbal report given to Launa Flight, RN.

## 2021-08-14 NOTE — Progress Notes (Signed)
PT Cancellation Note  Patient Details Name: Jessica [name] MRN: [id number] DOB: Sep 01, 1955   Cancelled Treatment:    Reason Eval/Treat Not Completed: Patient not medically ready. Pt for OR for back surgery today. Will follow up post-op.    Angelina Ok [facility] 08/14/2021, 11:37 AM Skip Mayer PT Acute Rehabilitation Services Office [phone]

## 2021-08-14 NOTE — Op Note (Signed)
PATIENT: Jessica [name]  DAY OF SURGERY: 08/14/21   PRE-OPERATIVE DIAGNOSIS:  Herniated nucleus pulposus with lumbar radiculopathy   POST-OPERATIVE DIAGNOSIS:  Herniated nucleus pulposus with lumbar radiculopathy   PROCEDURE:  Left minimally invasive L2-3 discectomy   SURGEON:  Surgeon(s) and Role:    Jadene Pierini, MD - Primary   ANESTHESIA: ETGA   BRIEF HISTORY: This is a 66 year old woman who presented after a fall with new severe LLE pain with weakness in the left leg. MRI showed a corresponding disc herniation and and nerve root compression. Given the acute weakness, I recommended a minimally invasive L2-3 discectomy. This was discussed with the patient as well as risks, benefits, and alternatives and the patient wished to proceed with surgical treatment.    OPERATIVE DETAIL: The patient was taken to the operating room and placed on the OR table in the prone position. A formal time out was performed with two patient identifiers and confirmed the operative site. Anesthesia was induced by the anesthesia team. The operative site was marked, hair was clipped with surgical clippers, the area was then prepped and draped in a sterile fashion. Fluoroscopy was used to identify the surgical level prior to incision.   A 2cm incision was then marked 1cm off to the left of midline. The fascia was incised sharply and serial dilators were docked to the lamino-facet junction using fluoroscopy to confirm position as well as perform a second count to confirm the correct surgical level. After a final dilator was placed, a tubular retractor was placed over this and secured to the table. The operating microscope was draped and brought into the field. Anatomy was palpated and confirmed, monopolar cautery was used to expose the facet, lamina, and a portion of the spinous process to confirm orientation. A high speed drill and kerrison rongeurs were then used to create a hemilaminotomy and small medial  facetectomy. The ligamentum flavum was resected and the thecal sac and traversing nerve root were identified. The traversing nerve root was gently retracted medially to expose the disc space using a suction-retractor. A disc herniation was clearly present. There was significant calcification of the fragment and it was quite difficult to dissect free. With time, the large, free disc fragments were removed. The annulotomy was explored and additional free fragments were removed. The traversing nerve root was palpated throughout its visible course to confirm good decompression.   Hemostasis was obtained, the wound was copiously irrigated, and the tube was removed while using the microscope to confirm hemostasis of the muscle edges. All instrument and sponge counts were correct and the incision was then closed in layers. The patient was then returned to anesthesia for emergence. No apparent complications at the completion of the procedure.    EBL:  64mL   DRAINS: none   SPECIMENS: none   Jadene Pierini, MD 08/14/21 12:26 PM

## 2021-08-14 NOTE — Anesthesia Preprocedure Evaluation (Signed)
Anesthesia Evaluation  Patient identified by MRN, date of birth, ID band Patient awake    Reviewed: Allergy & Precautions, NPO status , Patient's Chart, lab work & pertinent test results  Airway Mallampati: II  TM Distance: <3 FB Neck ROM: Full    Dental no notable dental hx.    Pulmonary neg pulmonary ROS,    Pulmonary exam normal breath sounds clear to auscultation       Cardiovascular hypertension, Normal cardiovascular exam Rhythm:Regular Rate:Normal     Neuro/Psych Anxiety Depression negative neurological ROS     GI/Hepatic negative GI ROS, Neg liver ROS,   Endo/Other  diabetes, Type 2, Insulin DependentMorbid obesity  Renal/GU negative Renal ROS  negative genitourinary   Musculoskeletal negative musculoskeletal ROS (+)   Abdominal   Peds negative pediatric ROS (+)  Hematology negative hematology ROS (+)   Anesthesia Other Findings   Reproductive/Obstetrics negative OB ROS                             Anesthesia Physical Anesthesia Plan  ASA: 3  Anesthesia Plan: General   Post-op Pain Management: Tylenol PO (pre-op)*   Induction: Intravenous  PONV Risk Score and Plan: 3 and Ondansetron, Dexamethasone, Midazolam and Treatment may vary due to age or medical condition  Airway Management Planned: Oral ETT  Additional Equipment:   Intra-op Plan:   Post-operative Plan: Extubation in OR  Informed Consent: I have reviewed the patients History and Physical, chart, labs and discussed the procedure including the risks, benefits and alternatives for the proposed anesthesia with the patient or authorized representative who has indicated his/her understanding and acceptance.     Dental advisory given  Plan Discussed with: CRNA and Surgeon  Anesthesia Plan Comments:         Anesthesia Quick Evaluation

## 2021-08-14 NOTE — Anesthesia Postprocedure Evaluation (Signed)
Anesthesia Post Note  Patient: [name]  Procedure([name]) Performed: LUMBAR TWO-THREE MINIMALLY INVASIVE LAMINECTOMY/ DECOMPRESSION (Left: Spine Lumbar)     Patient location during evaluation: PACU Anesthesia Type: General Level of consciousness: awake and alert Pain management: pain level controlled Vital Signs Assessment: post-procedure vital signs reviewed and stable Respiratory status: spontaneous breathing, nonlabored ventilation, respiratory function stable and patient connected to nasal cannula oxygen Cardiovascular status: blood pressure returned to baseline and stable Postop Assessment: no apparent nausea or vomiting Anesthetic complications: no   No notable events documented.  Last Vitals:  Vitals:   08/14/21 1530 08/14/21 1545  BP: (!) 124/53 (!) 122/54  Pulse: 70 69  Resp: 17 17  Temp:    SpO2: 96% 96%    Last Pain:  Vitals:   08/14/21 1545  TempSrc:   PainSc: 0-No pain    LLE Motor Response: Purposeful movement;Responds to commands (08/14/21 1545) LLE Sensation: Full sensation;No numbness;No tingling (08/14/21 1545) RLE Motor Response: Purposeful movement;Responds to commands (08/14/21 1545) RLE Sensation: Full sensation;No numbness;No tingling (08/14/21 1545)      Jessica [name]

## 2021-08-14 NOTE — Anesthesia Procedure Notes (Signed)
Procedure Name: Intubation Date/Time: 08/14/2021 12:28 PM Performed by: Reece Agar, CRNA Pre-anesthesia Checklist: Patient identified, Emergency Drugs available, Suction available and Patient being monitored Patient Re-evaluated:Patient Re-evaluated prior to induction Oxygen Delivery Method: Circle System Utilized Preoxygenation: Pre-oxygenation with 100% oxygen Induction Type: IV induction Ventilation: Mask ventilation without difficulty Laryngoscope Size: Mac and 3 Grade View: Grade I Tube type: Oral Tube size: 7.0 mm Number of attempts: 1 Airway Equipment and Method: Stylet Placement Confirmation: ETT inserted through vocal cords under direct vision, positive ETCO2 and breath sounds checked- equal and bilateral Secured at: 22 cm Tube secured with: Tape Dental Injury: Teeth and Oropharynx as per pre-operative assessment

## 2021-08-15 ENCOUNTER — Encounter (HOSPITAL_COMMUNITY): Payer: Self-pay | Admitting: Neurological Surgery

## 2021-08-15 LAB — GLUCOSE, CAPILLARY
Glucose-Capillary: 233 mg/dL — ABNORMAL HIGH (ref 70–99)
Glucose-Capillary: 225 mg/dL — ABNORMAL HIGH (ref 70–99)
Glucose-Capillary: 348 mg/dL — ABNORMAL HIGH (ref 70–99)

## 2021-08-15 LAB — CBC WITH DIFFERENTIAL/PLATELET
Abs Immature Granulocytes: 0.09 10*3/uL — ABNORMAL HIGH (ref 0.00–0.07)
Basophils Absolute: 0 10*3/uL (ref 0.0–0.1)
Basophils Relative: 0 %
Eosinophils Absolute: 0 10*3/uL (ref 0.0–0.5)
Eosinophils Relative: 0 %
HCT: 36.6 % (ref 36.0–46.0)
Hemoglobin: 12.2 g/dL (ref 12.0–15.0)
Immature Granulocytes: 1 %
Lymphocytes Relative: 3 %
Lymphs Abs: 0.4 10*3/uL — ABNORMAL LOW (ref 0.7–4.0)
MCH: 28.4 pg (ref 26.0–34.0)
MCHC: 33.3 g/dL (ref 30.0–36.0)
MCV: 85.1 fL (ref 80.0–100.0)
Monocytes Absolute: 0.5 10*3/uL (ref 0.1–1.0)
Monocytes Relative: 3 %
Neutro Abs: 14.2 10*3/uL — ABNORMAL HIGH (ref 1.7–7.7)
Neutrophils Relative %: 93 %
Platelets: 240 10*3/uL (ref 150–400)
RBC: 4.3 MIL/uL (ref 3.87–5.11)
RDW: 13.7 % (ref 11.5–15.5)
WBC: 15.3 10*3/uL — ABNORMAL HIGH (ref 4.0–10.5)
nRBC: 0 % (ref 0.0–0.2)

## 2021-08-15 LAB — BASIC METABOLIC PANEL
Anion gap: 10 (ref 5–15)
BUN: 17 mg/dL (ref 8–23)
CO2: 23 mmol/L (ref 22–32)
Calcium: 8.6 mg/dL — ABNORMAL LOW (ref 8.9–10.3)
Chloride: 100 mmol/L (ref 98–111)
Creatinine, Ser: 0.86 mg/dL (ref 0.44–1.00)
GFR, Estimated: >60 mL/min (ref >60)
Glucose, Bld: 309 mg/dL — ABNORMAL HIGH (ref 70–99)
Potassium: 4.1 mmol/L (ref 3.5–5.1)
Sodium: 133 mmol/L — ABNORMAL LOW (ref 135–145)

## 2021-08-15 MED ORDER — ACETAMINOPHEN 500 MG PO TABS: 1000.0000 mg | ORAL_TABLET | Freq: Once | ORAL | Status: DC

## 2021-08-15 MED ORDER — GABAPENTIN 100 MG PO CAPS: 100.0000 mg | ORAL_CAPSULE | Freq: Two times a day (BID) | ORAL | 0 refills | Status: DC

## 2021-08-15 MED ORDER — METHOCARBAMOL 500 MG PO TABS
750.0000 mg | ORAL_TABLET | Freq: Four times a day (QID) | ORAL | 2 refills | Status: DC
Start: 2021-08-15 — End: 2021-12-23

## 2021-08-15 MED ORDER — HYDROCODONE-ACETAMINOPHEN 5-325 MG PO TABS: 1.0000 | ORAL_TABLET | ORAL | 0 refills | Status: DC | PRN

## 2021-08-15 NOTE — Discharge Instructions (Signed)
Wound Care Leave incision open to air. You may shower. Do not scrub directly on incision.  Do not put any creams, lotions, or ointments on incision. Activity Walk each and every day, increasing distance each day. No lifting greater than 5 lbs.  Avoid bending, arching, and twisting. No driving for 2 weeks; may ride as a passenger locally. If provided with back brace, wear when out of bed.  It is not necessary to wear in bed. Diet Resume your normal diet.  Return to Work Will be discussed at you follow up appointment. Call Your Doctor If Any of These Occur Redness, drainage, or swelling at the wound.  Temperature greater than 101 degrees. Severe pain not relieved by pain medication. Incision starts to come apart. Follow Up Appt Call today for appointment in 2 weeks ([phone]) or for problems.  If you have any hardware placed in your spine, you will need an x-ray before your appointment. 

## 2021-08-15 NOTE — TOC Transition Note (Addendum)
Transition of Care [facility]) - CM/SW Discharge Note   Patient Details  Name: Rhaya Buening MRN: [id number] Date of Birth: 01-26-1956  Transition of Care [facility]) CM/SW Contact:  Verdell Carmine, RN Phone Number: 08/15/2021, 2:41 PM   Clinical Narrative:      Patient was not recommended PT follow up however MD ordered 3:1.  Spoke to patient regarding any other needs. She would like a tub bench Called [facility] to bring up  both as paient is discharging  today no other needs identified.  She will follow up with providers.     Patient Goals and CMS Choice        Discharge Placement             Home Self Care With DME          Discharge Plan and Services                DME Arranged: 3-N-1 DME Agency: [employer] Date DME Agency Contacted: 08/15/21 Time DME Agency Contacted: 1440 Representative spoke with at DME Agency: [place]            Social Determinants of Health ([place]) Interventions     Readmission Risk Interventions     View : No data to display.

## 2021-08-15 NOTE — TOC Progression Note (Signed)
Transition of Care [facility]) - Progression Note    Patient Details  Name: Jessica [name] MRN: [id number] Date of Birth: October 08, 1955  [facility]) CM/SW Contact  Lawerance Sabal, RN Phone Number: 08/15/2021, 2:49 PM  Clinical Narrative:    Spoke w patient, and she would like 3/1, decline tub bench will get on e at [employer] or [place]. 3/1 ordered and will come to room within the hour.          Expected Discharge Plan and Services           Expected Discharge Date: 08/15/21               DME Arranged: Tub bench DME Agency: [employer] Date DME Agency Contacted: 08/15/21 Time DME Agency Contacted: 1440 Representative spoke with at DME Agency: [place]             Social Determinants of Health (SDOH) Interventions    Readmission Risk Interventions     View : No data to display.

## 2021-08-15 NOTE — Evaluation (Signed)
Physical Therapy Evaluation and Discharge Patient Details Name: Jessica [name] MRN: [id number] DOB: Aug 10, 1955 Today's Date: 08/15/2021  History of Present Illness  66 y.o. female presented to ED 08/13/21 with left leg weakness. s/p MRI at [facility] with L2-3 disc protrusion and sent her home. +fall at home. 08/14/21 underwent minimally invasive L2-3 discectomy PMH significant of anxiety and depression, T2DM, HLD, HTN, migraines with aura, anemia with complaints of worsening mobility, decreased  Clinical Impression   Patient evaluated by Physical Therapy with no further acute PT needs identified. She will benefit from OT evaluation for ADLs and bathroom DME recommendations. (Have notified OT to try to see today as able). All education has been completed and the patient has no further questions.  See below for any follow-up Physical Therapy or equipment needs. PT is signing off. Thank you for this referral.        Recommendations for follow up therapy are one component of a multi-disciplinary discharge planning process, led by the attending physician.  Recommendations may be updated based on patient status, additional functional criteria and insurance authorization.  Follow Up Recommendations No PT follow up    Assistance Recommended at Discharge Set up Supervision/Assistance  Patient can return home with the following  Help with stairs or ramp for entrance    Equipment Recommendations BSC/3in1  Recommendations for Other Services  OT consult    Functional Status Assessment Patient has had a recent decline in their functional status and demonstrates the ability to make significant improvements in function in a reasonable and predictable amount of time.     Precautions / Restrictions Precautions Precautions: None Precaution Comments: MD order no back precautions/restrictions      Mobility  Bed Mobility Overal bed mobility: Modified Independent             General bed  mobility comments: educated in rolling onto side and side to sit; reverse for returning to supine; pt using LUE to assist LLE and rotating from side toward her back as lifing leg    Transfers Overall transfer level: Needs assistance Equipment used: Rolling walker (2 wheels) Transfers: Sit to/from Stand Sit to Stand: Supervision           General transfer comment: vc for proper use of RW; 2nd attempt modified independent    Ambulation/Gait Ambulation/Gait assistance: Min guard, Supervision Gait Distance (Feet): 170 Feet Assistive device: Rolling walker (2 wheels) Gait Pattern/deviations: Step-through pattern, Decreased stride length Gait velocity: decr     General Gait Details: pt able to feel left knee begin to buckle twice during gait (not detectable by PT as pt using UEs on RW to control herself)  Stairs Stairs: Yes   Stair Management: Forwards, With walker Number of Stairs: 1 General stair comments: demonstrated with pt return verbalizing how to ascend/descend one step with RW  Wheelchair Mobility    Modified Rankin (Stroke Patients Only)       Balance                                             Pertinent Vitals/Pain Pain Assessment Pain Assessment: Faces Faces Pain Scale: Hurts little more Pain Location: back Pain Descriptors / Indicators: Aching, Discomfort Pain Intervention(s): Limited activity within patient's tolerance, Monitored during session, Repositioned    Home Living Family/patient expects to be discharged to:: Private residence Living Arrangements: Alone Available Help at Discharge:  Family;Available 24 hours/day (daughter, for several days if needed) Type of Home: Apartment Home Access: Stairs to enter Entrance Stairs-Rails: None Entrance Stairs-Number of Steps: 1   Home Layout: One level Home Equipment: Agricultural consultant (2 wheels)      Prior Function Prior Level of Function : Independent/Modified Independent              Mobility Comments: used RW after rt knee injury; PTA had difficulty getting off toilet and had to call EMS       Hand Dominance        Extremity/Trunk Assessment   Upper Extremity Assessment Upper Extremity Assessment: Defer to OT evaluation    Lower Extremity Assessment Lower Extremity Assessment: RLE deficits/detail;LLE deficits/detail RLE Deficits / Details: grossly 4/5 knee extension (min resistance provided) LLE Deficits / Details: ankle DF 5/5, knee extension 3+/5 LLE Sensation: decreased light touch    Cervical / Trunk Assessment Cervical / Trunk Assessment: Other exceptions;Back Surgery Cervical / Trunk Exceptions: obese  Communication   Communication: No difficulties  Cognition Arousal/Alertness: Awake/alert Behavior During Therapy: WFL for tasks assessed/performed Overall Cognitive Status: Within Functional Limits for tasks assessed                                          General Comments General comments (skin integrity, edema, etc.): pt educated she does not have back precautions, but avoiding bending and twisting will decrease her pain    Exercises     Assessment/Plan    PT Assessment Patient does not need any further PT services  PT Problem List         PT Treatment Interventions      PT Goals (Current goals can be found in the Care Plan section)  Acute Rehab PT Goals PT Goal Formulation: All assessment and education complete, DC therapy    Frequency       Co-evaluation               AM-PAC PT "6 Clicks" Mobility  Outcome Measure Help needed turning from your back to your side while in a flat bed without using bedrails?: A Little Help needed moving from lying on your back to sitting on the side of a flat bed without using bedrails?: A Little Help needed moving to and from a bed to a chair (including a wheelchair)?: A Little Help needed standing up from a chair using your arms (e.g., wheelchair or bedside  chair)?: A Little Help needed to walk in hospital room?: A Little Help needed climbing 3-5 steps with a railing? : A Little 6 Click Score: 18    End of Session   Activity Tolerance: Patient tolerated treatment well Patient left: in bed;with call bell/phone within reach;with bed alarm set Nurse Communication: Mobility status PT Visit Diagnosis: Other abnormalities of gait and mobility (R26.89)    Time: 6433-2951 PT Time Calculation (min) (ACUTE ONLY): 30 min   Charges:   PT Evaluation $PT Eval Low Complexity: 1 Low PT Treatments $Gait Training: 8-22 mins         Jerolyn Center, PT Acute Rehabilitation Services  Pager [phone] Office [phone]   Zena Amos 08/15/2021, 1:56 PM

## 2021-08-15 NOTE — Plan of Care (Signed)
Pt  admitted for surgical repair for Lumbar Disc Herniation with Radiculopathy.  Procedure completed with no complications reported. Has participated in care plan and therapies. All personal belongings packed by daughter and placed at bedside. DME equipment delivered to room and at bedside prior to discharge home. IV site to right forearm with no s/s of infection or drainage, IV removed with no bleeding, redness noted and tip of catheter intact, patient tolerated procedure well. Discharged  home with daughter at her side via personal car.

## 2021-08-15 NOTE — Evaluation (Signed)
Occupational Therapy Evaluation Patient Details Name: Jessica [name] MRN: [id number] DOB: [birth year] Today's Date: 08/15/2021   History of Present Illness 66 y.o. female presented to ED 08/13/21 with left leg weakness. s/p MRI at [facility] with L2-3 disc protrusion and sent her home. +fall at home. 08/14/21 underwent minimally invasive L2-3 discectomy PMH significant of anxiety and depression, T2DM, HLD, HTN, migraines with aura, anemia with complaints of worsening mobility, decreased   Clinical Impression   Pt admitted for concerns listed above. PTA pt reported that she was fairly independent, until recently where she was having increased weakness and pain limiting her mobility. At this time, pt presents near her baseline, able to complete ADL's with compensatory strategies. Provided education on tub transfers for safety, as well as DME that can assist safe transfers and ADL performance. Pt has no further skilled OT needs and acute OT will sign off.       Recommendations for follow up therapy are one component of a multi-disciplinary discharge planning process, led by the attending physician.  Recommendations may be updated based on patient status, additional functional criteria and insurance authorization.   Follow Up Recommendations  No OT follow up    Assistance Recommended at Discharge Set up Supervision/Assistance  Patient can return home with the following A little help with walking and/or transfers;A little help with bathing/dressing/bathroom;Assistance with cooking/housework    Functional Status Assessment  Patient has had a recent decline in their functional status and demonstrates the ability to make significant improvements in function in a reasonable and predictable amount of time.  Equipment Recommendations  BSC/3in1    Recommendations for Other Services       Precautions / Restrictions Precautions Precautions: None Precaution Comments: MD order no back  precautions/restrictions Restrictions Weight Bearing Restrictions: No      Mobility Bed Mobility Overal bed mobility: Modified Independent             General bed mobility comments: educated in rolling onto side and side to sit; reverse for returning to supine; pt using LUE to assist LLE and rotating from side toward her back as lifing leg    Transfers Overall transfer level: Needs assistance Equipment used: Rolling walker (2 wheels) Transfers: Sit to/from Stand Sit to Stand: Supervision                  Balance                                           ADL either performed or assessed with clinical judgement   ADL Overall ADL's : Needs assistance/impaired Eating/Feeding: Independent;Sitting   Grooming: Supervision/safety;Standing   Upper Body Bathing: Independent;Sitting   Lower Body Bathing: Min guard;Sitting/lateral leans   Upper Body Dressing : Independent;Sitting   Lower Body Dressing: Minimal assistance;Sit to/from stand;Sitting/lateral leans   Toilet Transfer: Min guard;BSC/3in1;Ambulation   Toileting- Clothing Manipulation and Hygiene: Min guard;Sit to/from stand;Sitting/lateral lean   Tub/ Shower Transfer: Minimal assistance;Tub transfer   Functional mobility during ADLs: Supervision/safety;Rolling walker (2 wheels) General ADL Comments: Pt needing assist due to LE weakness/knee buckling     Vision Baseline Vision/History: 1 Wears glasses Ability to See in Adequate Light: 0 Adequate Patient Visual Report: No change from baseline Vision Assessment?: No apparent visual deficits     Perception     Praxis      Pertinent Vitals/Pain Pain Assessment Pain Assessment:  Faces Faces Pain Scale: Hurts a little bit Pain Location: back Pain Descriptors / Indicators: Aching, Discomfort Pain Intervention(s): Premedicated before session, Monitored during session, Repositioned     Hand Dominance Right   Extremity/Trunk  Assessment Upper Extremity Assessment Upper Extremity Assessment: Overall WFL for tasks assessed   Lower Extremity Assessment Lower Extremity Assessment: Defer to PT evaluation RLE Deficits / Details: grossly 4/5 knee extension (min resistance provided) LLE Deficits / Details: ankle DF 5/5, knee extension 3+/5 LLE Sensation: decreased light touch   Cervical / Trunk Assessment Cervical / Trunk Assessment: Back Surgery Cervical / Trunk Exceptions: obese   Communication Communication Communication: No difficulties   Cognition Arousal/Alertness: Awake/alert Behavior During Therapy: WFL for tasks assessed/performed Overall Cognitive Status: Within Functional Limits for tasks assessed                                       General Comments  Educated on compensatory techniques and DME options    Exercises     Shoulder Instructions      Home Living Family/patient expects to be discharged to:: Private residence Living Arrangements: Alone Available Help at Discharge: Family;Available 24 hours/day (daughter, for several days if needed) Type of Home: Apartment Home Access: Stairs to enter Entrance Stairs-Number of Steps: 1 Entrance Stairs-Rails: None Home Layout: One level     Bathroom Shower/Tub: Chief Strategy Officer: Standard Bathroom Accessibility: Yes   Home Equipment: Agricultural consultant (2 wheels)          Prior Functioning/Environment Prior Level of Function : Independent/Modified Independent             Mobility Comments: used RW after rt knee injury; PTA had difficulty getting off toilet and had to call EMS          OT Problem List: Decreased strength;Decreased activity tolerance;Impaired balance (sitting and/or standing);Decreased knowledge of use of DME or AE      OT Treatment/Interventions:      OT Goals(Current goals can be found in the care plan section) Acute Rehab OT Goals Patient Stated Goal: To get stronger OT Goal  Formulation: With patient Time For Goal Achievement: 08/15/21 Potential to Achieve Goals: Good  OT Frequency:      Co-evaluation              AM-PAC OT "6 Clicks" Daily Activity     Outcome Measure Help from another person eating meals?: None Help from another person taking care of personal grooming?: A Little Help from another person toileting, which includes using toliet, bedpan, or urinal?: A Little Help from another person bathing (including washing, rinsing, drying)?: A Little Help from another person to put on and taking off regular upper body clothing?: None Help from another person to put on and taking off regular lower body clothing?: A Little 6 Click Score: 20   End of Session Equipment Utilized During Treatment: Gait belt;Rolling walker (2 wheels) Nurse Communication: Mobility status  Activity Tolerance: Patient tolerated treatment well Patient left: in bed;with call bell/phone within reach  OT Visit Diagnosis: Unsteadiness on feet (R26.81);Other abnormalities of gait and mobility (R26.89);Muscle weakness (generalized) (M62.81)                Time: 1451-1520 OT Time Calculation (min): 29 min Charges:  OT General Charges $OT Visit: 1 Visit OT Evaluation $OT Eval Moderate Complexity: 1 Mod OT Treatments $Self Care/Home Management : 8-22  mins  Jessica Nylen H., OTR/L Acute Rehabilitation  Libia Fazzini Elane Bing Plume 08/15/2021, 4:04 PM

## 2021-08-15 NOTE — Progress Notes (Signed)
Subjective: Patient reports incisional pain and pressure that appears appropriate. Preop symptoms are improving. No other complaints. No acute events overnight.   Objective: Vital signs in last 24 hours: Temp:  [97.4 F (36.3 C)-98.1 F (36.7 C)] 98.1 F (36.7 C) (05/28 0749) Pulse Rate:  [62-80] 69 (05/28 0749) Resp:  [14-18] 16 (05/28 0749) BP: (98-145)/(50-70) 112/52 (05/28 0749) SpO2:  [93 %-100 %] 97 % (05/28 0749)  Intake/Output from previous day: 05/27 0701 - 05/28 0700 In: 853 [I.V.:703; IV Piggyback:150] Out: 100 [Blood:100] Intake/Output this shift: No intake/output data recorded.  Physical Exam: Patient is awake, A/O X 4, conversant, and in good spirits. Eyes open spontaneously. They are in NAD and VSS. Doing well. Speech is fluent and appropriate. MAE. BUE 5/5 throughout, BLE 5/5 throughout   . Sensation to light touch is intact except for left L2 and L3 distribution which has improved since surgery. PERLA, EOMI. CNs grossly intact. Dressing is clean dry intact. Incision is well approximated with no drainage, erythema, or edema.    Lab Results: Recent Labs    08/13/21 0940 08/15/21 0229  WBC 8.8 15.3*  HGB 12.7 12.2  HCT 38.9 36.6  PLT 269 240   BMET Recent Labs    08/13/21 0940 08/15/21 0229  NA 137 133*  K 3.6 4.1  CL 99 100  CO2 28 23  GLUCOSE 280* 309*  BUN 12 17  CREATININE 0.69 0.86  CALCIUM 9.1 8.6*    Studies/Results: DG Lumbar Spine 2-3 Views  Result Date: 08/14/2021 CLINICAL DATA:  Intraoperative radiograph for L2-3 minimally invasive laminectomy/decompression. EXAM: LUMBAR SPINE - 2-3 VIEW COMPARISON:  MRI lumbar spine 08/13/2021 FINDINGS: Frontal and lateral views.A surgical instrument from a posterior approach overlies the posterior elements of the level of the L2-3 disc level. IMPRESSION: Surgical instrument overlies the posterior elements at the L2-3 disc level. Electronically Signed   By: Yvonne Kendall M.D.   On: 08/14/2021 15:37   MR  LUMBAR SPINE WO CONTRAST  Result Date: 08/13/2021 CLINICAL DATA:  Low back pain, increased fracture risk disc protrusion EXAM: MRI LUMBAR SPINE WITHOUT CONTRAST TECHNIQUE: Multiplanar, multisequence MR imaging of the lumbar spine was performed. No intravenous contrast was administered. COMPARISON:  None Available. FINDINGS: Segmentation: Standard segmentation is a soon. The inferior-most fully formed intervertebral disc labeled L5-S1. Alignment: Grade 1 retrolisthesis of L1 on L2 and L2 on L3. Grade 1 anterolisthesis of L4 on L5. Vertebrae: Vertebral body heights are maintained. No specific evidence of acute fracture or discitis/osteomyelitis. No suspicious bone lesions. Conus medullaris and cauda equina: Conus extends to the T12-L1 level. Conus appears normal. Paraspinal and other soft tissues: Unremarkable. Disc levels: T12-L1: Tiny central disc protrusion without significant stenosis. L1-L2: Mild disc bulging without significant stenosis. Bilateral facet arthropathy with small facet joint effusions. L2-L3: Broad disc bulge with superimposed left subarticular/foraminal disc protrusion which contacts, potentially affecting the descending left L3 nerve roots. Mild left foraminal stenosis. Mild central canal stenosis. L3-L4: Left foraminal disc protrusion without significant canal or foraminal stenosis. L4-L5: Broad disc bulging with superimposed small right paracentral disc protrusion. No significant canal or foraminal stenosis. L5-S1: Bilateral facet arthropathy without significant stenosis. IMPRESSION: 1. At L2-L3, left subarticular/foraminal disc protrusion with moderate stenosis of the left subarticular recess, potentially affecting the descending left L3 nerve roots. Mild left foraminal stenosis at this level. 2. Additional milder multilevel degenerative changes detailed above. Electronically Signed   By: Margaretha Sheffield M.D.   On: 08/13/2021 18:31   DG C-Arm 1-60  Min-No Report  Result Date:  08/14/2021 Fluoroscopy was utilized by the requesting physician.  No radiographic interpretation.    Assessment/Plan: 66 y.o. female who is POD#1 s/p L L2/3 MIS discectomy. She is recovering well and reports a significant reduction of her preoperative symptoms. She has appropriate incisional pain. LLE weakness persists. She is awaiting PT/OT evaluation. Continue working on pain control, mobility and ambulating patient. Will plan for discharge either today or tomorrow. She reports having little assistance at home. She might benefit from staying one more night and plan for discharge tomorrow but we will wait for PT/OT input prior to making a decision.    -PT/OT -activity as tolerated, no restrictions, no brace needed -advance diet as tolerated, no restrictions -Will plan to begin DVT chemoprophylaxis on 5/29     LOS: 2 days    Marvis Moeller, DNP, AGNP-C Neurosurgery Nurse Practitioner  [facility] [address], [place], [place] [address] P: [phone]    F: [phone]  08/15/2021 11:12 AM

## 2021-08-15 NOTE — Progress Notes (Incomplete)
PROGRESS NOTE   Jessica [name]  [id number] DOB: 04-28-1955 DOA: 08/13/2021 PCP: Samuel Bouche, NP  Brief Narrative:   66 year old white female class III obesity BMI 42, bipolar, DM TY 2 HLD HTN migraine with aura anemia Present [facility] after working third shift 08/11/2021 and had frequent falls-she went to [place] and represented to [facility] 5/26 with 7/10 back pain lower back pain urinary incontinence saddle paresthesias or  Found to have L2-3 disc herniation by MRI and Dr. Venetia Constable saw the patient Placed on oral Norco and IV morphine for breakthrough and low-dose gabapentin  Going for surgery  Hospital-Problem based course  L2-L3 disc herniation on MRI Undergoing microdiscectomy probably this afternoon Dr. Veatrice Kells 1-2 every 4 as needed moderate, morphine 1 mg every 2 as needed severe pain  perioperative care postop instructions ambulation and weightbearing status as per neurosurgery DM TY 2 A1c 6.6 well-controlled Class III obesity BMI >42 Metformin held from admission, on SSI, CBG = 156-270 Continue Levemir 35 units as well as sensitive sliding scale Continue gabapentin 100 twice daily pain control  HTN Continue  lisinopril/HCTZ 10/12.5  DVT prophylaxis: SCD Code Status: FULL Family Communication: nad Disposition:  Status is: Inpatient Remains inpatient appropriate because:    Consultants:  [place]  Procedures:   Antimicrobials:     Subjective:  Awake coherent No distress    Objective: Vitals:   08/15/21 0341 08/15/21 0635 08/15/21 0749 08/15/21 1115  BP: (!) 105/57  (!) 112/52 (!) 133/41  Pulse: 63 66 69 66  Resp: 17 18 16 16   Temp: 97.7 F (36.5 C)  98.1 F (36.7 C) 98 F (36.7 C)  TempSrc:   Oral Oral  SpO2: 100% 96% 97% 96%  Weight:      Height:        Intake/Output Summary (Last 24 hours) at 08/15/2021 1427 Last data filed at 08/15/2021 0749 Gross per 24 hour  Intake 1160 ml  Output 100 ml  Net 1060 ml    Filed  Weights   08/13/21 0921  Weight: 95.3 kg    Examination:  Eomi ncat no ict pallor Cta b no added sound no rales rhonchi Abd soft nt nd no rebound no guard Has pain in the LLE with SLR passively--sensory is intact Can plantarflex.  Data Reviewed: personally reviewed   CBC    Component Value Date/Time   WBC 15.3 (H) 08/15/2021 0229   RBC 4.30 08/15/2021 0229   HGB 12.2 08/15/2021 0229   HGB 12.7 06/09/2021 0750   HCT 36.6 08/15/2021 0229   PLT 240 08/15/2021 0229   PLT 281 06/09/2021 0750   MCV 85.1 08/15/2021 0229   MCH 28.4 08/15/2021 0229   MCHC 33.3 08/15/2021 0229   RDW 13.7 08/15/2021 0229   LYMPHSABS 0.4 (L) 08/15/2021 0229   MONOABS 0.5 08/15/2021 0229   EOSABS 0.0 08/15/2021 0229   BASOSABS 0.0 08/15/2021 0229      Latest Ref Rng & Units 08/15/2021    2:29 AM 08/13/2021    9:40 AM 04/12/2021    8:02 AM  CMP  Glucose 70 - 99 mg/dL 309   280   94    BUN 8 - 23 mg/dL 17   12   12     Creatinine 0.44 - 1.00 mg/dL 0.86   0.69   0.75    Sodium 135 - 145 mmol/L 133   137   135    Potassium 3.5 - 5.1 mmol/L 4.1   3.6   3.5  Chloride 98 - 111 mmol/L 100   99   97    CO2 22 - 32 mmol/L 23   28   30     Calcium 8.9 - 10.3 mg/dL 8.6   9.1   9.9    Total Protein 6.5 - 8.1 g/dL   7.4    Total Bilirubin 0.3 - 1.2 mg/dL   0.5    Alkaline Phos 38 - 126 U/L   82    AST 15 - 41 U/L   16    ALT 0 - 44 U/L   14       Radiology Studies: DG Lumbar Spine 2-3 Views  Result Date: 08/14/2021 CLINICAL DATA:  Intraoperative radiograph for L2-3 minimally invasive laminectomy/decompression. EXAM: LUMBAR SPINE - 2-3 VIEW COMPARISON:  MRI lumbar spine 08/13/2021 FINDINGS: Frontal and lateral views.A surgical instrument from a posterior approach overlies the posterior elements of the level of the L2-3 disc level. IMPRESSION: Surgical instrument overlies the posterior elements at the L2-3 disc level. Electronically Signed   By: Yvonne Kendall M.D.   On: 08/14/2021 15:37   MR LUMBAR SPINE  WO CONTRAST  Result Date: 08/13/2021 CLINICAL DATA:  Low back pain, increased fracture risk disc protrusion EXAM: MRI LUMBAR SPINE WITHOUT CONTRAST TECHNIQUE: Multiplanar, multisequence MR imaging of the lumbar spine was performed. No intravenous contrast was administered. COMPARISON:  None Available. FINDINGS: Segmentation: Standard segmentation is a soon. The inferior-most fully formed intervertebral disc labeled L5-S1. Alignment: Grade 1 retrolisthesis of L1 on L2 and L2 on L3. Grade 1 anterolisthesis of L4 on L5. Vertebrae: Vertebral body heights are maintained. No specific evidence of acute fracture or discitis/osteomyelitis. No suspicious bone lesions. Conus medullaris and cauda equina: Conus extends to the T12-L1 level. Conus appears normal. Paraspinal and other soft tissues: Unremarkable. Disc levels: T12-L1: Tiny central disc protrusion without significant stenosis. L1-L2: Mild disc bulging without significant stenosis. Bilateral facet arthropathy with small facet joint effusions. L2-L3: Broad disc bulge with superimposed left subarticular/foraminal disc protrusion which contacts, potentially affecting the descending left L3 nerve roots. Mild left foraminal stenosis. Mild central canal stenosis. L3-L4: Left foraminal disc protrusion without significant canal or foraminal stenosis. L4-L5: Broad disc bulging with superimposed small right paracentral disc protrusion. No significant canal or foraminal stenosis. L5-S1: Bilateral facet arthropathy without significant stenosis. IMPRESSION: 1. At L2-L3, left subarticular/foraminal disc protrusion with moderate stenosis of the left subarticular recess, potentially affecting the descending left L3 nerve roots. Mild left foraminal stenosis at this level. 2. Additional milder multilevel degenerative changes detailed above. Electronically Signed   By: Margaretha Sheffield M.D.   On: 08/13/2021 18:31   DG C-Arm 1-60 Min-No Report  Result Date: 08/14/2021 Fluoroscopy  was utilized by the requesting physician.  No radiographic interpretation.     Scheduled Meds:  acetaminophen  1,000 mg Oral Once   Chlorhexidine Gluconate Cloth  6 each Topical Q0600   gabapentin  100 mg Oral BID   lisinopril  10 mg Oral Daily   And   hydrochlorothiazide  12.5 mg Oral Daily   insulin aspart  0-9 Units Subcutaneous TID WC   insulin glargine-yfgn  35 Units Subcutaneous QHS   mupirocin ointment  1 application. Nasal BID   rosuvastatin  40 mg Oral Daily   sertraline  100 mg Oral Daily   sodium chloride flush  3 mL Intravenous Q12H   Continuous Infusions:  sodium chloride 250 mL (08/14/21 1846)     LOS: 2 days   Time spent: 56  Nita Sells, MD [facility] To contact the attending provider between 7A-7P or the covering provider during after hours 7P-7A, please log into the web site [email or web address] and access using universal [place] password for that web site. If you do not have the password, please call the hospital operator.  08/15/2021, 2:27 PM

## 2021-08-15 NOTE — Plan of Care (Incomplete)
Pt admitted for repair of Herniated Lumbar Disc. Has participated in all therapies and care plan, questions discussed with MD. Teaching provided to patient and family with verbalized understanding. Treatment regime and surgery completed with no difficulties. All personal belongings at bedside returned and in bag at patients side.  Assissted with dressing in personal clothes/shoes.

## 2021-08-15 NOTE — Discharge Summary (Signed)
Physician Discharge Summary  Jessica [name] [id number] DOB: 10-Jul-1955 DOA: 08/13/2021  PCP: Samuel Bouche, NP  Admit date: 08/13/2021 Discharge date: 08/15/2021  Time spent: 22 minutes  Recommendations for Outpatient Follow-up:  Follow with Neurosurgery as OP  Discharge Diagnoses:  MAIN problem for hospitalization   Lumbar disc herniation s/p repair  Please see below for itemized issues addressed in [place]- refer to other progress notes for clarity if needed  Discharge Condition: improved  Diet recommendation: hh dm  Filed Weights   08/13/21 0921  Weight: 63.35 kg   66 year old white female class III obesity BMI 42, bipolar, DM TY 2 HLD HTN migraine with aura anemia Present [facility] after working third shift 08/11/2021 and had frequent falls-she went to [place] and represented to [facility] 5/26 with 7/10 back pain lower back pain urinary incontinence saddle paresthesias or   Found to have L2-3 disc herniation by MRI and Dr. Venetia Constable saw the patient Placed on oral Norco and IV morphine for breakthrough and low-dose gabapentin  Going for surgery   Hospital-Problem based course   L2-L3 disc herniation on MRI Undergoing microdiscectomy probably this afternoon Dr. Veatrice Kells 1-2 every 4 as needed moderate, morphine 1 mg every 2 as needed severe pain  perioperative care postop instructions ambulation and weightbearing status as per neurosurgery DM TY 2 A1c 6.6 well-controlled Class III obesity BMI >42 Metformin resumed at d/c on SSI, CBG = mod controleld this stay Continue all home med son d/c as below HTN Continue  lisinopril/HCTZ 10/12.5   Discharge Exam: Vitals:   08/15/21 1200 08/15/21 1530  BP:  108/64  Pulse:  66  Resp:  16  Temp:  98.6 F (37 C)  SpO2: 96% 100%    Subj on day of d/c   Well ambulated on unit well no cp fever chills n/v  General Exam on discharge  Eomi ncat no focal deficit Abd soft nt nd no rebound SLR good Rom  intact  Discharge Instructions   Discharge Instructions     Diet - low sodium heart healthy   Complete by: As directed    Discharge instructions   Complete by: As directed    Follow Neurosurgery instructions and care for follow up Continue meds and take pain meds sparingly.   Increase activity slowly   Complete by: As directed    No wound care   Complete by: As directed    Per NS      Allergies as of 08/15/2021       Reactions   Aspirin    Bruising with long term use        Medication List     STOP taking these medications    traMADol 50 MG tablet Commonly known as: ULTRAM       TAKE these medications    amLODipine 5 MG tablet Commonly known as: NORVASC Take 1 tablet (5 mg total) by mouth daily.   EYE VITAMINS PO Take 1 tablet by mouth daily.   ferrous sulfate 325 (65 FE) MG tablet Take 1 tablet (325 mg total) by mouth 2 (two) times daily with a meal.   gabapentin 100 MG capsule Commonly known as: NEURONTIN Take 1 capsule (100 mg total) by mouth 2 (two) times daily.   HYDROcodone-acetaminophen 5-325 MG tablet Commonly known as: NORCO/VICODIN Take 1-2 tablets by mouth every 4 (four) hours as needed for moderate pain.   HYDROcodone-acetaminophen 5-325 MG tablet Commonly known as: NORCO/VICODIN Take 1-2 tablets by mouth every 4 (four)  hours as needed for moderate pain.   lisinopril-hydrochlorothiazide 10-12.5 MG tablet Commonly known as: ZESTORETIC Take 1 tablet by mouth daily.   magnesium (amino acid chelate) 133 MG tablet Take 1 tablet by mouth daily.   Melatonin 5 MG Chew Chew 5 mg by mouth at bedtime as needed (for sleep).   metFORMIN 1000 MG tablet Commonly known as: GLUCOPHAGE Take 1 tablet (1,000 mg total) by mouth 2 (two) times daily with a meal.   methocarbamol 500 MG tablet Commonly known as: ROBAXIN Take 1.5 tablets (750 mg total) by mouth 4 (four) times daily.   Ozempic (0.25 or 0.5 MG/DOSE) 2 MG/1.5ML Sopn Generic drug:  Semaglutide(0.25 or 0.5MG /DOS) Inject 0.5 mg into the skin once a week.   rosuvastatin 40 MG tablet Commonly known as: CRESTOR TAKE 1 TABLET (40 MG TOTAL) BY MOUTH AT BEDTIME. What changed:  how much to take when to take this   Semglee (yfgn) 100 UNIT/ML Pen Generic drug: insulin glargine-yfgn INJECT 50-80 UNITS INTO THE SKIN AT BEDTIME. INCREASE BY 2 UNITS EVERY 3 DAYS UNTIL FBS CONSISTENTLY 90-130 THEN STAY AT THAT DOSE. What changed:  how much to take how to take this when to take this additional instructions   sertraline 100 MG tablet Commonly known as: ZOLOFT Take 1 tablet (100 mg total) by mouth daily.               Durable Medical Equipment  (From admission, onward)           Start     Ordered   08/15/21 1444  For home use only DME Tub bench  Once        08/15/21 1444   08/15/21 1437  DME 3-in-1  Once        08/15/21 1437           Allergies  Allergen Reactions   Aspirin     Bruising with long term use    Follow-up Information     Judith Part, MD Follow up in 2 week(s).   Specialty: Neurosurgery Contact information: [place] [address] [phone]                  The results of significant diagnostics from this hospitalization (including imaging, microbiology, ancillary and laboratory) are listed below for reference.    Significant Diagnostic Studies: DG Lumbar Spine 2-3 Views  Result Date: 08/14/2021 CLINICAL DATA:  Intraoperative radiograph for L2-3 minimally invasive laminectomy/decompression. EXAM: LUMBAR SPINE - 2-3 VIEW COMPARISON:  MRI lumbar spine 08/13/2021 FINDINGS: Frontal and lateral views.A surgical instrument from a posterior approach overlies the posterior elements of the level of the L2-3 disc level. IMPRESSION: Surgical instrument overlies the posterior elements at the L2-3 disc level. Electronically Signed   By: Yvonne Kendall M.D.   On: 08/14/2021 15:37   MR LUMBAR SPINE WO  CONTRAST  Result Date: 08/13/2021 CLINICAL DATA:  Low back pain, increased fracture risk disc protrusion EXAM: MRI LUMBAR SPINE WITHOUT CONTRAST TECHNIQUE: Multiplanar, multisequence MR imaging of the lumbar spine was performed. No intravenous contrast was administered. COMPARISON:  None Available. FINDINGS: Segmentation: Standard segmentation is a soon. The inferior-most fully formed intervertebral disc labeled L5-S1. Alignment: Grade 1 retrolisthesis of L1 on L2 and L2 on L3. Grade 1 anterolisthesis of L4 on L5. Vertebrae: Vertebral body heights are maintained. No specific evidence of acute fracture or discitis/osteomyelitis. No suspicious bone lesions. Conus medullaris and cauda equina: Conus extends to the T12-L1 level. Conus appears  normal. Paraspinal and other soft tissues: Unremarkable. Disc levels: T12-L1: Tiny central disc protrusion without significant stenosis. L1-L2: Mild disc bulging without significant stenosis. Bilateral facet arthropathy with small facet joint effusions. L2-L3: Broad disc bulge with superimposed left subarticular/foraminal disc protrusion which contacts, potentially affecting the descending left L3 nerve roots. Mild left foraminal stenosis. Mild central canal stenosis. L3-L4: Left foraminal disc protrusion without significant canal or foraminal stenosis. L4-L5: Broad disc bulging with superimposed small right paracentral disc protrusion. No significant canal or foraminal stenosis. L5-S1: Bilateral facet arthropathy without significant stenosis. IMPRESSION: 1. At L2-L3, left subarticular/foraminal disc protrusion with moderate stenosis of the left subarticular recess, potentially affecting the descending left L3 nerve roots. Mild left foraminal stenosis at this level. 2. Additional milder multilevel degenerative changes detailed above. Electronically Signed   By: Margaretha Sheffield M.D.   On: 08/13/2021 18:31   DG C-Arm 1-60 Min-No Report  Result Date: 08/14/2021 Fluoroscopy was  utilized by the requesting physician.  No radiographic interpretation.    Microbiology: Recent Results (from the past 240 hour(s))  Surgical pcr screen     Status: Abnormal   Collection Time: 08/14/21  5:00 AM   Specimen: Nasal Mucosa; Nasal Swab  Result Value Ref Range Status   MRSA, PCR NEGATIVE NEGATIVE Final   Staphylococcus aureus POSITIVE (A) NEGATIVE Final    Comment: (NOTE) The Xpert SA Assay (FDA approved for NASAL specimens in patients 52 years of age and older), is one component of a comprehensive surveillance program. It is not intended to diagnose infection nor to guide or monitor treatment. Performed at [facility], [place] [address] [place], [place] [address]      Labs: Basic Metabolic Panel: Recent Labs  Lab 08/13/21 0940 08/15/21 0229  NA 137 133*  K 3.6 4.1  CL 99 100  CO2 28 23  GLUCOSE 280* 309*  BUN 12 17  CREATININE 0.69 0.86  CALCIUM 9.1 8.6*   Liver Function Tests: No results for input(s): AST, ALT, ALKPHOS, BILITOT, PROT, ALBUMIN in the last 168 hours. No results for input(s): LIPASE, AMYLASE in the last 168 hours. No results for input(s): AMMONIA in the last 168 hours. CBC: Recent Labs  Lab 08/13/21 0940 08/15/21 0229  WBC 8.8 15.3*  NEUTROABS  --  14.2*  HGB 12.7 12.2  HCT 38.9 36.6  MCV 85.7 85.1  PLT 269 240   Cardiac Enzymes: No results for input(s): CKTOTAL, CKMB, CKMBINDEX, TROPONINI in the last 168 hours. BNP: BNP (last 3 results) No results for input(s): BNP in the last 8760 hours.  ProBNP (last 3 results) No results for input(s): PROBNP in the last 8760 hours.  CBG: Recent Labs  Lab 08/14/21 1701 08/14/21 2144 08/15/21 0619 08/15/21 1121 08/15/21 1544  GLUCAP 202* 433* 233* 225* 348*       Signed:  Nita Sells MD   [facility] 08/15/2021, 5:35 PM

## 2021-08-17 ENCOUNTER — Telehealth: Payer: Self-pay | Admitting: *Deleted

## 2021-08-17 ENCOUNTER — Other Ambulatory Visit (HOSPITAL_BASED_OUTPATIENT_CLINIC_OR_DEPARTMENT_OTHER): Payer: Self-pay

## 2021-08-17 ENCOUNTER — Other Ambulatory Visit: Payer: Self-pay

## 2021-08-17 DIAGNOSIS — E1159 Type 2 diabetes mellitus with other circulatory complications: Secondary | ICD-10-CM

## 2021-08-17 DIAGNOSIS — E1129 Type 2 diabetes mellitus with other diabetic kidney complication: Secondary | ICD-10-CM

## 2021-08-17 NOTE — Patient Outreach (Signed)
Received a hospital discharge notification for Jessica [name] . The Primary Care Physician is using [facility] Embedded Nurse.   I have sent a referral to the Embedded Team.     Iverson Alamin, [facility] Care Management Assistant [facility] Care Management [phone]

## 2021-08-17 NOTE — Chronic Care Management (AMB) (Signed)
  Care Management   Note  08/17/2021 Name: Jessica [name] [facility] MRN: [id number] DOB: [birth year]  [name] Ibrahim is a 66 y.o. year old female who is a primary care patient of Christen Butter, NP. I reached out to [employer] by phone today offer care coordination services.   [name] was given information about care management services today including:  Care management services include personalized support from designated clinical staff supervised by her physician, including individualized plan of care and coordination with other care providers 24/7 contact phone numbers for assistance for urgent and routine care needs. The patient may stop care management services at any time by phone call to the office staff.  Patient agreed to services and verbal consent obtained.   Follow up plan: Telephone appointment with care management team member scheduled for: 08/25/2021  Burman Nieves, CCMA Care Guide, Embedded Care Coordination [facility]  Care Management  Direct Dial: [phone]

## 2021-08-18 ENCOUNTER — Encounter: Payer: Self-pay | Admitting: Family

## 2021-08-18 ENCOUNTER — Other Ambulatory Visit (HOSPITAL_BASED_OUTPATIENT_CLINIC_OR_DEPARTMENT_OTHER): Payer: Self-pay

## 2021-08-25 ENCOUNTER — Ambulatory Visit: Payer: No Typology Code available for payment source

## 2021-08-25 DIAGNOSIS — M5116 Intervertebral disc disorders with radiculopathy, lumbar region: Secondary | ICD-10-CM

## 2021-08-25 NOTE — Patient Instructions (Addendum)
Visit Information  Thank you for taking time to visit with me today. Please don't hesitate to contact me if I can be of assistance to you before our next scheduled telephone appointment.  Following are the goals we discussed today:  Patient Goals/Self-Care Activities: Take medications as prescribed   Attend all scheduled provider appointments Call pharmacy for medication refills 3-7 days in advance of running out of medications Call provider office for new concerns or questions  Discuss advance directive with your provider as needed Take notarized copy to you Primary care provider office once completed Review advance directive packet and contact your nurse case manager as needed  Our next appointment is by telephone on 09/29/21 10:00 am  Please call the care guide team at [phone] if you need to cancel or reschedule your appointment.   If you are experiencing a Mental Health or Behavioral Health Crisis or need someone to talk to, please call the Suicide and Crisis Lifeline: 988 call 1-800-273-TALK (toll free, 24 hour hotline)   Following is a copy of your full plan of care:  Care Plan : RN Care Manager Plan of Care  Updates made by Colletta [place], RN since 08/25/2021 12:00 AM     Problem: Chronic disease management and/or Care Coordination needs   Priority: High     Goal: Development of plan of care for Chronic Disease Managment and/or care coordination needs   Start Date: 08/25/2021  Expected End Date: 09/24/2021  Priority: High  Note:   Current Barriers: 66 year old with history of diabetes, HTN, HLD. Status post Lumbar Laminectomy on 08/14/21. Ms. Caridi reports she is doing well post procedure. She lives alone, but has very supportive family. She states she is walking with walker. She reports family comes to check on her at least 3 times/day. She has follow up appointment with surgeon scheduled for 09/02/21. She reports her daughter is going to take her to the appointment. She  denies any questions or concerns. Ms. Yambao does not want any disease management. AIC 6.4, which is improved from 7.6. She does not want to work on disease management. But would like to received advance directive packet. No Advanced Directives in place  RNCM Clinical Goal(s):  Patient will  Verbalize understanding of advance directive process  through collaboration with RN Care manager, provider, and care team Verbalize receipt of advance directive packet  Interventions: 1:1 collaboration with primary care provider as needed Inter-disciplinary care team collaboration (see longitudinal plan of care) Evaluation of current treatment plan related to  self management and patient's adherence to plan as established by provider Discussed fall prevention strategies and confirmed Reviewed after visit summary post laminectomy on 08/14/21 and confirmed she has a follow up appointment scheduled Reviewed equipment and assess for any equipment needs  Advance directive Interventions  (Status:  New goal.)  Short Term Goal Advised patient to discuss with provider as needed Provided education to patient and/or caregiver about advanced directives  Discussed plans with patient for ongoing care management follow up and provided patient with direct contact information for care management team  Patient Goals/Self-Care Activities: Take medications as prescribed   Attend all scheduled provider appointments Call pharmacy for medication refills 3-7 days in advance of running out of medications Call provider office for new concerns or questions  Discuss advance directive with your provider as needed Take notarized copy to you Primary care provider office once completed Review advance directive packet and contact your nurse case manager as needed  Ms. Napoles was given information about Care Management services by the embedded care coordination team including:  Care Management services include personalized  support from designated clinical staff supervised by her physician, including individualized plan of care and coordination with other care providers 24/7 contact phone numbers for assistance for urgent and routine care needs. The patient may stop CCM services at any time (effective at the end of the month) by phone call to the office staff.  Patient agreed to services and verbal consent obtained.   The patient verbalized understanding of instructions, educational materials, and care plan provided today and agreed to receive a mailed copy of patient instructions, educational materials, and care plan.   The patient has been provided with contact information for the care management team and has been advised to call with any health related questions or concerns.   Kathyrn Sheriff, RN, MSN, BSN, CCM Care Management Coordinator [facility] [phone]

## 2021-08-25 NOTE — Chronic Care Management (AMB) (Signed)
Care Management    RN Visit Note  08/25/2021 Name: [name] [facility] MRN: [id number] DOB: [birth year]  Subjective: Jessica [name] is a 66 y.o. year old female who is a primary care patient of Jessica Butter, NP. The care management team was consulted for assistance with disease management and care coordination needs.    Engaged with patient by telephone for initial visit in response to provider referral for case management and/or care coordination services.   Consent to Services:   [name] was given information about Care Management services today including:  Care Management services includes personalized support from designated clinical staff supervised by her physician, including individualized plan of care and coordination with other care providers 24/7 contact phone numbers for assistance for urgent and routine care needs. The patient may stop case management services at any time by phone call to the office staff.  Patient agreed to services and consent obtained.   Assessment: Review of patient past medical history, allergies, medications, health status, including review of consultants reports, laboratory and other test data, was performed as part of comprehensive evaluation and provision of chronic care management services.   SDOH (Social Determinants of Health) assessments and interventions performed:  SDOH Interventions    Flowsheet Row Most Recent Value  SDOH Interventions   Food Insecurity Interventions Intervention Not Indicated  Transportation Interventions Intervention Not Indicated        Care Plan  Allergies  Allergen Reactions   Aspirin     Bruising with long term use    Outpatient Encounter Medications as of 08/25/2021  Medication Sig Note   amLODipine (NORVASC) 5 MG tablet Take 1 tablet (5 mg total) by mouth daily.    gabapentin (NEURONTIN) 100 MG capsule Take 1 capsule (100 mg total) by mouth 2 (two) times daily.    insulin glargine-yfgn  (SEMGLEE) 100 UNIT/ML Pen INJECT 50-80 UNITS INTO THE SKIN AT BEDTIME. INCREASE BY 2 UNITS EVERY 3 DAYS UNTIL FBS CONSISTENTLY 90-130 THEN STAY AT THAT DOSE. (Patient taking differently: Inject 50-80 Units into the skin daily. Per sliding scale per patient) 08/25/2021: Reports is taking 30 units at bedtime   lisinopril-hydrochlorothiazide (ZESTORETIC) 10-12.5 MG tablet Take 1 tablet by mouth daily.    Melatonin 5 MG CHEW Chew 5 mg by mouth at bedtime as needed (for sleep).    metFORMIN (GLUCOPHAGE) 1000 MG tablet Take 1 tablet (1,000 mg total) by mouth 2 (two) times daily with a meal.    methocarbamol (ROBAXIN) 500 MG tablet Take 1.5 tablets (750 mg total) by mouth 4 (four) times daily.    Multiple Vitamins-Minerals (EYE VITAMINS PO) Take 1 tablet by mouth daily.    rosuvastatin (CRESTOR) 40 MG tablet TAKE 1 TABLET (40 MG TOTAL) BY MOUTH AT BEDTIME. (Patient taking differently: Take 40 mg by mouth daily.)    Semaglutide,0.25 or 0.5MG /DOS, (OZEMPIC, 0.25 OR 0.5 MG/DOSE,) 2 MG/1.5ML SOPN Inject 0.5 mg into the skin once a week. 08/13/2021: Usually take on Sundays per patient    sertraline (ZOLOFT) 100 MG tablet Take 1 tablet (100 mg total) by mouth daily.    Specialty Vitamins Products (MAGNESIUM, AMINO ACID CHELATE,) 133 MG tablet Take 1 tablet by mouth daily.    ferrous sulfate 325 (65 FE) MG tablet Take 1 tablet (325 mg total) by mouth 2 (two) times daily with a meal. (Patient not taking: Reported on 06/09/2021)    HYDROcodone-acetaminophen (NORCO/VICODIN) 5-325 MG tablet Take 1-2 tablets by mouth every 4 (four) hours as needed for  moderate pain. (Patient not taking: Reported on 08/25/2021)    HYDROcodone-acetaminophen (NORCO/VICODIN) 5-325 MG tablet Take 1-2 tablets by mouth every 4 (four) hours as needed for moderate pain. (Patient not taking: Reported on 08/25/2021)    No facility-administered encounter medications on file as of 08/25/2021.    Patient Active Problem List   Diagnosis Date Noted   Lumbar  disc herniation with radiculopathy 08/13/2021   Hyperlipidemia 08/13/2021   Anxiety and depression 08/13/2021   Iron deficiency anemia 04/13/2021   Chronic heel pain, left 07/05/2019   Encounter for medication monitoring 09/05/2018   Low mean corpuscular volume (MCV) 09/05/2018   History of iron deficiency anemia 09/05/2018   History of vitamin D deficiency    Ulnar neuropathy at elbow, right 06/05/2018   Leg edema, left 02/23/2018   Angular cheilitis 02/23/2018   Hyperlipidemia associated with type 2 diabetes mellitus (HCC) 02/23/2018   Hypertension associated with diabetes (HCC) 02/23/2018   Diabetic 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 (HCC) 02/23/2018   Epistaxis 09/12/2017   Carpal tunnel syndrome 09/13/2016   Headache, chronic daily 09/13/2016   Acute medial meniscus tear of left knee 08/10/2016   Primary osteoarthritis of left knee 08/10/2016   Tinnitus of both ears 08/10/2016   Hydradenitis 01/18/2016   Primary osteoarthritis of both first carpometacarpal joints 01/06/2016   Insomnia 11/24/2015   Anxiety 07/30/2015   Episode of recurrent major depressive disorder (HCC) 07/30/2015   Long-term insulin use (HCC) 07/30/2015   Microalbuminuria due to type 2 diabetes mellitus (HCC) 07/30/2015   Type 2 diabetes mellitus with microalbuminuria, with long-term current use of insulin (HCC) 02/06/2007   Benign essential HTN 09/01/2006    Conditions to be addressed/monitored: HTN, HLD, DMII, and Post Laminectomy  Care Plan : RN Care Manager Plan of Care  Updates made by Colletta MarylandWallace, Tonjia Parillo M, RN since 08/25/2021 12:00 AM     Problem: Chronic disease management and/or Care Coordination needs   Priority: High     Goal: Development of plan of care for Chronic Disease Managment and/or care coordination needs   Start Date: 08/25/2021  Expected End Date: 09/29/2021  Priority: High  Note:   Current Barriers: 66 year old with history of diabetes, HTN, HLD. Lumbar disc  herniation/ status post repair on 08/14/21. [name] reports she is doing well post procedure. She lives alone, but has very supportive family. She states she is walking with walker. She reports family comes to check on her at least 3 times/day. She has follow up appointment with surgeon scheduled for 09/02/21. She reports her daughter is going to take her to the appointment. She denies any questions or concerns. [name] does not want any disease management. AIC 6.4, which is improved from 7.6. She does not want to work on disease management. But would like to received advance directive packet. No Advanced Directives in place  RNCM Clinical Goal(s):  Patient will  Verbalize understanding of advance directive process  through collaboration with RN Care manager, provider, and care team Verbalize receipt of advance directive packet  Interventions: 1:1 collaboration with primary care provider as needed Inter-disciplinary care team collaboration (see longitudinal plan of care) Evaluation of current treatment plan related to  self management and patient's adherence to plan as established by provider Discussed fall prevention strategies and confirmed Reviewed after visit summary post repair of herniated disc on 08/14/21 and confirmed she has a follow up appointment scheduled Reviewed equipment and assess for any equipment needs  Advance directive Interventions  (  Status:  New goal.)  Short Term Goal Advised patient to discuss with provider as needed Provided education to patient and/or caregiver about advanced directives  Discussed plans with patient for ongoing care management follow up and provided patient with direct contact information for care management team  Patient Goals/Self-Care Activities: Take medications as prescribed   Attend all scheduled provider appointments Call pharmacy for medication refills 3-7 days in advance of running out of medications Call provider office for new  concerns or questions  Discuss advance directive with your provider as needed Take notarized copy to you Primary care provider office once completed Review advance directive packet and contact your nurse case manager as needed   Plan: Telephone follow up appointment with care management team member scheduled for:  09/29/21 The patient has been provided with contact information for the care management team and has been advised to call with any health related questions or concerns.   Kathyrn Sheriff, RN, MSN, BSN, CCM Care Management Coordinator [facility] [phone]

## 2021-09-03 ENCOUNTER — Other Ambulatory Visit (HOSPITAL_BASED_OUTPATIENT_CLINIC_OR_DEPARTMENT_OTHER): Payer: Self-pay

## 2021-09-03 ENCOUNTER — Other Ambulatory Visit: Payer: Self-pay | Admitting: Medical-Surgical

## 2021-09-03 DIAGNOSIS — F330 Major depressive disorder, recurrent, mild: Secondary | ICD-10-CM

## 2021-09-03 DIAGNOSIS — E1169 Type 2 diabetes mellitus with other specified complication: Secondary | ICD-10-CM

## 2021-09-03 MED ORDER — ROSUVASTATIN CALCIUM 40 MG PO TABS
ORAL_TABLET | Freq: Every day | ORAL | 0 refills | Status: DC
  Filled 2021-09-03: qty 90, fill #0
  Filled 2021-09-29: qty 90, 90d supply, fill #0

## 2021-09-03 MED ORDER — LISINOPRIL-HYDROCHLOROTHIAZIDE 10-12.5 MG PO TABS
1.0000 | ORAL_TABLET | Freq: Every day | ORAL | 0 refills | Status: DC
  Filled 2021-09-03 – 2021-09-29 (×2): qty 90, 90d supply, fill #0

## 2021-09-03 MED ORDER — SERTRALINE HCL 100 MG PO TABS
100.0000 mg | ORAL_TABLET | Freq: Every day | ORAL | 0 refills | Status: DC
  Filled 2021-09-03: qty 90, 90d supply, fill #0

## 2021-09-06 ENCOUNTER — Ambulatory Visit (INDEPENDENT_AMBULATORY_CARE_PROVIDER_SITE_OTHER): Payer: No Typology Code available for payment source

## 2021-09-06 ENCOUNTER — Encounter: Payer: Self-pay | Admitting: Physician Assistant

## 2021-09-06 ENCOUNTER — Ambulatory Visit (INDEPENDENT_AMBULATORY_CARE_PROVIDER_SITE_OTHER): Payer: No Typology Code available for payment source | Admitting: Physician Assistant

## 2021-09-06 DIAGNOSIS — M25562 Pain in left knee: Secondary | ICD-10-CM

## 2021-09-06 MED ORDER — METHYLPREDNISOLONE ACETATE 40 MG/ML IJ SUSP
40.0000 mg | INTRAMUSCULAR | Status: AC | PRN
  Administered 2021-09-06: 40 mg via INTRA_ARTICULAR

## 2021-09-06 MED ORDER — LIDOCAINE HCL 1 % IJ SOLN
3.0000 mL | INTRAMUSCULAR | Status: AC | PRN
  Administered 2021-09-06: 3 mL

## 2021-09-06 NOTE — Progress Notes (Signed)
Office Visit Note   Patient: Jessica [name]           Date of Birth: 08-12-1955           MRN: [id number] Visit Date: 09/06/2021              Requested by: Samuel Bouche, NP [place],  [place] [id number] PCP: Samuel Bouche, NP   Assessment & Plan: Visit Diagnoses:  1. Acute pain of left knee     Plan: She will follow-up with [place] in 2 weeks see what type of response she had to the injection.  Questions were encouraged and answered at length.  She knows to monitor her glucose levels closely over the next 24 to 48 hours.  Follow-Up Instructions: Return in about 2 weeks (around 09/20/2021).   Orders:  Orders Placed This Encounter  Procedures   Large Joint Inj   XR Knee 1-2 Views Left   No orders of the defined types were placed in this encounter.     Procedures: Large Joint Inj: L knee on 09/06/2021 10:38 AM Indications: pain Details: 22 G 1.5 in needle, anterolateral approach  Arthrogram: No  Medications: 3 mL lidocaine 1 %; 40 mg methylPREDNISolone acetate 40 MG/ML Outcome: tolerated well, no immediate complications Procedure, treatment alternatives, risks and benefits explained, specific risks discussed. Consent was given by the patient. Immediately prior to procedure a time out was called to verify the correct patient, procedure, equipment, support staff and site/side marked as required. Patient was prepped and draped in the usual sterile fashion.       Clinical Data: No additional findings.   Subjective: Chief Complaint  Patient presents with   Left Knee - Pain    HPI Jessica [name] comes in today complaining of left knee pain.  She is using a walker to ambulate.  She had emergency back surgery after a fall sometime around [facility].  States her left knee is still buckling.  Notes no swelling in the knee.  Is been taken Tylenol for the pain and also using Robaxin at night.  Denies any fevers or chills.  She is diabetic reports  that her glucose levels are running 80-200 averages 150.  She is able to use insulin to adjust her glucose levels.  Review of Systems See HPI  Objective: Vital Signs: There were no vitals taken for this visit.  Physical Exam Constitutional:      Appearance: She is not ill-appearing.  Pulmonary:     Effort: Pulmonary effort is normal.  Neurological:     Mental Status: She is alert and oriented to person, place, and time.  Psychiatric:        Mood and Affect: Mood normal.     Ortho Exam Bilateral knees hyperextend slightly.  Tenderness along medial joint line of both knees.  Patellofemoral crepitus both knees.  Tenderness to left peripatellar region.  No instability valgus varus stress in either knee.  No abnormal warmth erythema or effusion of either knee. Specialty Comments:  No specialty comments available.  Imaging: XR Knee 1-2 Views Left  Result Date: 09/06/2021 Left knee: Knee is well located.  No acute fractures.  Moderate medial and patellofemoral compartmental arthritic changes with periarticular spurring.  Lateral compartment overall well preserved.    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   Hyperlipidemia 08/13/2021   Anxiety and depression 08/13/2021   Iron  deficiency anemia 04/13/2021   Chronic heel pain, left 07/05/2019   Encounter for medication monitoring 09/05/2018   Low mean corpuscular volume (MCV) 09/05/2018   History of iron deficiency anemia 09/05/2018   History of vitamin D deficiency    Ulnar neuropathy at elbow, right 06/05/2018   Leg edema, left 02/23/2018   Angular cheilitis 02/23/2018   Hyperlipidemia associated with type 2 diabetes mellitus ([place]) 02/23/2018   Hypertension associated with diabetes ([place]) 02/23/2018   Diabetic 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 ([place]) 02/23/2018   Epistaxis 09/12/2017   Carpal tunnel syndrome 09/13/2016    Headache, chronic daily 09/13/2016   Acute medial meniscus tear of left knee 08/10/2016   Primary osteoarthritis of left knee 08/10/2016   Tinnitus of both ears 08/10/2016   Hydradenitis 01/18/2016   Primary osteoarthritis of both first carpometacarpal joints 01/06/2016   Insomnia 11/24/2015   Anxiety 07/30/2015   Episode of recurrent major depressive disorder ([place]) 07/30/2015   Long-term insulin use ([place]) 07/30/2015   Microalbuminuria due to type 2 diabetes mellitus ([place]) 07/30/2015   Type 2 diabetes mellitus with microalbuminuria, with long-term current use of insulin ([place]) 02/06/2007   Benign essential HTN 09/01/2006   Past Medical History:  Diagnosis Date   Anemia    Anxiety    Arthritis    Carpal tunnel syndrome    Chronic headaches    Complication of anesthesia    Anxiety after waking up before   Depression    Diabetes mellitus without complication ([place])    History of vitamin D deficiency    Hyperlipidemia    Hypertension    Obesity    PERSISTENT MIGRAINE AURA W/CI W/INTRACTABLE W/SM 02/06/2007   Qualifier: Diagnosis of  By: Madilyn Fireman MD, Catherine     Tinnitus aurium, bilateral     Family History  Problem Relation Age of Onset   Hyperlipidemia Mother    Hypertension Mother    Stroke Father    Thyroid cancer Sister    Diabetes Maternal Aunt    Skin cancer Maternal Uncle     Past Surgical History:  Procedure Laterality Date   APPENDECTOMY     CARDIAC CATHETERIZATION     Negative. Dx was Anxiety   HERNIA REPAIR     LEG SURGERY     LUMBAR LAMINECTOMY/ DECOMPRESSION WITH MET-RX Left 08/14/2021   Procedure: LUMBAR TWO-THREE MINIMALLY INVASIVE LAMINECTOMY/ DECOMPRESSION;  Surgeon: Judith Part, MD;  Location: [facility];  Service: Neurosurgery;  Laterality: Left;   TUBAL LIGATION     Social History   Occupational History   Not on file  Tobacco Use   Smoking status: Never   Smokeless tobacco: Never  Vaping Use   Vaping Use: Never used  Substance and Sexual  Activity   Alcohol use: No   Drug use: Never   Sexual activity: Not Currently    Birth control/protection: None, Post-menopausal, Abstinence

## 2021-09-08 ENCOUNTER — Telehealth: Payer: No Typology Code available for payment source

## 2021-09-10 ENCOUNTER — Other Ambulatory Visit: Payer: No Typology Code available for payment source

## 2021-09-10 ENCOUNTER — Ambulatory Visit: Payer: No Typology Code available for payment source | Admitting: Family

## 2021-09-20 ENCOUNTER — Ambulatory Visit: Payer: No Typology Code available for payment source | Admitting: Physician Assistant

## 2021-09-29 ENCOUNTER — Telehealth: Payer: Self-pay

## 2021-09-29 ENCOUNTER — Telehealth: Payer: No Typology Code available for payment source

## 2021-09-29 ENCOUNTER — Other Ambulatory Visit (HOSPITAL_BASED_OUTPATIENT_CLINIC_OR_DEPARTMENT_OTHER): Payer: Self-pay

## 2021-09-29 NOTE — Telephone Encounter (Signed)
  Care Management   Follow Up Note   09/29/2021 Name: [name] MRN: [id number] DOB: 02/09/1956   Referred by: Christen Butter, NP Reason for referral : No chief complaint on file.   An unsuccessful telephone outreach was attempted today. The patient was referred to the case management team for assistance with care management and care coordination.   Follow Up Plan: The care management team will reach out to the patient again over the next 30 days.   Kathyrn Sheriff, RN, MSN, BSN, CCM Care Management Coordinator [facility] [phone]

## 2021-10-03 NOTE — Progress Notes (Unsigned)
   Established Patient Office Visit  Subjective   Patient ID: Jessica [name], female   DOB: 1955-10-13 Age: 66 y.o. MRN: [id number]   No chief complaint on file.  HPI Pleasant 66 year old female presenting today for the following:  HTN:  DM:  HLD:  IDA:  Vitamin D deficiency:  Anxiety/depression:  ROS    Objective:    There were no vitals filed for this visit.   Physical Exam    No results found for this or any previous visit (from the past 24 hour(s)).   {Labs (Optional):23779}  The ASCVD Risk score (Arnett DK, et al., 2019) failed to calculate for the following reasons:   The valid total cholesterol range is 130 to 320 mg/dL   Assessment & Plan:   No problem-specific Assessment & Plan notes found for this encounter.   No follow-ups on file.  ___________________________________________ Thayer Ohm, DNP, APRN, FNP-BC Primary Care and Sports Medicine [facility]

## 2021-10-04 ENCOUNTER — Ambulatory Visit (INDEPENDENT_AMBULATORY_CARE_PROVIDER_SITE_OTHER): Payer: No Typology Code available for payment source | Admitting: Medical-Surgical

## 2021-10-04 ENCOUNTER — Other Ambulatory Visit: Payer: No Typology Code available for payment source

## 2021-10-04 ENCOUNTER — Encounter: Payer: Self-pay | Admitting: Medical-Surgical

## 2021-10-04 ENCOUNTER — Ambulatory Visit: Payer: No Typology Code available for payment source | Admitting: Family

## 2021-10-04 ENCOUNTER — Other Ambulatory Visit (HOSPITAL_BASED_OUTPATIENT_CLINIC_OR_DEPARTMENT_OTHER): Payer: Self-pay

## 2021-10-04 VITALS — BP 108/72 | HR 64 | Resp 20 | Ht 59.0 in | Wt 208.0 lb

## 2021-10-04 DIAGNOSIS — E1169 Type 2 diabetes mellitus with other specified complication: Secondary | ICD-10-CM

## 2021-10-04 DIAGNOSIS — F419 Anxiety disorder, unspecified: Secondary | ICD-10-CM

## 2021-10-04 DIAGNOSIS — Z794 Long term (current) use of insulin: Secondary | ICD-10-CM

## 2021-10-04 DIAGNOSIS — Z8639 Personal history of other endocrine, nutritional and metabolic disease: Secondary | ICD-10-CM

## 2021-10-04 DIAGNOSIS — E785 Hyperlipidemia, unspecified: Secondary | ICD-10-CM

## 2021-10-04 DIAGNOSIS — E1129 Type 2 diabetes mellitus with other diabetic kidney complication: Secondary | ICD-10-CM | POA: Diagnosis not present

## 2021-10-04 DIAGNOSIS — I152 Hypertension secondary to endocrine disorders: Secondary | ICD-10-CM

## 2021-10-04 DIAGNOSIS — D509 Iron deficiency anemia, unspecified: Secondary | ICD-10-CM

## 2021-10-04 DIAGNOSIS — R809 Proteinuria, unspecified: Secondary | ICD-10-CM | POA: Diagnosis not present

## 2021-10-04 DIAGNOSIS — E1159 Type 2 diabetes mellitus with other circulatory complications: Secondary | ICD-10-CM

## 2021-10-04 DIAGNOSIS — F32A Depression, unspecified: Secondary | ICD-10-CM

## 2021-10-04 LAB — POCT GLYCOSYLATED HEMOGLOBIN (HGB A1C): HbA1c, POC (controlled diabetic range): 7 % (ref 0.0–7.0)

## 2021-10-04 LAB — POCT UA - MICROALBUMIN
Albumin/Creatinine Ratio, Urine, POC: <30
Creatinine, POC: 100 mg/dL
Microalbumin Ur, POC: 30 mg/L

## 2021-10-04 MED ORDER — SEMAGLUTIDE (1 MG/DOSE) 4 MG/3ML ~~LOC~~ SOPN
1.0000 mg | PEN_INJECTOR | SUBCUTANEOUS | 3 refills | Status: DC
  Filled 2021-10-04: qty 9, 84d supply, fill #0
  Filled 2021-12-21: qty 9, 84d supply, fill #1
  Filled 2022-03-18: qty 9, 84d supply, fill #2
  Filled 2022-06-14 – 2022-06-24 (×2): qty 9, 84d supply, fill #3

## 2021-10-04 MED ORDER — LISINOPRIL-HYDROCHLOROTHIAZIDE 10-12.5 MG PO TABS
1.0000 | ORAL_TABLET | Freq: Every day | ORAL | 0 refills | Status: DC
Start: 2021-10-04 — End: 2021-11-19
  Filled 2021-10-04: qty 90, 90d supply, fill #0

## 2021-10-04 MED ORDER — AMLODIPINE BESYLATE 5 MG PO TABS
5.0000 mg | ORAL_TABLET | Freq: Every day | ORAL | 1 refills | Status: DC
  Filled 2021-10-04 – 2021-12-21 (×2): qty 90, 90d supply, fill #0
  Filled 2022-03-18 (×2): qty 90, 90d supply, fill #1

## 2021-10-04 NOTE — Assessment & Plan Note (Addendum)
Checking labs today.  Blood pressure very well controlled today.  Continue amlodipine and lisinopril-hydrochlorothiazide as prescribed daily.  Continue to monitor blood pressure at home with goal of 130/80 or less.  If blood pressures begin to run low or she becomes symptomatic, advised to contact our office for further instructions.

## 2021-10-04 NOTE — Assessment & Plan Note (Signed)
Vitamin D has been normal and stable so deferring labs today.  Continue oral vitamin D supplementation.

## 2021-10-04 NOTE — Assessment & Plan Note (Signed)
Checking lipid panel today.  Continue rosuvastatin 40 mg daily.  Recommend low-fat diet.

## 2021-10-04 NOTE — Assessment & Plan Note (Signed)
POCT hemoglobin A1c 7.0% although this is likely negatively affected by her recent hospitalization and poor management.  Continue metformin 1000 mg twice daily.  Continue semaglutide however we will increase this to 1 mg weekly.  Continue Semglee 30 units nightly but may need to begin titrating this down as her Ozempic dose goes up.  Discussed recommendations for titration and monitoring glucose.  Patient verbalized understanding is agreeable to the plan.

## 2021-10-04 NOTE — Assessment & Plan Note (Signed)
Managed by hematology.  Upcoming appointment this week.

## 2021-10-04 NOTE — Assessment & Plan Note (Signed)
Continue sertraline 100 mg daily.  Patient declines further adjustment in her medication I feel like it is working well.  Denies SI/HI so we will just continue to monitor for now.  If her mood issues worsen, we will absolutely work on adjusting medications and optimizing her treatment.  She will let me know.

## 2021-10-05 ENCOUNTER — Other Ambulatory Visit: Payer: No Typology Code available for payment source

## 2021-10-05 ENCOUNTER — Ambulatory Visit: Payer: No Typology Code available for payment source | Admitting: Family

## 2021-10-05 ENCOUNTER — Telehealth: Payer: Self-pay | Admitting: Physician Assistant

## 2021-10-05 LAB — CBC WITH DIFFERENTIAL/PLATELET
Absolute Monocytes: 445 cells/uL (ref 200–950)
Basophils Absolute: 53 cells/uL (ref 0–200)
Basophils Relative: 0.5 %
Eosinophils Absolute: 541 cells/uL — ABNORMAL HIGH (ref 15–500)
Eosinophils Relative: 5.1 %
HCT: 40 % (ref 35.0–45.0)
Hemoglobin: 13.5 g/dL (ref 11.7–15.5)
Lymphs Abs: 1484 cells/uL (ref 850–3900)
MCH: 28.6 pg (ref 27.0–33.0)
MCHC: 33.8 g/dL (ref 32.0–36.0)
MCV: 84.7 fL (ref 80.0–100.0)
MPV: 9.2 fL (ref 7.5–12.5)
Monocytes Relative: 4.2 %
Neutro Abs: 8077 cells/uL — ABNORMAL HIGH (ref 1500–7800)
Neutrophils Relative %: 76.2 %
Platelets: 357 10*3/uL (ref 140–400)
RBC: 4.72 10*6/uL (ref 3.80–5.10)
RDW: 12.7 % (ref 11.0–15.0)
Total Lymphocyte: 14 %
WBC: 10.6 10*3/uL (ref 3.8–10.8)

## 2021-10-05 LAB — LIPID PANEL
Cholesterol: 147 mg/dL (ref <200)
HDL: 59 mg/dL (ref >=50)
LDL Cholesterol (Calc): 64 mg/dL (calc)
Non-HDL Cholesterol (Calc): 88 mg/dL (calc) (ref <130)
Total CHOL/HDL Ratio: 2.5 (calc) (ref <5.0)
Triglycerides: 163 mg/dL — ABNORMAL HIGH (ref <150)

## 2021-10-05 LAB — COMPLETE METABOLIC PANEL WITH GFR
AG Ratio: 1.5 (calc) (ref 1.0–2.5)
ALT: 19 U/L (ref 6–29)
AST: 17 U/L (ref 10–35)
Albumin: 4.4 g/dL (ref 3.6–5.1)
Alkaline phosphatase (APISO): 72 U/L (ref 37–153)
BUN: 18 mg/dL (ref 7–25)
CO2: 30 mmol/L (ref 20–32)
Calcium: 9.8 mg/dL (ref 8.6–10.4)
Chloride: 94 mmol/L — ABNORMAL LOW (ref 98–110)
Creat: 0.71 mg/dL (ref 0.50–1.05)
Globulin: 2.9 g/dL (calc) (ref 1.9–3.7)
Glucose, Bld: 151 mg/dL — ABNORMAL HIGH (ref 65–99)
Potassium: 4.4 mmol/L (ref 3.5–5.3)
Sodium: 136 mmol/L (ref 135–146)
Total Bilirubin: 0.6 mg/dL (ref 0.2–1.2)
Total Protein: 7.3 g/dL (ref 6.1–8.1)
eGFR: 95 mL/min/{1.73_m2} (ref >=60)

## 2021-10-05 NOTE — Telephone Encounter (Signed)
Please get auth, thank you

## 2021-10-05 NOTE — Telephone Encounter (Signed)
Patient called asked if she can get the gel injection in her right knee? The number to contact patient is [phone]

## 2021-10-07 ENCOUNTER — Inpatient Hospital Stay: Payer: No Typology Code available for payment source | Admitting: Family

## 2021-10-07 ENCOUNTER — Inpatient Hospital Stay: Payer: No Typology Code available for payment source

## 2021-10-11 ENCOUNTER — Telehealth: Payer: Self-pay

## 2021-10-11 NOTE — Telephone Encounter (Signed)
  Care Management   Follow Up Note   10/11/2021 Name: Jessica [name] [facility] MRN: [id number] DOB: 1955-10-07   Referred by: Christen Butter, NP Reason for referral : No chief complaint on file.   A second unsuccessful telephone outreach was attempted today. The patient was referred to the case management team for assistance with care management and care coordination.   Follow Up Plan: The care management team will reach out to the patient again over the next 14 days.   Kathyrn Sheriff, RN, MSN, BSN, CCM Care Management Coordinator [facility] [phone]

## 2021-10-13 ENCOUNTER — Ambulatory Visit: Payer: Self-pay

## 2021-10-13 NOTE — Telephone Encounter (Signed)
This encounter was created in error - please disregard.

## 2021-10-13 NOTE — Patient Instructions (Signed)
Visit Information  Thank you for allowing me to share the care management and care coordination services that are available to you as part of your health plan and services through your primary care provider and medical home. Please reach out to your primary care provider or me at [phone], if the care management/care coordination team may be of assistance to you in the future.   Kathyrn Sheriff, RN, MSN, BSN, CCM Care Management Coordinator [facility] [phone]

## 2021-10-13 NOTE — Chronic Care Management (AMB) (Signed)
Care Management    RN Visit Note  10/13/2021 Name: Jessica [name] [facility] MRN: [id number] DOB: [birth year]  Subjective: Jessica [name] is a 66 y.o. year old female who is a primary care patient of Jessica Bouche, NP. The care management team was consulted for assistance with disease management and care coordination needs.    Engaged with patient by telephone for follow up visit in response to provider referral for case management and/or care coordination services.   Consent to Services:   Jessica [name] was given information about Care Management services today including:  Care Management services includes personalized support from designated clinical staff supervised by her physician, including individualized plan of care and coordination with other care providers 24/7 contact phone numbers for assistance for urgent and routine care needs. The patient may stop case management services at any time by phone call to the office staff.  Patient agreed to services and consent obtained.   Assessment: Review of patient past medical history, allergies, medications, health status, including review of consultants reports, laboratory and other test data, was performed as part of comprehensive evaluation and provision of chronic care management services.   SDOH (Social Determinants of Health) assessments and interventions performed:    Care Plan  Allergies  Allergen Reactions   Aspirin     Bruising with long term use    Outpatient Encounter Medications as of 10/13/2021  Medication Sig Note   amLODipine (NORVASC) 5 MG tablet Take 1 tablet (5 mg total) by mouth daily.    insulin glargine-yfgn (SEMGLEE) 100 UNIT/ML Pen INJECT 50-80 UNITS INTO THE SKIN AT BEDTIME. INCREASE BY 2 UNITS EVERY 3 DAYS UNTIL FBS CONSISTENTLY 90-130 THEN STAY AT THAT DOSE. (Patient taking differently: Inject 50-80 Units into the skin daily. Per sliding scale per patient) 08/25/2021: Reports is taking 30 units at bedtime    lisinopril-hydrochlorothiazide (ZESTORETIC) 10-12.5 MG tablet Take 1 tablet by mouth daily.    Melatonin 5 MG CHEW Chew 5 mg by mouth at bedtime as needed (for sleep).    metFORMIN (GLUCOPHAGE) 1000 MG tablet Take 1 tablet (1,000 mg total) by mouth 2 (two) times daily with a meal.    methocarbamol (ROBAXIN) 500 MG tablet Take 1.5 tablets (750 mg total) by mouth 4 (four) times daily.    Multiple Vitamins-Minerals (EYE VITAMINS PO) Take 1 tablet by mouth daily.    rosuvastatin (CRESTOR) 40 MG tablet TAKE 1 TABLET (40 MG TOTAL) BY MOUTH AT BEDTIME.    Semaglutide, 1 MG/DOSE, 4 MG/3ML SOPN Inject 1 mg under the skin as directed once a week.    sertraline (ZOLOFT) 100 MG tablet Take 1 tablet (100 mg total) by mouth daily.    Specialty Vitamins Products (MAGNESIUM, AMINO ACID CHELATE,) 133 MG tablet Take 1 tablet by mouth daily.    No facility-administered encounter medications on file as of 10/13/2021.    Patient Active Problem List   Diagnosis Date Noted   Lumbar disc herniation with radiculopathy 08/13/2021   Anxiety and depression 08/13/2021   Iron deficiency anemia 04/13/2021   History of vitamin D deficiency    Ulnar neuropathy at elbow, right 06/05/2018   Leg edema, left 02/23/2018   Angular cheilitis 02/23/2018   Hyperlipidemia associated with type 2 diabetes mellitus ([place]) 02/23/2018   Hypertension associated with diabetes ([place]) 02/23/2018   Diabetic 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 ([place]) 02/23/2018   Epistaxis 09/12/2017   Carpal tunnel syndrome 09/13/2016   Headache, chronic daily 09/13/2016   Acute  medial meniscus tear of left knee 08/10/2016   Primary osteoarthritis of left knee 08/10/2016   Tinnitus of both ears 08/10/2016   Hydradenitis 01/18/2016   Primary osteoarthritis of both first carpometacarpal joints 01/06/2016   Insomnia 11/24/2015   Microalbuminuria due to type 2 diabetes mellitus ([place]) 07/30/2015   Type 2 diabetes mellitus with  microalbuminuria, with long-term current use of insulin ([place]) 02/06/2007    Conditions to be addressed/monitored:  patient reports she has scheduled annual visit; advanced directive packet received  Care Plan : RN Care Manager Plan of Care  Updates made by Luretha Rued, RN since 10/13/2021 12:00 AM  Completed 10/13/2021   Problem: Chronic disease management and/or Care Coordination needs Resolved 10/13/2021  Priority: High     Goal: Development of plan of care for Chronic Disease Managment and/or care coordination needs Completed 10/13/2021  Start Date: 08/25/2021  Expected End Date: 09/29/2021  Priority: High  Note:   Goals met: case closed Current Barriers: 66 year old with history of diabetes, HTN, HLD. Lumbar disc herniation/ status post repair on 08/14/21. Jessica [name] reports she is doing well post procedure. She lives alone, but has very supportive family. She states she is walking with walker. She reports family comes to check on her at least 3 times/day. She has follow up appointment with surgeon scheduled for 09/02/21. She reports her daughter is going to take her to the appointment. She denies any questions or concerns. Jessica [name] does not want any disease management. AIC 6.4, which is improved from 7.6. She does not want to work on disease management. But would like to received advance directive packet. 10/13/21 RNCM spoke with Jessica [name] who reports she has received advance directive packet. Denies any questions. RNCM discussed case closure. Patient is in agreement.   No Advanced Directives in place  RNCM Clinical Goal(s):  Patient will  Verbalize understanding of advance directive process  through collaboration with RN Care manager, provider, and care team Verbalize receipt of advance directive packet  Interventions: 1:1 collaboration with primary care provider as needed Inter-disciplinary care team collaboration (see longitudinal plan of care) Evaluation of current treatment  plan related to  self management and patient's adherence to plan as established by provider Discussed fall prevention strategies and confirmed Reviewed after visit summary post repair of herniated disc on 08/14/21 and confirmed she has a follow up appointment scheduled Reviewed equipment and assess for any equipment needs  Advance directive Interventions  (10/13/21 Status:  Goal Met.)  Short Term Goal Confirmed with Ms. Washburn that she has received AD packet. She denies any questions Advised patient to discuss with provider as needed Provided education to patient and/or caregiver about advanced directives  Discussed plans with patient for ongoing care management follow up and provided patient with direct contact information for care management team  Patient Goals/Self-Care Activities: Take medications as prescribed   Attend all scheduled provider appointments Call pharmacy for medication refills 3-7 days in advance of running out of medications Call provider office for new concerns or questions  Discuss advance directive with your provider as needed Take notarized copy to you Primary care provider office once completed Review advance directive packet and contact your nurse case manager as needed   Plan: No further follow up required: Patient informed to contact primary care provider if any care coordination/care management needs in the future.  Thea Silversmith, RN, MSN, BSN, CCM Care Management Coordinator [facility] [phone]

## 2021-10-14 NOTE — Telephone Encounter (Signed)
Submitted for VOB for durolane

## 2021-10-15 ENCOUNTER — Inpatient Hospital Stay (HOSPITAL_BASED_OUTPATIENT_CLINIC_OR_DEPARTMENT_OTHER): Payer: No Typology Code available for payment source | Admitting: Family

## 2021-10-15 ENCOUNTER — Other Ambulatory Visit: Payer: Self-pay

## 2021-10-15 ENCOUNTER — Inpatient Hospital Stay: Payer: No Typology Code available for payment source | Attending: Hematology & Oncology

## 2021-10-15 ENCOUNTER — Telehealth: Payer: Self-pay | Admitting: *Deleted

## 2021-10-15 ENCOUNTER — Encounter: Payer: Self-pay | Admitting: Family

## 2021-10-15 VITALS — BP 123/57 | HR 71 | Temp 97.9°F | Resp 18 | Ht 59.0 in | Wt 204.8 lb

## 2021-10-15 DIAGNOSIS — Z886 Allergy status to analgesic agent status: Secondary | ICD-10-CM | POA: Insufficient documentation

## 2021-10-15 DIAGNOSIS — Z79899 Other long term (current) drug therapy: Secondary | ICD-10-CM | POA: Insufficient documentation

## 2021-10-15 DIAGNOSIS — D509 Iron deficiency anemia, unspecified: Secondary | ICD-10-CM | POA: Diagnosis present

## 2021-10-15 DIAGNOSIS — E538 Deficiency of other specified B group vitamins: Secondary | ICD-10-CM

## 2021-10-15 DIAGNOSIS — R5383 Other fatigue: Secondary | ICD-10-CM | POA: Diagnosis not present

## 2021-10-15 LAB — CBC WITH DIFFERENTIAL (CANCER CENTER ONLY)
Abs Immature Granulocytes: 0.03 10*3/uL (ref 0.00–0.07)
Basophils Absolute: 0.1 10*3/uL (ref 0.0–0.1)
Basophils Relative: 1 %
Eosinophils Absolute: 0.4 10*3/uL (ref 0.0–0.5)
Eosinophils Relative: 4 %
HCT: 38 % (ref 36.0–46.0)
Hemoglobin: 12.7 g/dL (ref 12.0–15.0)
Immature Granulocytes: 0 %
Lymphocytes Relative: 13 %
Lymphs Abs: 1.3 10*3/uL (ref 0.7–4.0)
MCH: 28 pg (ref 26.0–34.0)
MCHC: 33.4 g/dL (ref 30.0–36.0)
MCV: 83.7 fL (ref 80.0–100.0)
Monocytes Absolute: 0.5 10*3/uL (ref 0.1–1.0)
Monocytes Relative: 5 %
Neutro Abs: 7.7 10*3/uL (ref 1.7–7.7)
Neutrophils Relative %: 77 %
Platelet Count: 298 10*3/uL (ref 150–400)
RBC: 4.54 MIL/uL (ref 3.87–5.11)
RDW: 12.8 % (ref 11.5–15.5)
WBC Count: 10 10*3/uL (ref 4.0–10.5)
nRBC: 0 % (ref 0.0–0.2)

## 2021-10-15 LAB — IRON AND IRON BINDING CAPACITY (CC-WL,HP ONLY)
Iron: 41 ug/dL (ref 28–170)
Saturation Ratios: 10 % — ABNORMAL LOW (ref 10.4–31.8)
TIBC: 395 ug/dL (ref 250–450)
UIBC: 354 ug/dL (ref 148–442)

## 2021-10-15 LAB — RETICULOCYTES
Immature Retic Fract: 11.8 % (ref 2.3–15.9)
RBC.: 4.53 MIL/uL (ref 3.87–5.11)
Retic Count, Absolute: 89.2 10*3/uL (ref 19.0–186.0)
Retic Ct Pct: 2 % (ref 0.4–3.1)

## 2021-10-15 LAB — FERRITIN: Ferritin: 114 ng/mL (ref 11–307)

## 2021-10-15 LAB — VITAMIN B12: Vitamin B-12: 389 pg/mL (ref 180–914)

## 2021-10-15 NOTE — Telephone Encounter (Signed)
Per 10/15/21 los - gave upcoming appointments - confirmed 

## 2021-10-15 NOTE — Progress Notes (Signed)
Hematology and Oncology Follow Up Visit  Jessica [name] [facility] [id number] Aug 17, 1955 66 y.o. 10/15/2021   Principle Diagnosis:  Iron deficiency anemia   Current Therapy:        IV iron as indicated    Interim History:  Jessica [name] is here today for follow-up. She is feeling fatigued at this time.  She had laminectomy and decompression of L2 and L3 on Labor Day and is recovering.  No blood loss, bruising or petechiae noted.  No fever, chills, n/v, cough, rash, dizziness, SOB, chest pain, palpitations, abdominal pain or changes in bowel or bladder habits.  No swelling, numbness or tingling in her extremities at this time.  No falls or syncope reported. She is ambulating with a cane for added support.  Appetite and hydration are good. Her weight is stable at 204 lbs.   ECOG Performance Status: 1 - Symptomatic but completely ambulatory  Medications:  Allergies as of 10/15/2021       Reactions   Aspirin    Bruising with long term use        Medication List        Accurate as of October 15, 2021  8:47 AM. If you have any questions, ask your nurse or doctor.          amLODipine 5 MG tablet Commonly known as: NORVASC Take 1 tablet (5 mg total) by mouth daily.   EYE VITAMINS PO Take 1 tablet by mouth daily.   lisinopril-hydrochlorothiazide 10-12.5 MG tablet Commonly known as: ZESTORETIC Take 1 tablet by mouth daily.   magnesium (amino acid chelate) 133 MG tablet Take 1 tablet by mouth daily.   Melatonin 5 MG Chew Chew 5 mg by mouth at bedtime as needed (for sleep).   metFORMIN 1000 MG tablet Commonly known as: GLUCOPHAGE Take 1 tablet (1,000 mg total) by mouth 2 (two) times daily with a meal.   methocarbamol 500 MG tablet Commonly known as: ROBAXIN Take 1.5 tablets (750 mg total) by mouth 4 (four) times daily.   Ozempic (1 MG/DOSE) 4 MG/3ML Sopn Generic drug: Semaglutide (1 MG/DOSE) Inject 1 mg under the skin as directed once a week.   rosuvastatin 40 MG  tablet Commonly known as: CRESTOR TAKE 1 TABLET (40 MG TOTAL) BY MOUTH AT BEDTIME.   Semglee (yfgn) 100 UNIT/ML Pen Generic drug: insulin glargine-yfgn INJECT 50-80 UNITS INTO THE SKIN AT BEDTIME. INCREASE BY 2 UNITS EVERY 3 DAYS UNTIL FBS CONSISTENTLY 90-130 THEN STAY AT THAT DOSE. What changed:  how much to take how to take this when to take this additional instructions   sertraline 100 MG tablet Commonly known as: ZOLOFT Take 1 tablet (100 mg total) by mouth daily.        Allergies:  Allergies  Allergen Reactions   Aspirin     Bruising with long term use    Past Medical History, Surgical history, Social history, and Family History were reviewed and updated.  Review of Systems: All other 10 point review of systems is negative.   Physical Exam:  height is 4\' 11"  (1.499 m) and weight is 204 lb 12.8 oz (92.9 kg). Her oral temperature is 97.9 F (36.6 C). Her blood pressure is 123/57 (abnormal) and her pulse is 71. Her respiration is 18 and oxygen saturation is 100%.   Wt Readings from Last 3 Encounters:  10/15/21 204 lb 12.8 oz (92.9 kg)  10/04/21 208 lb 0.6 oz (94.4 kg)  08/13/21 210 lb (95.3 kg)    Ocular: Sclerae  unicteric, pupils equal, round and reactive to light Ear-nose-throat: Oropharynx clear, dentition fair Lymphatic: No cervical or supraclavicular adenopathy Lungs no rales or rhonchi, good excursion bilaterally Heart regular rate and rhythm, no murmur appreciated Abd soft, nontender, positive bowel sounds MSK no focal spinal tenderness, no joint edema Neuro: non-focal, well-oriented, appropriate affect Breasts: Deferred   Lab Results  Component Value Date   WBC 10.0 10/15/2021   HGB 12.7 10/15/2021   HCT 38.0 10/15/2021   MCV 83.7 10/15/2021   PLT 298 10/15/2021   Lab Results  Component Value Date   FERRITIN 202 06/09/2021   IRON 57 06/09/2021   TIBC 414 06/09/2021   UIBC 357 06/09/2021   IRONPCTSAT 14 06/09/2021   Lab Results  Component  Value Date   RETICCTPCT 2.0 10/15/2021   RBC 4.54 10/15/2021   RBC 4.53 10/15/2021   No results found for: "KPAFRELGTCHN", "LAMBDASER", "KAPLAMBRATIO" No results found for: "IGGSERUM", "IGA", "IGMSERUM" No results found for: "TOTALPROTELP", "ALBUMINELP", "A1GS", "A2GS", "BETS", "BETA2SER", "GAMS", "MSPIKE", "SPEI"   Chemistry      Component Value Date/Time   NA 136 10/04/2021 0909   K 4.4 10/04/2021 0909   CL 94 (L) 10/04/2021 0909   CO2 30 10/04/2021 0909   BUN 18 10/04/2021 0909   CREATININE 0.71 10/04/2021 0909      Component Value Date/Time   CALCIUM 9.8 10/04/2021 0909   ALKPHOS 82 04/12/2021 0802   AST 17 10/04/2021 0909   AST 16 04/12/2021 0802   ALT 19 10/04/2021 0909   ALT 14 04/12/2021 0802   BILITOT 0.6 10/04/2021 0909   BILITOT 0.5 04/12/2021 0802       Impression and Plan: Jessica [name] is a very pleasant 66 yo caucasian female with long history of iron deficiency anemia.  Iron studies are pending.  Lab only in 3 months, follow-up in 6 months.   Eileen Stanford, NP 7/28/20238:47 AM

## 2021-10-21 ENCOUNTER — Inpatient Hospital Stay: Payer: No Typology Code available for payment source | Attending: Hematology & Oncology

## 2021-10-21 ENCOUNTER — Telehealth: Payer: Self-pay

## 2021-10-21 VITALS — BP 105/56 | HR 67 | Temp 98.2°F | Resp 18

## 2021-10-21 DIAGNOSIS — Z79899 Other long term (current) drug therapy: Secondary | ICD-10-CM | POA: Diagnosis not present

## 2021-10-21 DIAGNOSIS — Z886 Allergy status to analgesic agent status: Secondary | ICD-10-CM | POA: Insufficient documentation

## 2021-10-21 DIAGNOSIS — R5383 Other fatigue: Secondary | ICD-10-CM | POA: Diagnosis not present

## 2021-10-21 DIAGNOSIS — D509 Iron deficiency anemia, unspecified: Secondary | ICD-10-CM | POA: Diagnosis present

## 2021-10-21 MED ORDER — SODIUM CHLORIDE 0.9 % IV SOLN: Freq: Once | INTRAVENOUS | Status: AC

## 2021-10-21 MED ORDER — SODIUM CHLORIDE 0.9 % IV SOLN
300.0000 mg | Freq: Once | INTRAVENOUS | Status: AC
  Administered 2021-10-21: 300 mg via INTRAVENOUS
  Filled 2021-10-21: qty 300

## 2021-10-21 NOTE — Patient Instructions (Signed)

## 2021-10-21 NOTE — Telephone Encounter (Signed)
Talked with Morrie Sheldon at [facility] and initiated PA for Monovisc, bilateral knee. Patient will have a $50.00 Co-pay Pending PA# [phone]

## 2021-10-26 ENCOUNTER — Telehealth: Payer: Self-pay

## 2021-10-26 DIAGNOSIS — M25562 Pain in left knee: Secondary | ICD-10-CM

## 2021-10-26 DIAGNOSIS — M25561 Pain in right knee: Secondary | ICD-10-CM

## 2021-10-26 NOTE — Telephone Encounter (Signed)
Left VM for patient to CB to schedule for bilateral gel injections with Dr. August Saucer.

## 2021-10-28 ENCOUNTER — Inpatient Hospital Stay: Payer: No Typology Code available for payment source

## 2021-10-28 VITALS — BP 123/68 | HR 61 | Temp 98.7°F

## 2021-10-28 DIAGNOSIS — D509 Iron deficiency anemia, unspecified: Secondary | ICD-10-CM

## 2021-10-28 MED ORDER — SODIUM CHLORIDE 0.9 % IV SOLN: Freq: Once | INTRAVENOUS | Status: AC

## 2021-10-28 MED ORDER — SODIUM CHLORIDE 0.9 % IV SOLN
300.0000 mg | Freq: Once | INTRAVENOUS | Status: AC
  Administered 2021-10-28: 300 mg via INTRAVENOUS
  Filled 2021-10-28: qty 300

## 2021-10-28 NOTE — Patient Instructions (Signed)

## 2021-11-10 ENCOUNTER — Ambulatory Visit (INDEPENDENT_AMBULATORY_CARE_PROVIDER_SITE_OTHER): Payer: No Typology Code available for payment source | Admitting: Orthopedic Surgery

## 2021-11-10 ENCOUNTER — Encounter: Payer: Self-pay | Admitting: Orthopedic Surgery

## 2021-11-10 DIAGNOSIS — M1711 Unilateral primary osteoarthritis, right knee: Secondary | ICD-10-CM

## 2021-11-10 DIAGNOSIS — M170 Bilateral primary osteoarthritis of knee: Secondary | ICD-10-CM

## 2021-11-10 MED ORDER — HYALURONAN 88 MG/4ML IX SOSY
88.0000 mg | PREFILLED_SYRINGE | INTRA_ARTICULAR | Status: AC | PRN
  Administered 2021-11-10: 88 mg via INTRA_ARTICULAR

## 2021-11-10 MED ORDER — LIDOCAINE HCL 1 % IJ SOLN
5.0000 mL | INTRAMUSCULAR | Status: AC | PRN
  Administered 2021-11-10: 5 mL

## 2021-11-10 NOTE — Progress Notes (Signed)
   Procedure Note  Patient: Jessica [name]             Date of Birth: 08-Mar-1956           MRN: [id number]             Visit Date: 11/10/2021  Procedures: Visit Diagnoses:  1. Primary osteoarthritis of right knee   2. Primary osteoarthritis of both knees     Large Joint Inj: bilateral knee on 11/10/2021 12:21 PM Indications: pain, joint swelling and diagnostic evaluation Details: 18 G 1.5 in needle, superolateral approach  Arthrogram: No  Medications (Right): 5 mL lidocaine 1 %; 88 mg Hyaluronan 88 MG/4ML Medications (Left): 5 mL lidocaine 1 %; 88 mg Hyaluronan 88 MG/4ML Outcome: tolerated well, no immediate complications Procedure, treatment alternatives, risks and benefits explained, specific risks discussed. Consent was given by the patient. Immediately prior to procedure a time out was called to verify the correct patient, procedure, equipment, support staff and site/side marked as required. Patient was prepped and draped in the usual sterile fashion.    Lot #[id number]

## 2021-11-19 ENCOUNTER — Other Ambulatory Visit (HOSPITAL_BASED_OUTPATIENT_CLINIC_OR_DEPARTMENT_OTHER): Payer: Self-pay

## 2021-11-19 ENCOUNTER — Encounter: Payer: Self-pay | Admitting: Family Medicine

## 2021-11-19 ENCOUNTER — Ambulatory Visit (INDEPENDENT_AMBULATORY_CARE_PROVIDER_SITE_OTHER): Payer: No Typology Code available for payment source | Admitting: Family Medicine

## 2021-11-19 VITALS — BP 129/60 | HR 70 | Resp 20 | Ht 59.0 in | Wt 200.1 lb

## 2021-11-19 DIAGNOSIS — E1129 Type 2 diabetes mellitus with other diabetic kidney complication: Secondary | ICD-10-CM

## 2021-11-19 DIAGNOSIS — Z23 Encounter for immunization: Secondary | ICD-10-CM

## 2021-11-19 DIAGNOSIS — E1169 Type 2 diabetes mellitus with other specified complication: Secondary | ICD-10-CM

## 2021-11-19 DIAGNOSIS — E785 Hyperlipidemia, unspecified: Secondary | ICD-10-CM

## 2021-11-19 DIAGNOSIS — Z794 Long term (current) use of insulin: Secondary | ICD-10-CM

## 2021-11-19 DIAGNOSIS — R809 Proteinuria, unspecified: Secondary | ICD-10-CM

## 2021-11-19 DIAGNOSIS — Z0000 Encounter for general adult medical examination without abnormal findings: Secondary | ICD-10-CM | POA: Diagnosis not present

## 2021-11-19 DIAGNOSIS — F330 Major depressive disorder, recurrent, mild: Secondary | ICD-10-CM

## 2021-11-19 DIAGNOSIS — Z1211 Encounter for screening for malignant neoplasm of colon: Secondary | ICD-10-CM

## 2021-11-19 MED ORDER — ROSUVASTATIN CALCIUM 40 MG PO TABS
ORAL_TABLET | Freq: Every day | ORAL | 3 refills | Status: DC
  Filled 2021-11-19: qty 90, fill #0
  Filled 2022-01-10: qty 90, 90d supply, fill #0
  Filled 2022-05-02: qty 90, 90d supply, fill #1
  Filled 2022-09-07: qty 90, 90d supply, fill #2

## 2021-11-19 MED ORDER — SERTRALINE HCL 100 MG PO TABS
100.0000 mg | ORAL_TABLET | Freq: Every day | ORAL | 3 refills | Status: DC
  Filled 2021-11-19: qty 90, 90d supply, fill #0
  Filled 2022-02-28: qty 90, 90d supply, fill #1
  Filled 2022-06-14 – 2022-06-24 (×2): qty 90, 90d supply, fill #2
  Filled 2022-09-21: qty 90, 90d supply, fill #3

## 2021-11-19 MED ORDER — LISINOPRIL-HYDROCHLOROTHIAZIDE 10-12.5 MG PO TABS
1.0000 | ORAL_TABLET | Freq: Every day | ORAL | 3 refills | Status: DC
Start: 2021-11-19 — End: 2022-06-29
  Filled 2021-11-19 – 2022-01-10 (×2): qty 90, 90d supply, fill #0
  Filled 2022-05-02: qty 90, 90d supply, fill #1

## 2021-11-19 NOTE — Progress Notes (Signed)
Complete physical exam  Patient: Jessica [name]   DOB: February 21, 1956   66 y.o. Female  MRN: [id number]  Subjective:    Chief Complaint  Patient presents with   Annual Exam    Jessica [name] is a 66 y.o. female who presents today for a complete physical exam. She reports consuming a general diet. The patient does not participate in regular exercise at present. She generally feels fairly well. She reports sleeping fairly well. She is using Zqu[place]  She does not have additional problems to discuss today.   Lab Results  Component Value Date   HGBA1C 7.0 10/04/2021     Most recent fall risk assessment:    10/04/2021    9:18 AM  Fall Risk   Falls in the past year? 1  Number falls in past yr: 1  Injury with Fall? 1  Risk for fall due to : History of fall(s)  Follow up Falls evaluation completed     Most recent depression screenings:    10/04/2021    9:18 AM 10/04/2021    8:40 AM  PHQ 2/9 Scores  PHQ - 2 Score 5 5  PHQ- 9 Score 15 15        Patient Care Team: Christen Butter, NP as PCP - General (Nurse Practitioner)   Outpatient Medications Prior to Visit  Medication Sig   amLODipine (NORVASC) 5 MG tablet Take 1 tablet (5 mg total) by mouth daily.   insulin glargine-yfgn (SEMGLEE) 100 UNIT/ML Pen INJECT 50-80 UNITS INTO THE SKIN AT BEDTIME. INCREASE BY 2 UNITS EVERY 3 DAYS UNTIL FBS CONSISTENTLY 90-130 THEN STAY AT THAT DOSE. (Patient taking differently: Inject 50-80 Units into the skin daily. Per sliding scale per patient)   Melatonin 5 MG CHEW Chew 5 mg by mouth at bedtime as needed (for sleep).   metFORMIN (GLUCOPHAGE) 1000 MG tablet Take 1 tablet (1,000 mg total) by mouth 2 (two) times daily with a meal.   methocarbamol (ROBAXIN) 500 MG tablet Take 1.5 tablets (750 mg total) by mouth 4 (four) times daily.   Multiple Vitamins-Minerals (EYE VITAMINS PO) Take 1 tablet by mouth daily.   Semaglutide, 1 MG/DOSE, 4 MG/3ML SOPN Inject 1 mg under the skin as directed  once a week.   Specialty Vitamins Products (MAGNESIUM, AMINO ACID CHELATE,) 133 MG tablet Take 1 tablet by mouth daily.   [DISCONTINUED] lisinopril-hydrochlorothiazide (ZESTORETIC) 10-12.5 MG tablet Take 1 tablet by mouth daily.   [DISCONTINUED] rosuvastatin (CRESTOR) 40 MG tablet TAKE 1 TABLET (40 MG TOTAL) BY MOUTH AT BEDTIME.   [DISCONTINUED] sertraline (ZOLOFT) 100 MG tablet Take 1 tablet (100 mg total) by mouth daily.   No facility-administered medications prior to visit.    ROS        Objective:     BP 129/60 (BP Location: Left Arm, Cuff Size: Normal)   Pulse 70   Resp 20   Ht 4\' 11"  (1.499 m)   Wt 200 lb 1.9 oz (90.8 kg)   SpO2 99%   BMI 40.42 kg/m    Physical Exam   No results found for any visits on 11/19/21.     Assessment & Plan:    Routine Health Maintenance and Physical Exam  Immunization History  Administered Date(s) Administered   Hepatitis B 11/27/2015, 04/07/2017, 11/06/2017   Hepatitis B, ped/adol 11/27/2015, 04/07/2017, 11/06/2017   Influenza Whole 03/21/2005   Influenza,inj,Quad PF,6+ Mos 12/18/2019, 11/19/2021   Influenza-Unspecified 01/19/2019, 12/19/2020   PFIZER(Purple Top)SARS-COV-2 Vaccination 05/31/2019, 06/21/2019, 02/27/2020  Pneumococcal Polysaccharide-23 06/21/2007, 09/11/2012   Td 03/21/2005   Tdap 01/12/2010, 04/05/2021   Zoster Recombinat (Shingrix) 09/14/2020, 04/05/2021    Health Maintenance  Topic Date Due   COLONOSCOPY (Pts 45-56yrs Insurance coverage will need to be confirmed)  Never done   PAP SMEAR-Modifier  12/12/2009   OPHTHALMOLOGY EXAM  09/03/2021   COVID-19 Vaccine (4 - Pfizer risk series) 02/23/2022 (Originally 04/23/2020)   FOOT EXAM  04/05/2022   HEMOGLOBIN A1C  04/06/2022   Diabetic kidney evaluation - GFR measurement  10/05/2022   Diabetic kidney evaluation - Urine ACR  10/05/2022   MAMMOGRAM  10/15/2022   TETANUS/TDAP  04/06/2031   INFLUENZA VACCINE  Completed   Hepatitis C Screening  Completed   HIV  Screening  Completed   Zoster Vaccines- Shingrix  Completed   HPV VACCINES  Aged Out    Discussed health benefits of physical activity, and encouraged her to engage in regular exercise appropriate for her age and condition.  Problem List Items Addressed This Visit       Endocrine   Type 2 diabetes mellitus with microalbuminuria, with long-term current use of insulin (HCC)   Relevant Medications   lisinopril-hydrochlorothiazide (ZESTORETIC) 10-12.5 MG tablet   rosuvastatin (CRESTOR) 40 MG tablet   Hyperlipidemia associated with type 2 diabetes mellitus (HCC)   Relevant Medications   lisinopril-hydrochlorothiazide (ZESTORETIC) 10-12.5 MG tablet   rosuvastatin (CRESTOR) 40 MG tablet   Other Visit Diagnoses     Wellness examination    -  Primary   Need for influenza vaccination       Relevant Orders   Flu Vaccine QUAD 6+ mos PF IM (Fluarix Quad PF) (Completed)   Screen for colon cancer       Relevant Orders   Cologuard   Mild episode of recurrent major depressive disorder (HCC)       Relevant Medications   sertraline (ZOLOFT) 100 MG tablet      Keep up a regular exercise program and make sure you are eating a healthy diet Try to eat 4 servings of dairy a day, or if you are lactose intolerant take a calcium with vitamin D daily.  Your vaccines are up to date.  Declined Pap smear but strongly encouraged her to think about it.     No follow-ups on file.     Nani Gasser, MD

## 2021-11-29 ENCOUNTER — Other Ambulatory Visit (HOSPITAL_BASED_OUTPATIENT_CLINIC_OR_DEPARTMENT_OTHER): Payer: Self-pay

## 2021-12-06 ENCOUNTER — Encounter: Payer: Self-pay | Admitting: Medical-Surgical

## 2021-12-21 ENCOUNTER — Other Ambulatory Visit (HOSPITAL_BASED_OUTPATIENT_CLINIC_OR_DEPARTMENT_OTHER): Payer: Self-pay

## 2021-12-23 ENCOUNTER — Ambulatory Visit (INDEPENDENT_AMBULATORY_CARE_PROVIDER_SITE_OTHER): Payer: No Typology Code available for payment source

## 2021-12-23 ENCOUNTER — Encounter: Payer: Self-pay | Admitting: Physician Assistant

## 2021-12-23 ENCOUNTER — Other Ambulatory Visit: Payer: Self-pay | Admitting: Medical-Surgical

## 2021-12-23 ENCOUNTER — Ambulatory Visit (INDEPENDENT_AMBULATORY_CARE_PROVIDER_SITE_OTHER): Payer: No Typology Code available for payment source | Admitting: Physician Assistant

## 2021-12-23 VITALS — BP 114/80 | HR 66 | Ht 59.0 in | Wt 195.0 lb

## 2021-12-23 DIAGNOSIS — M79645 Pain in left finger(s): Secondary | ICD-10-CM | POA: Insufficient documentation

## 2021-12-23 DIAGNOSIS — M7989 Other specified soft tissue disorders: Secondary | ICD-10-CM

## 2021-12-23 DIAGNOSIS — Z1231 Encounter for screening mammogram for malignant neoplasm of breast: Secondary | ICD-10-CM

## 2021-12-23 NOTE — Patient Instructions (Signed)
Get labs Get xray Continue voltaren gel, icing, tylenol arthritis and buddy taping for now.

## 2021-12-23 NOTE — Progress Notes (Signed)
There is some arthritis of the fifth tip of pinky finger but nothing noted over the MCP or PIP joints where the swelling is. No dislocation. Lets see if labs show anything.

## 2021-12-23 NOTE — Progress Notes (Signed)
   Acute Office Visit  Subjective:     Patient ID: Jessica [name], female    DOB: 1955/03/28, 66 y.o.   MRN: [id number]  Chief Complaint  Patient presents with   Hand Pain    Finger pain     HPI Patient is in today for left pinky finger swelling and pain for the last 2 weeks. Pt does have T2DM and known osteoarthritis. She has been using tylenol arthritis and voltaren gel with some benefit. She has been buddy taping her finger together. She denies any known injury. She has never had anything like this happen before.   ROS  See HPI.     Objective:    BP 114/80   Pulse 66   Ht 4' 11" (1.499 m)   Wt 195 lb (88.5 kg)   SpO2 99%   BMI 39.39 kg/m  BP Readings from Last 3 Encounters:  12/23/21 114/80  11/19/21 129/60  10/28/21 123/68   Wt Readings from Last 3 Encounters:  12/23/21 195 lb (88.5 kg)  11/19/21 200 lb 1.9 oz (90.8 kg)  10/15/21 204 lb 12.8 oz (92.9 kg)      Physical Exam Left pinky:   Swollen MCP through PIP. Slightly warm to touch. Not able to fully extend due to pain and flex due to swelling.  Pain to palpation over MCP and PIP.        Assessment & Plan:  Marland KitchenMarland KitchenZakara was seen today for hand pain.  Diagnoses and all orders for this visit:  Swelling of finger of left hand -     DG Finger Little Left; Future -     BASIC METABOLIC PANEL WITH GFR -     Uric acid -     Sed Rate (ESR) -     CBC w/Diff/Platelet  Finger pain, left -     DG Finger Little Left; Future -     BASIC METABOLIC PANEL WITH GFR -     Uric acid -     Sed Rate (ESR) -     CBC w/Diff/Platelet   Unclear etiology of swelling and pain today Does not appear like infection, will order CBC to look at white blood count Pt denies any injury to finger Will check uric acid for gout Will get xray to look for arthritis For now continue with voltaren and tylenol arthritis.  Ok to buddy tape finger for support.  Consider sports medicine referral.   Iran Planas, PA-C

## 2021-12-24 LAB — CBC WITH DIFFERENTIAL/PLATELET
Absolute Monocytes: 439 cells/uL (ref 200–950)
Basophils Absolute: 43 cells/uL (ref 0–200)
Basophils Relative: 0.5 %
Eosinophils Absolute: 370 cells/uL (ref 15–500)
Eosinophils Relative: 4.3 %
HCT: 39.9 % (ref 35.0–45.0)
Hemoglobin: 13.5 g/dL (ref 11.7–15.5)
Lymphs Abs: 1230 cells/uL (ref 850–3900)
MCH: 29 pg (ref 27.0–33.0)
MCHC: 33.8 g/dL (ref 32.0–36.0)
MCV: 85.6 fL (ref 80.0–100.0)
MPV: 9.2 fL (ref 7.5–12.5)
Monocytes Relative: 5.1 %
Neutro Abs: 6519 cells/uL (ref 1500–7800)
Neutrophils Relative %: 75.8 %
Platelets: 329 10*3/uL (ref 140–400)
RBC: 4.66 10*6/uL (ref 3.80–5.10)
RDW: 13.3 % (ref 11.0–15.0)
Total Lymphocyte: 14.3 %
WBC: 8.6 10*3/uL (ref 3.8–10.8)

## 2021-12-24 LAB — BASIC METABOLIC PANEL WITH GFR
BUN: 12 mg/dL (ref 7–25)
CO2: 28 mmol/L (ref 20–32)
Calcium: 9.5 mg/dL (ref 8.6–10.4)
Chloride: 95 mmol/L — ABNORMAL LOW (ref 98–110)
Creat: 0.76 mg/dL (ref 0.50–1.05)
Glucose, Bld: 117 mg/dL — ABNORMAL HIGH (ref 65–99)
Potassium: 4.2 mmol/L (ref 3.5–5.3)
Sodium: 134 mmol/L — ABNORMAL LOW (ref 135–146)
eGFR: 87 mL/min/{1.73_m2} (ref >=60)

## 2021-12-24 LAB — SEDIMENTATION RATE: Sed Rate: 25 mm/h (ref 0–30)

## 2021-12-24 LAB — URIC ACID: Uric Acid, Serum: 5.1 mg/dL (ref 2.5–7.0)

## 2021-12-27 NOTE — Progress Notes (Signed)
Uric acid normal. No gout.  No signs of any infection with normal WBC. Inflammation rate overall normal.  Sodium just a little low. Increase salt just a tad.   How is your finger?

## 2021-12-28 ENCOUNTER — Encounter: Payer: Self-pay | Admitting: Physician Assistant

## 2021-12-29 LAB — COLOGUARD: COLOGUARD: NEGATIVE

## 2021-12-30 NOTE — Progress Notes (Signed)
Great news! Your Cologuard test is negative.  Recommend repeat colon cancer screening in 3 years.

## 2022-01-10 ENCOUNTER — Other Ambulatory Visit (HOSPITAL_BASED_OUTPATIENT_CLINIC_OR_DEPARTMENT_OTHER): Payer: Self-pay

## 2022-01-12 ENCOUNTER — Ambulatory Visit (INDEPENDENT_AMBULATORY_CARE_PROVIDER_SITE_OTHER): Payer: No Typology Code available for payment source

## 2022-01-12 DIAGNOSIS — Z1231 Encounter for screening mammogram for malignant neoplasm of breast: Secondary | ICD-10-CM | POA: Diagnosis not present

## 2022-01-13 ENCOUNTER — Other Ambulatory Visit (HOSPITAL_BASED_OUTPATIENT_CLINIC_OR_DEPARTMENT_OTHER): Payer: Self-pay

## 2022-01-13 ENCOUNTER — Ambulatory Visit (INDEPENDENT_AMBULATORY_CARE_PROVIDER_SITE_OTHER): Payer: No Typology Code available for payment source | Admitting: Sports Medicine

## 2022-01-13 DIAGNOSIS — M79645 Pain in left finger(s): Secondary | ICD-10-CM | POA: Diagnosis not present

## 2022-01-13 MED ORDER — MELOXICAM 15 MG PO TABS
ORAL_TABLET | ORAL | 3 refills | Status: DC
  Filled 2022-01-13: qty 30, 30d supply, fill #0
  Filled 2022-02-28: qty 30, 30d supply, fill #1
  Filled 2022-06-16 – 2022-06-24 (×2): qty 30, 30d supply, fill #2
  Filled 2022-09-07: qty 30, 30d supply, fill #3

## 2022-01-13 NOTE — Assessment & Plan Note (Signed)
Jessica [name] returns, she is a very pleasant 65 year old female, she has had several weeks of pain left fifth PIP. On exam she is moderately swollen. She is taking some Tylenol, but has not taken any oral NSAIDs. X-rays did show some marginal erosions versus subchondral cystic changes. We will add meloxicam, she can take this with her Tylenol, home hand therapy, return to see me in 3 to 4 weeks and if insufficient improvement we will do an injection. Due to the findings on her x-ray I would have a low threshold for pulling the trigger for a rheumatoid work-up.

## 2022-01-13 NOTE — Progress Notes (Signed)
    Procedures performed today:    I personally reviewed [name] x-rays, she has widespread degenerative changes in the hand, she does have at [name] PIP mild degenerative changes with marginal erosions versus subchondral cystic changes.  Independent interpretation of notes and tests performed by another provider:   None.  Brief History, Exam, Impression, and Recommendations:    Finger pain, left Jessica [name] returns, she is a very pleasant 66 year old female, she has had several weeks of pain left fifth PIP. On exam she is moderately swollen. She is taking some Tylenol, but has not taken any oral NSAIDs. X-rays did show some marginal erosions versus subchondral cystic changes. We will add meloxicam, she can take this with [name] Tylenol, home hand therapy, return to see me in 3 to 4 weeks and if insufficient improvement we will do an injection. Due to the findings on [name] x-ray I would have a low threshold for pulling the trigger for a rheumatoid work-up.    ____________________________________________ Jessica [name]. Dianah Field, M.D., ABFM., CAQSM., AME. Primary Care and Sports Medicine [facility]  Adjunct Professor of [place] [employer]  Risk manager

## 2022-01-14 ENCOUNTER — Other Ambulatory Visit (HOSPITAL_BASED_OUTPATIENT_CLINIC_OR_DEPARTMENT_OTHER): Payer: Self-pay

## 2022-01-14 ENCOUNTER — Inpatient Hospital Stay: Payer: No Typology Code available for payment source | Attending: Hematology & Oncology

## 2022-01-14 DIAGNOSIS — E538 Deficiency of other specified B group vitamins: Secondary | ICD-10-CM

## 2022-01-14 DIAGNOSIS — D509 Iron deficiency anemia, unspecified: Secondary | ICD-10-CM | POA: Insufficient documentation

## 2022-01-14 LAB — CBC WITH DIFFERENTIAL (CANCER CENTER ONLY)
Abs Immature Granulocytes: 0.03 10*3/uL (ref 0.00–0.07)
Basophils Absolute: 0 10*3/uL (ref 0.0–0.1)
Basophils Relative: 0 %
Eosinophils Absolute: 0.4 10*3/uL (ref 0.0–0.5)
Eosinophils Relative: 4 %
HCT: 38.5 % (ref 36.0–46.0)
Hemoglobin: 13 g/dL (ref 12.0–15.0)
Immature Granulocytes: 0 %
Lymphocytes Relative: 14 %
Lymphs Abs: 1.4 10*3/uL (ref 0.7–4.0)
MCH: 28.9 pg (ref 26.0–34.0)
MCHC: 33.8 g/dL (ref 30.0–36.0)
MCV: 85.6 fL (ref 80.0–100.0)
Monocytes Absolute: 0.5 10*3/uL (ref 0.1–1.0)
Monocytes Relative: 5 %
Neutro Abs: 7.7 10*3/uL (ref 1.7–7.7)
Neutrophils Relative %: 77 %
Platelet Count: 310 10*3/uL (ref 150–400)
RBC: 4.5 MIL/uL (ref 3.87–5.11)
RDW: 13.2 % (ref 11.5–15.5)
WBC Count: 10.1 10*3/uL (ref 4.0–10.5)
nRBC: 0 % (ref 0.0–0.2)

## 2022-01-14 LAB — IRON AND IRON BINDING CAPACITY (CC-WL,HP ONLY)
Iron: 63 ug/dL (ref 28–170)
Saturation Ratios: 16 % (ref 10.4–31.8)
TIBC: 395 ug/dL (ref 250–450)
UIBC: 332 ug/dL (ref 148–442)

## 2022-01-14 LAB — FERRITIN: Ferritin: 295 ng/mL (ref 11–307)

## 2022-01-14 LAB — RETICULOCYTES
Immature Retic Fract: 12.3 % (ref 2.3–15.9)
RBC.: 4.56 MIL/uL (ref 3.87–5.11)
Retic Count, Absolute: 102.6 10*3/uL (ref 19.0–186.0)
Retic Ct Pct: 2.3 % (ref 0.4–3.1)

## 2022-01-14 LAB — VITAMIN B12: Vitamin B-12: 322 pg/mL (ref 180–914)

## 2022-01-19 ENCOUNTER — Encounter: Payer: Self-pay | Admitting: Sports Medicine

## 2022-01-20 ENCOUNTER — Ambulatory Visit: Payer: No Typology Code available for payment source | Admitting: Sports Medicine

## 2022-01-20 ENCOUNTER — Ambulatory Visit (INDEPENDENT_AMBULATORY_CARE_PROVIDER_SITE_OTHER): Payer: No Typology Code available for payment source

## 2022-01-20 ENCOUNTER — Ambulatory Visit (INDEPENDENT_AMBULATORY_CARE_PROVIDER_SITE_OTHER): Payer: No Typology Code available for payment source | Admitting: Sports Medicine

## 2022-01-20 DIAGNOSIS — G5602 Carpal tunnel syndrome, left upper limb: Secondary | ICD-10-CM | POA: Diagnosis not present

## 2022-01-20 NOTE — Progress Notes (Addendum)
    Procedures performed today:    Procedure: Real-time Ultrasound Guided hydrodissection of the left median nerve at the carpal tunnel Device: Samsung [id number] Verbal informed consent obtained.  Time-out conducted.  Noted no overlying erythema, induration, or other signs of local infection.  Skin prepped in a sterile fashion.  Local anesthesia: Topical Ethyl chloride.  With sterile technique and under real time ultrasound guidance: Noted enlarged nerve, using a 25-gauge needle advanced into the carpal tunnel, taking care to avoid intraneural injection I injected medication both superficial to and deep to the median nerve freeing it from surrounding structures, I then redirected the needle deep and injected further medication around the flexor tendons deep within the carpal tunnel for a total of 1 cc kenalog 40, 5 cc 1% lidocaine without epinephrine. Completed without difficulty  Advised to call if fevers/chills, erythema, induration, drainage, or persistent bleeding.  Images permanently stored and available for review in PACS.  Impression: Technically successful ultrasound guided median nerve hydrodissection.  Independent interpretation of notes and tests performed by another provider:   None.  Brief History, Exam, Impression, and Recommendations:    Carpal tunnel syndrome Persistence of aching and pain left hand with numbness and tingling, positive Tinel's sign today. Today we did a carpal tunnel injection due to failure of conservative treatment. Return to see me in 6 weeks.  Of note there were some paresthesias from the elbow to the fifth finger concerning for a concurrent cubital tunnel syndrome, we can certainly target this at the follow-up visit if needed.  Chronic process with exacerbation and pharmacologic intervention  ____________________________________________ Gwen Her. Dianah Field, M.D., ABFM., CAQSM., AME. Primary Care and Sports Medicine [facility]  [facility]  Adjunct Professor of [place] [employer] [facility] [employer] of Medicine  Risk manager

## 2022-01-20 NOTE — Assessment & Plan Note (Addendum)
Persistence of aching and pain left hand with numbness and tingling, positive Tinel's sign today. Today we did a carpal tunnel injection due to failure of conservative treatment. Return to see me in 6 weeks.  Of note there were some paresthesias from the elbow to the fifth finger concerning for a concurrent cubital tunnel syndrome, we can certainly target this at the follow-up visit if needed.

## 2022-02-04 ENCOUNTER — Encounter: Payer: No Typology Code available for payment source | Admitting: Medical-Surgical

## 2022-02-04 ENCOUNTER — Encounter: Payer: Self-pay | Admitting: Medical-Surgical

## 2022-02-04 DIAGNOSIS — R809 Proteinuria, unspecified: Secondary | ICD-10-CM

## 2022-02-06 NOTE — Progress Notes (Signed)
Erroneous encounter

## 2022-02-28 ENCOUNTER — Encounter: Payer: Self-pay | Admitting: Family

## 2022-03-03 ENCOUNTER — Other Ambulatory Visit (HOSPITAL_BASED_OUTPATIENT_CLINIC_OR_DEPARTMENT_OTHER): Payer: Self-pay

## 2022-03-03 ENCOUNTER — Ambulatory Visit (INDEPENDENT_AMBULATORY_CARE_PROVIDER_SITE_OTHER): Payer: No Typology Code available for payment source | Admitting: Sports Medicine

## 2022-03-03 DIAGNOSIS — G5602 Carpal tunnel syndrome, left upper limb: Secondary | ICD-10-CM

## 2022-03-03 DIAGNOSIS — M180 Bilateral primary osteoarthritis of first carpometacarpal joints: Secondary | ICD-10-CM | POA: Diagnosis not present

## 2022-03-03 NOTE — Assessment & Plan Note (Signed)
CMC osteoarthritis, added CMC conditioning, she is not hurting enough to try injections, return to see me as needed for this.

## 2022-03-03 NOTE — Assessment & Plan Note (Signed)
Pleasant 66 year old female, we did a median nerve hydrodissection on the left at the last visit, she is doing really well, asymptomatic now. Return to see me as needed, continue stretches and nighttime splinting.

## 2022-03-03 NOTE — Progress Notes (Signed)
    Procedures performed today:    None.  Independent interpretation of notes and tests performed by another provider:   None.  Brief History, Exam, Impression, and Recommendations:    Carpal tunnel syndrome Pleasant 66 year old female, we did a median nerve hydrodissection on the left at the last visit, she is doing really well, asymptomatic now. Return to see me as needed, continue stretches and nighttime splinting.  Primary osteoarthritis of both first carpometacarpal joints CMC osteoarthritis, added CMC conditioning, she is not hurting enough to try injections, return to see me as needed for this.    ____________________________________________ Ihor Austin. Benjamin Stain, M.D., ABFM., CAQSM., AME. Primary Care and Sports Medicine [facility]  Adjunct Professor of Family Medicine  [place] [employer] [facility] [employer]  Restaurant manager, fast food

## 2022-03-05 ENCOUNTER — Encounter: Payer: Self-pay | Admitting: Family

## 2022-03-18 ENCOUNTER — Other Ambulatory Visit (HOSPITAL_BASED_OUTPATIENT_CLINIC_OR_DEPARTMENT_OTHER): Payer: Self-pay

## 2022-03-18 ENCOUNTER — Other Ambulatory Visit: Payer: Self-pay

## 2022-04-06 ENCOUNTER — Ambulatory Visit: Payer: No Typology Code available for payment source | Admitting: Medical-Surgical

## 2022-04-18 ENCOUNTER — Encounter: Payer: Self-pay | Admitting: Family

## 2022-04-18 ENCOUNTER — Inpatient Hospital Stay (HOSPITAL_BASED_OUTPATIENT_CLINIC_OR_DEPARTMENT_OTHER): Payer: 59 | Admitting: Family

## 2022-04-18 ENCOUNTER — Inpatient Hospital Stay: Payer: 59 | Attending: Hematology & Oncology

## 2022-04-18 ENCOUNTER — Other Ambulatory Visit: Payer: Self-pay

## 2022-04-18 VITALS — BP 125/65 | HR 68 | Temp 98.2°F | Resp 16 | Ht 59.0 in | Wt 182.4 lb

## 2022-04-18 DIAGNOSIS — R5383 Other fatigue: Secondary | ICD-10-CM | POA: Insufficient documentation

## 2022-04-18 DIAGNOSIS — E538 Deficiency of other specified B group vitamins: Secondary | ICD-10-CM

## 2022-04-18 DIAGNOSIS — D509 Iron deficiency anemia, unspecified: Secondary | ICD-10-CM

## 2022-04-18 LAB — CBC WITH DIFFERENTIAL (CANCER CENTER ONLY)
Abs Immature Granulocytes: 0.11 10*3/uL — ABNORMAL HIGH (ref 0.00–0.07)
Basophils Absolute: 0 10*3/uL (ref 0.0–0.1)
Basophils Relative: 0 %
Eosinophils Absolute: 0.4 10*3/uL (ref 0.0–0.5)
Eosinophils Relative: 5 %
HCT: 40.5 % (ref 36.0–46.0)
Hemoglobin: 13.6 g/dL (ref 12.0–15.0)
Immature Granulocytes: 1 %
Lymphocytes Relative: 13 %
Lymphs Abs: 1.2 10*3/uL (ref 0.7–4.0)
MCH: 28.9 pg (ref 26.0–34.0)
MCHC: 33.6 g/dL (ref 30.0–36.0)
MCV: 86 fL (ref 80.0–100.0)
Monocytes Absolute: 0.4 10*3/uL (ref 0.1–1.0)
Monocytes Relative: 5 %
Neutro Abs: 6.8 10*3/uL (ref 1.7–7.7)
Neutrophils Relative %: 76 %
Platelet Count: 291 10*3/uL (ref 150–400)
RBC: 4.71 MIL/uL (ref 3.87–5.11)
RDW: 12.9 % (ref 11.5–15.5)
WBC Count: 9 10*3/uL (ref 4.0–10.5)
nRBC: 0 % (ref 0.0–0.2)

## 2022-04-18 LAB — RETICULOCYTES
Immature Retic Fract: 11.6 % (ref 2.3–15.9)
RBC.: 4.72 MIL/uL (ref 3.87–5.11)
Retic Count, Absolute: 89.7 10*3/uL (ref 19.0–186.0)
Retic Ct Pct: 1.9 % (ref 0.4–3.1)

## 2022-04-18 LAB — FERRITIN: Ferritin: 324 ng/mL — ABNORMAL HIGH (ref 11–307)

## 2022-04-18 LAB — IRON AND IRON BINDING CAPACITY (CC-WL,HP ONLY)
Iron: 44 ug/dL (ref 28–170)
Saturation Ratios: 10 % — ABNORMAL LOW (ref 10.4–31.8)
TIBC: 427 ug/dL (ref 250–450)
UIBC: 383 ug/dL (ref 148–442)

## 2022-04-18 LAB — VITAMIN B12: Vitamin B-12: 370 pg/mL (ref 180–914)

## 2022-04-18 NOTE — Progress Notes (Signed)
Hematology and Oncology Follow Up Visit  Fran Neiswonger [facility] [id number] 05/21/1955 67 y.o. 04/18/2022   Principle Diagnosis:  Iron deficiency anemia   Current Therapy:        IV iron as indicated    Interim History:  Ms. Jessica [name] is here today for follow-up. She is feeling fatigued. She is staying busy with work and also keeps 3 of her grandchildren.  No blood loss noted. No bruising or petechiae.  No fever, chills, n/v, cough, rash, dizziness, SOB, chest pain, palpitations, abdominal pain or changes in bowel or bladder habits.  No swelling in her extremities.  She states that her toes are cold most of the time. She has made her PCP aware and they check periodically.  No falls or syncope reported.  Appetite comes and goes. Hydration is good. Weight is 182 lbs.   ECOG Performance Status: 1 - Symptomatic but completely ambulatory  Medications:  Allergies as of 04/18/2022       Reactions   Aspirin    Bruising with long term use        Medication List        Accurate as of April 18, 2022  8:50 AM. If you have any questions, ask your nurse or doctor.          amLODipine 5 MG tablet Commonly known as: NORVASC Take 1 tablet (5 mg total) by mouth daily.   EYE VITAMINS PO Take 1 tablet by mouth daily.   lisinopril-hydrochlorothiazide 10-12.5 MG tablet Commonly known as: ZESTORETIC Take 1 tablet by mouth daily.   magnesium (amino acid chelate) 133 MG tablet Take 1 tablet by mouth daily.   Melatonin 5 MG Chew Chew 5 mg by mouth at bedtime as needed (for sleep).   meloxicam 15 MG tablet Commonly known as: MOBIC One tablet by mouth every 24 hours with a meal for 2 weeks, then once every 24 hours as needed for pain.   metFORMIN 1000 MG tablet Commonly known as: GLUCOPHAGE Take 1 tablet (1,000 mg total) by mouth 2 (two) times daily with a meal.   Ozempic (1 MG/DOSE) 4 MG/3ML Sopn Generic drug: Semaglutide (1 MG/DOSE) Inject 1 mg under the skin as directed  once a week.   rosuvastatin 40 MG tablet Commonly known as: CRESTOR TAKE 1 TABLET (40 MG TOTAL) BY MOUTH AT BEDTIME.   Semglee (yfgn) 100 UNIT/ML Pen Generic drug: insulin glargine-yfgn INJECT 50-80 UNITS INTO THE SKIN AT BEDTIME. INCREASE BY 2 UNITS EVERY 3 DAYS UNTIL FBS CONSISTENTLY 90-130 THEN STAY AT THAT DOSE. What changed:  how much to take how to take this when to take this additional instructions   sertraline 100 MG tablet Commonly known as: ZOLOFT Take 1 tablet (100 mg total) by mouth daily.        Allergies:  Allergies  Allergen Reactions   Aspirin     Bruising with long term use    Past Medical History, Surgical history, Social history, and Family History were reviewed and updated.  Review of Systems: All other 10 point review of systems is negative.   Physical Exam:  height is 4\' 11"  (1.499 m) and weight is 182 lb 6.4 oz (82.7 kg). Her temperature is 98.2 F (36.8 C). Her blood pressure is 125/65 and her pulse is 68. Her respiration is 16 and oxygen saturation is 100%.   Wt Readings from Last 3 Encounters:  04/18/22 182 lb 6.4 oz (82.7 kg)  12/23/21 195 lb (88.5 kg)  11/19/21 200 lb  1.9 oz (90.8 kg)    Ocular: Sclerae unicteric, pupils equal, round and reactive to light Ear-nose-throat: Oropharynx clear, dentition fair Lymphatic: No cervical or supraclavicular adenopathy Lungs no rales or rhonchi, good excursion bilaterally Heart regular rate and rhythm, no murmur appreciated Abd soft, nontender, positive bowel sounds MSK no focal spinal tenderness, no joint edema Neuro: non-focal, well-oriented, appropriate affect Breasts: Deferred  Lab Results  Component Value Date   WBC 9.0 04/18/2022   HGB 13.6 04/18/2022   HCT 40.5 04/18/2022   MCV 86.0 04/18/2022   PLT 291 04/18/2022   Lab Results  Component Value Date   FERRITIN 295 01/14/2022   IRON 63 01/14/2022   TIBC 395 01/14/2022   UIBC 332 01/14/2022   IRONPCTSAT 16 01/14/2022   Lab  Results  Component Value Date   RETICCTPCT 1.9 04/18/2022   RBC 4.72 04/18/2022   No results found for: "KPAFRELGTCHN", "LAMBDASER", "KAPLAMBRATIO" No results found for: "IGGSERUM", "IGA", "IGMSERUM" No results found for: "TOTALPROTELP", "ALBUMINELP", "A1GS", "A2GS", "BETS", "BETA2SER", "GAMS", "MSPIKE", "SPEI"   Chemistry      Component Value Date/Time   NA 134 (L) 12/23/2021 0000   K 4.2 12/23/2021 0000   CL 95 (L) 12/23/2021 0000   CO2 28 12/23/2021 0000   BUN 12 12/23/2021 0000   CREATININE 0.76 12/23/2021 0000      Component Value Date/Time   CALCIUM 9.5 12/23/2021 0000   ALKPHOS 82 04/12/2021 0802   AST 17 10/04/2021 0909   AST 16 04/12/2021 0802   ALT 19 10/04/2021 0909   ALT 14 04/12/2021 0802   BILITOT 0.6 10/04/2021 0909   BILITOT 0.5 04/12/2021 0802       Impression and Plan: Ms. [name] is a very pleasant 67 yo caucasian female with long history of iron deficiency anemia.  Iron studies are pending.  Lab only in 3 months, follow-up in 6 months.   Lottie Dawson, NP 1/29/20248:50 AM

## 2022-04-27 ENCOUNTER — Other Ambulatory Visit (HOSPITAL_BASED_OUTPATIENT_CLINIC_OR_DEPARTMENT_OTHER): Payer: Self-pay

## 2022-04-27 ENCOUNTER — Other Ambulatory Visit: Payer: Self-pay | Admitting: Pharmacist

## 2022-04-27 ENCOUNTER — Encounter: Payer: Self-pay | Admitting: Family

## 2022-04-27 ENCOUNTER — Inpatient Hospital Stay: Payer: 59 | Attending: Hematology & Oncology

## 2022-04-27 VITALS — BP 107/59 | HR 66 | Temp 97.8°F | Resp 16 | Ht 59.0 in | Wt 182.0 lb

## 2022-04-27 DIAGNOSIS — D509 Iron deficiency anemia, unspecified: Secondary | ICD-10-CM | POA: Diagnosis not present

## 2022-04-27 DIAGNOSIS — R5383 Other fatigue: Secondary | ICD-10-CM | POA: Diagnosis not present

## 2022-04-27 MED ORDER — SODIUM CHLORIDE 0.9 % IV SOLN: Freq: Once | INTRAVENOUS | Status: AC

## 2022-04-27 MED ORDER — SODIUM CHLORIDE 0.9 % IV SOLN
300.0000 mg | Freq: Once | INTRAVENOUS | Status: AC
  Administered 2022-04-27: 300 mg via INTRAVENOUS
  Filled 2022-04-27: qty 300

## 2022-04-27 NOTE — Patient Instructions (Signed)

## 2022-05-04 ENCOUNTER — Inpatient Hospital Stay: Payer: 59

## 2022-05-04 VITALS — BP 104/59 | HR 62 | Temp 97.8°F | Resp 18

## 2022-05-04 DIAGNOSIS — R5383 Other fatigue: Secondary | ICD-10-CM | POA: Diagnosis not present

## 2022-05-04 DIAGNOSIS — D509 Iron deficiency anemia, unspecified: Secondary | ICD-10-CM

## 2022-05-04 MED ORDER — SODIUM CHLORIDE 0.9% FLUSH: 3.0000 mL | Freq: Once | INTRAVENOUS | Status: DC | PRN

## 2022-05-04 MED ORDER — SODIUM CHLORIDE 0.9 % IV SOLN: Freq: Once | INTRAVENOUS | Status: AC

## 2022-05-04 MED ORDER — SODIUM CHLORIDE 0.9 % IV SOLN
300.0000 mg | Freq: Once | INTRAVENOUS | Status: AC
  Administered 2022-05-04: 300 mg via INTRAVENOUS
  Filled 2022-05-04: qty 300

## 2022-05-04 MED ORDER — SODIUM CHLORIDE 0.9% FLUSH: 10.0000 mL | Freq: Once | INTRAVENOUS | Status: DC | PRN

## 2022-05-04 NOTE — Patient Instructions (Signed)

## 2022-05-11 ENCOUNTER — Inpatient Hospital Stay: Payer: 59

## 2022-05-11 VITALS — BP 117/63 | HR 59 | Temp 97.7°F | Resp 18

## 2022-05-11 DIAGNOSIS — D509 Iron deficiency anemia, unspecified: Secondary | ICD-10-CM

## 2022-05-11 DIAGNOSIS — R5383 Other fatigue: Secondary | ICD-10-CM | POA: Diagnosis not present

## 2022-05-11 MED ORDER — SODIUM CHLORIDE 0.9 % IV SOLN: Freq: Once | INTRAVENOUS | Status: AC

## 2022-05-11 MED ORDER — SODIUM CHLORIDE 0.9% FLUSH: 10.0000 mL | Freq: Once | INTRAVENOUS | Status: DC | PRN

## 2022-05-11 MED ORDER — SODIUM CHLORIDE 0.9 % IV SOLN
300.0000 mg | Freq: Once | INTRAVENOUS | Status: AC
  Administered 2022-05-11: 300 mg via INTRAVENOUS
  Filled 2022-05-11: qty 300

## 2022-05-11 MED ORDER — SODIUM CHLORIDE 0.9% FLUSH: 3.0000 mL | Freq: Once | INTRAVENOUS | Status: DC | PRN

## 2022-05-11 NOTE — Patient Instructions (Signed)

## 2022-05-19 ENCOUNTER — Ambulatory Visit: Payer: 59 | Admitting: Sports Medicine

## 2022-05-23 ENCOUNTER — Ambulatory Visit (INDEPENDENT_AMBULATORY_CARE_PROVIDER_SITE_OTHER): Payer: 59 | Admitting: Sports Medicine

## 2022-05-23 ENCOUNTER — Ambulatory Visit (INDEPENDENT_AMBULATORY_CARE_PROVIDER_SITE_OTHER): Payer: 59

## 2022-05-23 ENCOUNTER — Encounter: Payer: Self-pay | Admitting: Sports Medicine

## 2022-05-23 VITALS — Wt 163.0 lb

## 2022-05-23 DIAGNOSIS — M7989 Other specified soft tissue disorders: Secondary | ICD-10-CM

## 2022-05-23 MED ORDER — PREDNISONE 50 MG PO TABS: ORAL_TABLET | ORAL | 0 refills | Status: DC

## 2022-05-23 NOTE — Progress Notes (Signed)
    Procedures performed today:    I buddy taped [name] third and fourth fingers today.  Independent interpretation of notes and tests performed by another provider:   None.  Brief History, Exam, Impression, and Recommendations:    Swelling of left middle finger Pleasant 67 year old female, 2 weeks ago [name] washing machine, she ended up wringing out all of [name] wet clothes by hand. Now she has swelling, pain left third finger dorsally from the MCP to the PIP as well as on the volar aspect from the A1 pulley to the PIP. She does have passive motion and strength albeit with pain. As inflamed as this is we will immobilize, add some steroids and x-rays. I like to see [name] back in maybe 3 to 4 weeks for further evaluation.    ____________________________________________ Jessica [name]. Dianah Field, M.D., ABFM., CAQSM., AME. Primary Care and Sports Medicine [facility]  Adjunct Professor of [place] [employer] [facility] of Medicine  Risk manager

## 2022-05-23 NOTE — Assessment & Plan Note (Signed)
Pleasant 67 year old female, 2 weeks ago her washing machine, she ended up wringing out all of her wet clothes by hand. Now she has swelling, pain left third finger dorsally from the MCP to the PIP as well as on the volar aspect from the A1 pulley to the PIP. She does have passive motion and strength albeit with pain. As inflamed as this is we will immobilize, add some steroids and x-rays. I like to see her back in maybe 3 to 4 weeks for further evaluation.

## 2022-05-26 ENCOUNTER — Encounter: Payer: Self-pay | Admitting: Radiology

## 2022-05-31 ENCOUNTER — Emergency Department (HOSPITAL_COMMUNITY)
Admission: EM | Admit: 2022-05-31 | Discharge: 2022-05-31 | Disposition: A | Discharge status: Home / Self Care | Payer: PRIVATE HEALTH INSURANCE | Attending: Emergency Medicine | Admitting: Emergency Medicine

## 2022-05-31 ENCOUNTER — Other Ambulatory Visit: Payer: Self-pay

## 2022-05-31 ENCOUNTER — Emergency Department (HOSPITAL_COMMUNITY): Payer: 59

## 2022-05-31 ENCOUNTER — Encounter: Payer: Self-pay | Admitting: Family

## 2022-05-31 DIAGNOSIS — R109 Unspecified abdominal pain: Secondary | ICD-10-CM | POA: Diagnosis present

## 2022-05-31 DIAGNOSIS — Z794 Long term (current) use of insulin: Secondary | ICD-10-CM | POA: Diagnosis not present

## 2022-05-31 DIAGNOSIS — I10 Essential (primary) hypertension: Secondary | ICD-10-CM | POA: Diagnosis not present

## 2022-05-31 DIAGNOSIS — D72829 Elevated white blood cell count, unspecified: Secondary | ICD-10-CM | POA: Diagnosis not present

## 2022-05-31 DIAGNOSIS — M546 Pain in thoracic spine: Secondary | ICD-10-CM | POA: Insufficient documentation

## 2022-05-31 DIAGNOSIS — Z79899 Other long term (current) drug therapy: Secondary | ICD-10-CM | POA: Diagnosis not present

## 2022-05-31 DIAGNOSIS — E119 Type 2 diabetes mellitus without complications: Secondary | ICD-10-CM | POA: Diagnosis not present

## 2022-05-31 DIAGNOSIS — W19XXXA Unspecified fall, initial encounter: Secondary | ICD-10-CM

## 2022-05-31 DIAGNOSIS — W01198A Fall on same level from slipping, tripping and stumbling with subsequent striking against other object, initial encounter: Secondary | ICD-10-CM | POA: Diagnosis not present

## 2022-05-31 DIAGNOSIS — Y990 Civilian activity done for income or pay: Secondary | ICD-10-CM | POA: Insufficient documentation

## 2022-05-31 LAB — CBC
HCT: 42 % (ref 36.0–46.0)
Hemoglobin: 13.8 g/dL (ref 12.0–15.0)
MCH: 29.2 pg (ref 26.0–34.0)
MCHC: 32.9 g/dL (ref 30.0–36.0)
MCV: 88.8 fL (ref 80.0–100.0)
Platelets: 277 10*3/uL (ref 150–400)
RBC: 4.73 MIL/uL (ref 3.87–5.11)
RDW: 13.4 % (ref 11.5–15.5)
WBC: 11.3 10*3/uL — ABNORMAL HIGH (ref 4.0–10.5)
nRBC: 0 % (ref 0.0–0.2)

## 2022-05-31 LAB — URINALYSIS, ROUTINE W REFLEX MICROSCOPIC
Bilirubin Urine: NEGATIVE
Glucose, UA: NEGATIVE mg/dL
Hgb urine dipstick: NEGATIVE
Ketones, ur: NEGATIVE mg/dL
Leukocytes,Ua: NEGATIVE
Nitrite: NEGATIVE
Protein, ur: NEGATIVE mg/dL
Specific Gravity, Urine: 1.014 (ref 1.005–1.030)
pH: 7 (ref 5.0–8.0)

## 2022-05-31 LAB — COMPREHENSIVE METABOLIC PANEL
ALT: 29 U/L (ref 0–44)
AST: 28 U/L (ref 15–41)
Albumin: 3.4 g/dL — ABNORMAL LOW (ref 3.5–5.0)
Alkaline Phosphatase: 68 U/L (ref 38–126)
Anion gap: 15 (ref 5–15)
BUN: 19 mg/dL (ref 8–23)
CO2: 28 mmol/L (ref 22–32)
Calcium: 9 mg/dL (ref 8.9–10.3)
Chloride: 91 mmol/L — ABNORMAL LOW (ref 98–111)
Creatinine, Ser: 0.79 mg/dL (ref 0.44–1.00)
GFR, Estimated: >60 mL/min (ref >60)
Glucose, Bld: 121 mg/dL — ABNORMAL HIGH (ref 70–99)
Potassium: 3.9 mmol/L (ref 3.5–5.1)
Sodium: 134 mmol/L — ABNORMAL LOW (ref 135–145)
Total Bilirubin: 0.4 mg/dL (ref 0.3–1.2)
Total Protein: 6.3 g/dL — ABNORMAL LOW (ref 6.5–8.1)

## 2022-05-31 MED ORDER — IOHEXOL 350 MG/ML SOLN
75.0000 mL | Freq: Once | INTRAVENOUS | Status: AC | PRN
  Administered 2022-05-31: 75 mL via INTRAVENOUS

## 2022-05-31 MED ORDER — LIDOCAINE 5 % EX PTCH
1.0000 | MEDICATED_PATCH | CUTANEOUS | Status: DC
  Administered 2022-05-31: 1 via TRANSDERMAL
  Filled 2022-05-31: qty 1

## 2022-05-31 NOTE — ED Triage Notes (Signed)
Pt. Stated, I fell last night when I checked in for work , my foot got caught on a plastic mat on the floor . My whole left side hurts . I worked all night before checking in.

## 2022-05-31 NOTE — Discharge Instructions (Signed)
Take ibuprofen and Tylenol as needed for pain and soreness.  Return to the emergency department for any new or worsening symptoms of concern.

## 2022-05-31 NOTE — ED Provider Notes (Signed)
[facility] Provider Note   CSN: [id number] Arrival date & time: 05/31/22  [id number]     History  Chief Complaint  Patient presents with   Fall   Hip Pain   left side pain    Jessica [name] is a 67 y.o. female.   Fall  Hip Pain  Patient presents after a fall.  Medical history includes T2DM, arthritis, HLD, HTN, anemia, anxiety, depression.  Patient works in the hospital during third shift.  Early during her shift last night, she had a mechanical fall.  She describes backing up from her desk and tripping on a mat that was on the floor.  As she fell backwards, she struck her left flank against a desk.  She then struck the floor.  She was able to get up on her own power and finish her shift.  Throughout her shift, she has had persistent pain in the area of her posterior left flank.  She did take Tylenol for analgesia.  She denies any other areas of new discomfort.       Home Medications Prior to Admission medications   Medication Sig Start Date End Date Taking? Authorizing Provider  amLODipine (NORVASC) 5 MG tablet Take 1 tablet (5 mg total) by mouth daily. 10/04/21   Samuel Bouche, NP  insulin glargine-yfgn (SEMGLEE) 100 UNIT/ML Pen INJECT 50-80 UNITS INTO THE SKIN AT BEDTIME. INCREASE BY 2 UNITS EVERY 3 DAYS UNTIL FBS CONSISTENTLY 90-130 THEN STAY AT THAT DOSE. Patient taking differently: Inject 50-80 Units into the skin daily. Per sliding scale per patient 05/25/21   Samuel Bouche, NP  lisinopril-hydrochlorothiazide (ZESTORETIC) 10-12.5 MG tablet Take 1 tablet by mouth daily. 11/19/21   Hali Marry, MD  Melatonin 5 MG CHEW Chew 5 mg by mouth at bedtime as needed (for sleep).    [provider]  meloxicam (MOBIC) 15 MG tablet One tablet by mouth every 24 hours with a meal for 2 weeks, then once every 24 hours as needed for pain. 01/13/22   Silverio Decamp, MD  metFORMIN (GLUCOPHAGE) 1000 MG tablet Take 1 tablet  (1,000 mg total) by mouth 2 (two) times daily with a meal. 04/23/21 06/01/22  Samuel Bouche, NP  Multiple Vitamins-Minerals (EYE VITAMINS PO) Take 1 tablet by mouth daily.    [provider]  predniSONE (DELTASONE) 50 MG tablet One tab PO daily for 5 days. 05/23/22   Silverio Decamp, MD  rosuvastatin (CRESTOR) 40 MG tablet TAKE 1 TABLET (40 MG TOTAL) BY MOUTH AT BEDTIME. 11/19/21 11/19/22  Hali Marry, MD  Semaglutide, 1 MG/DOSE, 4 MG/3ML SOPN Inject 1 mg under the skin as directed once a week. 10/04/21   Samuel Bouche, NP  sertraline (ZOLOFT) 100 MG tablet Take 1 tablet (100 mg total) by mouth daily. 11/19/21 11/19/22  Hali Marry, MD  Specialty Vitamins Products (MAGNESIUM, AMINO ACID CHELATE,) 133 MG tablet Take 1 tablet by mouth daily.    [provider]      Allergies    Aspirin    Review of Systems   Review of Systems  Genitourinary:  Positive for flank pain.  Musculoskeletal:  Positive for back pain.  All other systems reviewed and are negative.   Physical Exam Updated Vital Signs BP 118/60   Pulse 65   Temp 97.8 F (36.6 C)   Resp 16   SpO2 98%  Physical Exam Vitals and nursing note reviewed.  Constitutional:  General: She is not in acute distress.    Appearance: Normal appearance. She is well-developed. She is not ill-appearing, toxic-appearing or diaphoretic.  HENT:     Head: Normocephalic and atraumatic.     Right Ear: External ear normal.     Left Ear: External ear normal.     Nose: Nose normal.     Mouth/Throat:     Mouth: Mucous membranes are moist.  Eyes:     Extraocular Movements: Extraocular movements intact.     Conjunctiva/sclera: Conjunctivae normal.  Cardiovascular:     Rate and Rhythm: Normal rate and regular rhythm.  Pulmonary:     Effort: Pulmonary effort is normal. No respiratory distress.  Abdominal:     Palpations: Abdomen is soft.     Tenderness: There is abdominal tenderness (Left flank).  Musculoskeletal:         General: Tenderness (Left thoracic area) present. No swelling or deformity. Normal range of motion.     Cervical back: Normal range of motion and neck supple.  Skin:    General: Skin is warm and dry.     Coloration: Skin is not jaundiced or pale.  Neurological:     General: No focal deficit present.     Mental Status: She is alert and oriented to person, place, and time.     Cranial Nerves: No cranial nerve deficit.     Sensory: No sensory deficit.     Motor: No weakness.     Coordination: Coordination normal.  Psychiatric:        Mood and Affect: Mood normal.        Behavior: Behavior normal.        Thought Content: Thought content normal.        Judgment: Judgment normal.     ED Results / Procedures / Treatments   Labs (all labs ordered are listed, but only abnormal results are displayed) Labs Reviewed  COMPREHENSIVE METABOLIC PANEL - Abnormal; Notable for the following components:      Result Value   Sodium 134 (*)    Chloride 91 (*)    Glucose, Bld 121 (*)    Total Protein 6.3 (*)    Albumin 3.4 (*)    All other components within normal limits  CBC - Abnormal; Notable for the following components:   WBC 11.3 (*)    All other components within normal limits  URINALYSIS, ROUTINE W REFLEX MICROSCOPIC    EKG None  Radiology CT T-SPINE NO CHARGE  Result Date: 05/31/2022 CLINICAL DATA:  Fall with left-sided hip pain EXAM: CT Thoracic Spine with contrast TECHNIQUE: Multiplanar CT images of the thoracic spine were reconstructed from contemporary CT of the Chest. RADIATION DOSE REDUCTION: This exam was performed according to the departmental dose-optimization program which includes automated exposure control, adjustment of the mA and/or kV according to patient size and/or use of iterative reconstruction technique. CONTRAST:  None additional COMPARISON:  None Available. FINDINGS: Alignment: Negative for listhesis.  No traumatic malalignment Vertebrae: No acute fracture or  aggressive bone lesion. Paraspinal and other soft tissues: Reported separately. Disc levels: Spondylosis with bridging osteophytes at T4-T10. No notable facet spurring. L1-2 degeneration as described on dedicated report. IMPRESSION: Negative for thoracic spine fracture or subluxation. Electronically Signed   By: Jorje Guild M.D.   On: 05/31/2022 11:49   CT L-SPINE NO CHARGE  Result Date: 05/31/2022 CLINICAL DATA:  Fall with back pain EXAM: CT LUMBAR SPINE WITHOUT CONTRAST TECHNIQUE: Multidetector CT imaging of the lumbar spine  was performed without intravenous contrast administration. Multiplanar CT image reconstructions were also generated. RADIATION DOSE REDUCTION: This exam was performed according to the departmental dose-optimization program which includes automated exposure control, adjustment of the mA and/or kV according to patient size and/or use of iterative reconstruction technique. COMPARISON:  Radiography 08/14/2021.  MRI 08/13/2021 FINDINGS: Segmentation: Same numbering terminology is used as was employed with the MRI of 08/13/2021. L5 is sacralized by this terminology. Alignment: Mild scoliotic curvature convex to the left. 3 mm degenerative anterolisthesis L4-5. 3 mm degenerative retrolisthesis L1-2. Vertebrae: No acute fracture or focal bone lesion. Paraspinal and other soft tissues: Small nonobstructing renal stone on the left. Aortic atherosclerosis. Disc levels: L1-2: 3 mm retrolisthesis. Mild bulging of the disc. Mild narrowing of the lateral recesses and foramina but without definite neural compression. L2-3: Mild bulging of the disc.  No compressive stenosis. L3-4: Mild bulging of the disc.  No compressive stenosis. L4-5: Chronic facet arthropathy with 3 mm of anterolisthesis. This could worsen with standing or flexion. Disc degeneration with vacuum phenomenon and bulging of the disc. Mild narrowing of the lateral recesses and foramina but without visible neural compression. The facet  arthritis could be a cause of low back pain. L5-S1: Transitional level. No canal or foraminal stenosis. Some facet osteoarthritis. Bilateral sacroiliac osteoarthritis is noted. IMPRESSION: 1. Same numbering terminology is used as was employed with the MRI of 08/13/2021. L5 is transitional by this terminology. 2. No acute or traumatic finding. 3. Chronic non-compressive degenerative changes at L1-2, L2-3 and L3-4 as outlined above. 4. At L4-5, there is chronic facet arthropathy with 3 mm of anterolisthesis. This could worsen with standing or flexion. Disc degeneration with vacuum phenomenon and bulging of the disc. Mild narrowing of the lateral recesses and foramina but without visible neural compression. The facet arthritis could be a cause of low back pain. 5. Bilateral sacroiliac osteoarthritis. Aortic Atherosclerosis (ICD10-I70.0). Electronically Signed   By: Nelson Chimes M.D.   On: 05/31/2022 11:39    Procedures Procedures    Medications Ordered in ED Medications  lidocaine (LIDODERM) 5 % 1 patch (1 patch Transdermal Patch Applied 05/31/22 0846)  iohexol (OMNIPAQUE) 350 MG/ML injection 75 mL (75 mLs Intravenous Contrast Given 05/31/22 1103)    ED Course/ Medical Decision Making/ A&P                             Medical Decision Making Amount and/or Complexity of Data Reviewed Labs: ordered. Radiology: ordered.  Risk Prescription drug management.   This patient presents to the ED for concern of fall, this involves an extensive number of treatment options, and is a complaint that carries with it a high risk of complications and morbidity.  The differential diagnosis includes acute injuries   Co morbidities that complicate the patient evaluation  T2DM, arthritis, HLD, HTN, anemia, anxiety, depression   Additional history obtained:  Additional history obtained from N/A External records from outside source obtained and reviewed including EMR   Lab Tests:  I Ordered, and  personally interpreted labs.  The pertinent results include: Normal kidney function, normal electrolytes, mild leukocytosis, normal hemoglobin, no evidence of hematuria   Imaging Studies ordered:  I ordered imaging studies including CT of chest, abdomen, pelvis, T-spine, L-spine I independently visualized and interpreted imaging which showed no acute findings.  Chronic facet arthritis of lower back. I agree with the radiologist interpretation   Cardiac Monitoring: / EKG:  The patient was  maintained on a cardiac monitor.  I personally viewed and interpreted the cardiac monitored which showed an underlying rhythm of: Sinus rhythm  Problem List / ED Course / Critical interventions / Medication management  Patient presents after mechanical fall last night.  During this fall, she struck her left flank and back on a desk corner.  She then struck the floor.  She has had ongoing left flank pain throughout the night.  Patient is well-appearing on arrival.  Area of pain and tenderness is located left flank and left posterior thoracic cage.  No bruising is present.  Patient declines any analgesia at this time.  CT scan was ordered to assess for acute injuries from her recent fall.  CT imaging did not show any acute findings.  Laboratory workup was notable only for a mild leukocytosis.  Patient was informed of results.  She does request discharge home at this time.  She does plan on working tonight.  Patient was discharged in stable condition. I ordered medication including lidocaine patch for analgesia Reevaluation of the patient after these medicines showed that the patient stayed the same I have reviewed the patients home medicines and have made adjustments as needed   Social Determinants of Health:  Has PCP         Final Clinical Impression(s) / ED Diagnoses Final diagnoses:  Fall, initial encounter    Rx / DC Orders ED Discharge Orders     None         Godfrey Pick,  MD 05/31/22 1328

## 2022-06-03 ENCOUNTER — Encounter: Payer: Self-pay | Admitting: Family

## 2022-06-03 ENCOUNTER — Other Ambulatory Visit (HOSPITAL_BASED_OUTPATIENT_CLINIC_OR_DEPARTMENT_OTHER): Payer: Self-pay

## 2022-06-09 ENCOUNTER — Encounter: Payer: Self-pay | Admitting: Family

## 2022-06-14 ENCOUNTER — Ambulatory Visit: Payer: 59 | Admitting: Sports Medicine

## 2022-06-14 ENCOUNTER — Other Ambulatory Visit: Payer: Self-pay | Admitting: Medical-Surgical

## 2022-06-14 DIAGNOSIS — R809 Proteinuria, unspecified: Secondary | ICD-10-CM

## 2022-06-15 ENCOUNTER — Other Ambulatory Visit (HOSPITAL_BASED_OUTPATIENT_CLINIC_OR_DEPARTMENT_OTHER): Payer: Self-pay

## 2022-06-15 ENCOUNTER — Other Ambulatory Visit: Payer: Self-pay

## 2022-06-16 ENCOUNTER — Other Ambulatory Visit (HOSPITAL_BASED_OUTPATIENT_CLINIC_OR_DEPARTMENT_OTHER): Payer: Self-pay

## 2022-06-16 ENCOUNTER — Other Ambulatory Visit: Payer: Self-pay

## 2022-06-16 ENCOUNTER — Other Ambulatory Visit: Payer: Self-pay | Admitting: Medical-Surgical

## 2022-06-16 DIAGNOSIS — E1159 Type 2 diabetes mellitus with other circulatory complications: Secondary | ICD-10-CM

## 2022-06-16 MED ORDER — AMLODIPINE BESYLATE 5 MG PO TABS
5.0000 mg | ORAL_TABLET | Freq: Every day | ORAL | 0 refills | Status: DC
  Filled 2022-06-16 – 2022-06-24 (×2): qty 30, 30d supply, fill #0

## 2022-06-20 ENCOUNTER — Other Ambulatory Visit (HOSPITAL_BASED_OUTPATIENT_CLINIC_OR_DEPARTMENT_OTHER): Payer: Self-pay

## 2022-06-20 MED ORDER — METFORMIN HCL 1000 MG PO TABS
1000.0000 mg | ORAL_TABLET | Freq: Two times a day (BID) | ORAL | 0 refills | Status: DC
  Filled 2022-06-20 – 2022-06-24 (×2): qty 180, 90d supply, fill #0

## 2022-06-20 NOTE — Telephone Encounter (Signed)
Patient is scheduled for 06/29/22 at 8:10 for follow up on diabetes,thanks.

## 2022-06-24 ENCOUNTER — Ambulatory Visit (INDEPENDENT_AMBULATORY_CARE_PROVIDER_SITE_OTHER): Payer: 59 | Admitting: Sports Medicine

## 2022-06-24 ENCOUNTER — Other Ambulatory Visit (INDEPENDENT_AMBULATORY_CARE_PROVIDER_SITE_OTHER): Payer: 59

## 2022-06-24 ENCOUNTER — Encounter: Payer: Self-pay | Admitting: Family

## 2022-06-24 ENCOUNTER — Other Ambulatory Visit (HOSPITAL_BASED_OUTPATIENT_CLINIC_OR_DEPARTMENT_OTHER): Payer: Self-pay

## 2022-06-24 DIAGNOSIS — M7989 Other specified soft tissue disorders: Secondary | ICD-10-CM

## 2022-06-24 NOTE — Assessment & Plan Note (Addendum)
Jessica [name] returns, she is a very pleasant 67 year old female, several weeks ago she was cleaning close, she was wringing out all of the wet clothes by hand, started to have pain and swelling left third finger volar from proximal from the MCP to the PIP. We did some steroids, got some x-rays, x-rays showed only soft tissue swelling. Unfortunately she has not improved, on exam she has tenderness along the flexor tendon sheath, due to failure of conservative treatment we will proceed with third flexor tendon sheath injection with ultrasound guidance, return to see me in 4 weeks. Of note on the injection she had very dramatic flexor tendon sheath synovitis. We may consider testing for rheumatoid arthritis in the future.

## 2022-06-24 NOTE — Progress Notes (Signed)
    Procedures performed today:    Procedure: Real-time Ultrasound Guided injection of the left third flexor tendon sheath Device: Samsung [id number]  Verbal informed consent obtained.  Time-out conducted.  Noted no overlying erythema, induration, or other signs of local infection.  Skin prepped in a sterile fashion.  Local anesthesia: Topical Ethyl chloride.  With sterile technique and under real time ultrasound guidance: Noted dramatic tendon sheath synovitis, 1/2 cc lidocaine, 1/2 cc kenalog 40 injected easily. Completed without difficulty  Advised to call if fevers/chills, erythema, induration, drainage, or persistent bleeding.  Images permanently stored and available for review in PACS.  Impression: Technically successful ultrasound guided injection.  Independent interpretation of notes and tests performed by another provider:   None.  Brief History, Exam, Impression, and Recommendations:    Swelling of left middle finger Jessica [name] returns, she is a very pleasant 67 year old female, several weeks ago she was cleaning close, she was wringing out all of the wet clothes by hand, started to have pain and swelling left third finger volar from proximal from the MCP to the PIP. We did some steroids, got some x-rays, x-rays showed only soft tissue swelling. Unfortunately she has not improved, on exam she has tenderness along the flexor tendon sheath, due to failure of conservative treatment we will proceed with third flexor tendon sheath injection with ultrasound guidance, return to see me in 4 weeks. Of note on the injection she had very dramatic flexor tendon sheath synovitis. We may consider testing for rheumatoid arthritis in the future.    ____________________________________________ Ihor Austin. Benjamin Stain, M.D., ABFM., CAQSM., AME. Primary Care and Sports Medicine [facility]  Adjunct Professor of Family Medicine  [employer] [facility] [employer]  Restaurant manager, fast food

## 2022-06-29 ENCOUNTER — Other Ambulatory Visit (HOSPITAL_BASED_OUTPATIENT_CLINIC_OR_DEPARTMENT_OTHER): Payer: Self-pay

## 2022-06-29 ENCOUNTER — Ambulatory Visit: Payer: 59 | Admitting: Medical-Surgical

## 2022-06-29 ENCOUNTER — Encounter: Payer: Self-pay | Admitting: Medical-Surgical

## 2022-06-29 VITALS — BP 97/61 | HR 69 | Resp 20 | Ht 59.0 in | Wt 185.0 lb

## 2022-06-29 DIAGNOSIS — F419 Anxiety disorder, unspecified: Secondary | ICD-10-CM

## 2022-06-29 DIAGNOSIS — E1129 Type 2 diabetes mellitus with other diabetic kidney complication: Secondary | ICD-10-CM | POA: Diagnosis not present

## 2022-06-29 DIAGNOSIS — R809 Proteinuria, unspecified: Secondary | ICD-10-CM | POA: Diagnosis not present

## 2022-06-29 DIAGNOSIS — E1159 Type 2 diabetes mellitus with other circulatory complications: Secondary | ICD-10-CM | POA: Diagnosis not present

## 2022-06-29 DIAGNOSIS — Z794 Long term (current) use of insulin: Secondary | ICD-10-CM | POA: Diagnosis not present

## 2022-06-29 DIAGNOSIS — E785 Hyperlipidemia, unspecified: Secondary | ICD-10-CM | POA: Diagnosis not present

## 2022-06-29 DIAGNOSIS — I152 Hypertension secondary to endocrine disorders: Secondary | ICD-10-CM

## 2022-06-29 DIAGNOSIS — F32A Depression, unspecified: Secondary | ICD-10-CM

## 2022-06-29 DIAGNOSIS — E1169 Type 2 diabetes mellitus with other specified complication: Secondary | ICD-10-CM | POA: Diagnosis not present

## 2022-06-29 LAB — POCT GLYCOSYLATED HEMOGLOBIN (HGB A1C)
HbA1c, POC (controlled diabetic range): 6.7 % (ref 0.0–7.0)
Hemoglobin A1C: 6.7 % — AB (ref 4.0–5.6)

## 2022-06-29 MED ORDER — INSULIN GLARGINE-YFGN 100 UNIT/ML ~~LOC~~ SOPN
PEN_INJECTOR | SUBCUTANEOUS | 11 refills | Status: DC
  Filled 2022-06-29: qty 15, 20d supply, fill #0

## 2022-06-29 MED ORDER — LISINOPRIL-HYDROCHLOROTHIAZIDE 10-12.5 MG PO TABS
1.0000 | ORAL_TABLET | Freq: Every day | ORAL | 3 refills | Status: DC
  Filled 2022-06-29 – 2022-08-12 (×2): qty 90, 90d supply, fill #0
  Filled 2022-11-22: qty 90, 90d supply, fill #1
  Filled 2023-02-22: qty 90, 90d supply, fill #2
  Filled 2023-05-29: qty 90, 90d supply, fill #3

## 2022-06-29 MED ORDER — INSULIN GLARGINE-YFGN 100 UNIT/ML ~~LOC~~ SOPN: PEN_INJECTOR | SUBCUTANEOUS | 11 refills | Status: DC

## 2022-06-29 NOTE — Assessment & Plan Note (Signed)
Continues to have moderate anxiety depressive symptoms.  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  Taking Zoloft 100 mg daily but admits that she is using this as more of a as needed medication.  Most often takes it on nights where she has to be with the new employee.  Feels that the medication is working well for her and does not desire a change in therapy at this time.  Denies SI/HI.  Continue Zoloft as prescribed.

## 2022-06-29 NOTE — Assessment & Plan Note (Signed)
Taking amlodipine 5 mg daily and lisinopril-hydrochlorothiazide 10-12.5 mg daily, tolerating both medications well without side effects.  Occasionally monitoring blood pressure with readings at goal.  Following a low-sodium diet.  Not regularly exercising but tries to stay active whenever possible.  On exam, RRR, normal S1 and S2, no peripheral edema.  Lungs clear to auscultation with even unlabored respirations.  Today's blood pressure at goal.  No concerning symptoms.  Continue current medications.

## 2022-06-29 NOTE — Assessment & Plan Note (Signed)
Last hemoglobin A1c 7.0% 3 months ago.  Reports that she has been taking Semglee 20-30 units daily along with metformin 1000 mg twice daily and Ozempic 1 mg weekly.  Tolerating all medications well without side effects.  Has been monitoring her sugars and recently noted that they have crept up when she had the steroid injection.  Admits following a diabetic diet most of the time however she does have occasional indulgences.  Overdue for her eye exam but up-to-date on other preventative care.  Currently on statin and ACE therapy.  POCT hemoglobin A1c 6.7% today indicating good diabetic control.  Continue medications as prescribed.  We did briefly discuss increasing her Ozempic to 2 mg weekly however she would like to hold steady for now as there is some concern about her insurance changes when she turns 67 and goes on Medicare.  Continue monitoring glucose at home with a fasting levels of 120 or less.

## 2022-06-29 NOTE — Progress Notes (Signed)
        Established patient visit  History, exam, impression, and plan:  Hypertension associated with diabetes (HCC) Taking amlodipine 5 mg daily and lisinopril-hydrochlorothiazide 10-12.5 mg daily, tolerating both medications well without side effects.  Occasionally monitoring blood pressure with readings at goal.  Following a low-sodium diet.  Not regularly exercising but tries to stay active whenever possible.  On exam, RRR, normal S1 and S2, no peripheral edema.  Lungs clear to auscultation with even unlabored respirations.  Today's blood pressure at goal.  No concerning symptoms.  Continue current medications.  Hyperlipidemia associated with type 2 diabetes mellitus (HCC) Taking Crestor 40 mg daily, tolerating well without side effects.  Following a low-fat heart healthy diet.  Up-to-date on lipid check.  Continue Crestor as prescribed.  Type 2 diabetes mellitus with microalbuminuria, with long-term current use of insulin (HCC) Last hemoglobin A1c 7.0% 3 months ago.  Reports that she has been taking Semglee 20-30 units daily along with metformin 1000 mg twice daily and Ozempic 1 mg weekly.  Tolerating all medications well without side effects.  Has been monitoring her sugars and recently noted that they have crept up when she had the steroid injection.  Admits following a diabetic diet most of the time however she does have occasional indulgences.  Overdue for her eye exam but up-to-date on other preventative care.  Currently on statin and ACE therapy.  POCT hemoglobin A1c 6.7% today indicating good diabetic control.  Continue medications as prescribed.  We did briefly discuss increasing her Ozempic to 2 mg weekly however she would like to hold steady for now as there is some concern about her insurance changes when she turns 67 and goes on Medicare.  Continue monitoring glucose at home with a fasting levels of 120 or less.  Anxiety and depression Continues to have moderate anxiety depressive  symptoms.  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  Taking Zoloft 100 mg daily but admits that she is using this as more of a as needed medication.  Most often takes it on nights where she has to be with the new employee.  Feels that the medication is working well for her and does not desire a change in therapy at this time.  Denies SI/HI.  Continue Zoloft as prescribed.  Procedures performed this visit: None.  Return in about 6 months (around 12/29/2022) for DM/HTN/HLD follow up.  __________________________________ Thayer Ohm, DNP, APRN, FNP-BC Primary Care and Sports Medicine [facility]

## 2022-06-29 NOTE — Assessment & Plan Note (Signed)
Taking Crestor 40 mg daily, tolerating well without side effects.  Following a low-fat heart healthy diet.  Up-to-date on lipid check.  Continue Crestor as prescribed.

## 2022-07-07 ENCOUNTER — Telehealth: Payer: Self-pay

## 2022-07-07 ENCOUNTER — Other Ambulatory Visit (HOSPITAL_BASED_OUTPATIENT_CLINIC_OR_DEPARTMENT_OTHER): Payer: Self-pay

## 2022-07-07 MED ORDER — INSULIN GLARGINE-YFGN 100 UNIT/ML ~~LOC~~ SOPN
20.0000 [IU] | PEN_INJECTOR | Freq: Every day | SUBCUTANEOUS | 11 refills | Status: DC
  Filled 2022-07-07: qty 15, 20d supply, fill #0
  Filled 2022-09-07: qty 15, 30d supply, fill #0

## 2022-07-07 NOTE — Telephone Encounter (Signed)
Pharmacy is requesting a new rx for Semglee. In order to bill the insurance properly, the pharmacy needs to know what the maximum unit per day will be? Thanks in advance.

## 2022-07-08 ENCOUNTER — Other Ambulatory Visit (HOSPITAL_BASED_OUTPATIENT_CLINIC_OR_DEPARTMENT_OTHER): Payer: Self-pay

## 2022-07-18 ENCOUNTER — Inpatient Hospital Stay: Payer: 59 | Attending: Hematology & Oncology

## 2022-07-22 ENCOUNTER — Ambulatory Visit: Payer: 59 | Admitting: Sports Medicine

## 2022-07-22 DIAGNOSIS — M7989 Other specified soft tissue disorders: Secondary | ICD-10-CM

## 2022-07-22 NOTE — Assessment & Plan Note (Signed)
Jessica [name] returns, she is a very pleasant 67 year old female, she did some housecleaning maybe a month and a half or 2 months ago, she did a lot of wringing out of wet clothes by hand, she then developed severe pain and swelling of the left third volar hand proximal from the MCP to the PIP. She did some oral steroids, she did not have any improvement, at the last visit we did a third flexor tendon sheath injection with ultrasound guidance, I did notice dramatic synovitis, she returns today completely pain-free, happy with results, if this does recur we would likely need to test her for rheumatoid arthritis.

## 2022-07-22 NOTE — Progress Notes (Signed)
    Procedures performed today:    None.  Independent interpretation of notes and tests performed by another provider:   None.  Brief History, Exam, Impression, and Recommendations:    Swelling of left middle finger Jessica [name] returns, she is a very pleasant 67 year old female, she did some housecleaning maybe a month and a half or 2 months ago, she did a lot of wringing out of wet clothes by hand, she then developed severe pain and swelling of the left third volar hand proximal from the MCP to the PIP. She did some oral steroids, she did not have any improvement, at the last visit we did a third flexor tendon sheath injection with ultrasound guidance, I did notice dramatic synovitis, she returns today completely pain-free, happy with results, if this does recur we would likely need to test her for rheumatoid arthritis.    ____________________________________________ Ihor Austin. Benjamin Stain, M.D., ABFM., CAQSM., AME. Primary Care and Sports Medicine [facility]  Adjunct Professor of Family Medicine  [employer] [facility] [employer]  Restaurant manager, fast food

## 2022-08-12 ENCOUNTER — Other Ambulatory Visit (HOSPITAL_BASED_OUTPATIENT_CLINIC_OR_DEPARTMENT_OTHER): Payer: Self-pay

## 2022-08-31 ENCOUNTER — Other Ambulatory Visit: Payer: Self-pay | Admitting: Medical-Surgical

## 2022-08-31 DIAGNOSIS — I152 Hypertension secondary to endocrine disorders: Secondary | ICD-10-CM

## 2022-09-01 ENCOUNTER — Other Ambulatory Visit (HOSPITAL_BASED_OUTPATIENT_CLINIC_OR_DEPARTMENT_OTHER): Payer: Self-pay

## 2022-09-01 MED ORDER — AMLODIPINE BESYLATE 5 MG PO TABS
5.0000 mg | ORAL_TABLET | Freq: Every day | ORAL | 0 refills | Status: DC
  Filled 2022-09-01: qty 30, 30d supply, fill #0

## 2022-09-02 ENCOUNTER — Encounter (INDEPENDENT_AMBULATORY_CARE_PROVIDER_SITE_OTHER): Payer: 59 | Admitting: Sports Medicine

## 2022-09-02 ENCOUNTER — Telehealth: Payer: Self-pay | Admitting: *Deleted

## 2022-09-02 DIAGNOSIS — Z0271 Encounter for disability determination: Secondary | ICD-10-CM

## 2022-09-02 NOTE — Telephone Encounter (Signed)
Called patient and lvm of new rescheduled appointment - requested call back to confirm.

## 2022-09-05 NOTE — Telephone Encounter (Signed)
I spent 5 total minutes of online digital evaluation and management services in this patient-initiated request for online care. 

## 2022-09-06 NOTE — Telephone Encounter (Signed)
Called patient LVM to inform that form is ready for pick up, thanks.

## 2022-09-07 ENCOUNTER — Encounter: Payer: Self-pay | Admitting: Family

## 2022-09-07 ENCOUNTER — Other Ambulatory Visit (HOSPITAL_BASED_OUTPATIENT_CLINIC_OR_DEPARTMENT_OTHER): Payer: Self-pay

## 2022-09-21 ENCOUNTER — Other Ambulatory Visit: Payer: Self-pay

## 2022-09-21 ENCOUNTER — Other Ambulatory Visit: Payer: Self-pay | Admitting: Medical-Surgical

## 2022-09-21 ENCOUNTER — Other Ambulatory Visit (HOSPITAL_BASED_OUTPATIENT_CLINIC_OR_DEPARTMENT_OTHER): Payer: Self-pay

## 2022-09-21 DIAGNOSIS — Z794 Long term (current) use of insulin: Secondary | ICD-10-CM

## 2022-09-21 MED ORDER — METFORMIN HCL 1000 MG PO TABS
1000.0000 mg | ORAL_TABLET | Freq: Two times a day (BID) | ORAL | 0 refills | Status: DC
  Filled 2022-09-21: qty 180, 90d supply, fill #0

## 2022-10-11 ENCOUNTER — Other Ambulatory Visit: Payer: Self-pay | Admitting: Medical-Surgical

## 2022-10-11 ENCOUNTER — Other Ambulatory Visit (HOSPITAL_BASED_OUTPATIENT_CLINIC_OR_DEPARTMENT_OTHER): Payer: Self-pay

## 2022-10-11 MED ORDER — OZEMPIC (1 MG/DOSE) 4 MG/3ML ~~LOC~~ SOPN
1.0000 mg | PEN_INJECTOR | SUBCUTANEOUS | 1 refills | Status: DC
  Filled 2022-10-11: qty 9, 84d supply, fill #0

## 2022-10-13 ENCOUNTER — Other Ambulatory Visit (HOSPITAL_BASED_OUTPATIENT_CLINIC_OR_DEPARTMENT_OTHER): Payer: Self-pay

## 2022-10-17 ENCOUNTER — Inpatient Hospital Stay: Payer: 59

## 2022-10-17 ENCOUNTER — Inpatient Hospital Stay: Payer: 59 | Admitting: Family

## 2022-10-24 ENCOUNTER — Inpatient Hospital Stay: Payer: 59 | Admitting: Family

## 2022-10-24 ENCOUNTER — Inpatient Hospital Stay: Payer: 59

## 2022-11-24 ENCOUNTER — Ambulatory Visit (INDEPENDENT_AMBULATORY_CARE_PROVIDER_SITE_OTHER): Payer: 59 | Admitting: Medical-Surgical

## 2022-11-24 ENCOUNTER — Encounter: Payer: Self-pay | Admitting: Medical-Surgical

## 2022-11-24 ENCOUNTER — Other Ambulatory Visit (HOSPITAL_BASED_OUTPATIENT_CLINIC_OR_DEPARTMENT_OTHER): Payer: Self-pay

## 2022-11-24 VITALS — BP 117/70 | HR 67 | Ht 59.0 in | Wt 180.1 lb

## 2022-11-24 DIAGNOSIS — Z780 Asymptomatic menopausal state: Secondary | ICD-10-CM | POA: Diagnosis not present

## 2022-11-24 DIAGNOSIS — Z8639 Personal history of other endocrine, nutritional and metabolic disease: Secondary | ICD-10-CM | POA: Diagnosis not present

## 2022-11-24 DIAGNOSIS — Z6841 Body Mass Index (BMI) 40.0 and over, adult: Secondary | ICD-10-CM | POA: Diagnosis not present

## 2022-11-24 DIAGNOSIS — Z794 Long term (current) use of insulin: Secondary | ICD-10-CM

## 2022-11-24 DIAGNOSIS — Z23 Encounter for immunization: Secondary | ICD-10-CM | POA: Diagnosis not present

## 2022-11-24 DIAGNOSIS — R809 Proteinuria, unspecified: Secondary | ICD-10-CM | POA: Diagnosis not present

## 2022-11-24 DIAGNOSIS — Z0000 Encounter for general adult medical examination without abnormal findings: Secondary | ICD-10-CM | POA: Diagnosis not present

## 2022-11-24 DIAGNOSIS — E669 Obesity, unspecified: Secondary | ICD-10-CM

## 2022-11-24 DIAGNOSIS — E1129 Type 2 diabetes mellitus with other diabetic kidney complication: Secondary | ICD-10-CM

## 2022-11-24 DIAGNOSIS — D509 Iron deficiency anemia, unspecified: Secondary | ICD-10-CM | POA: Diagnosis not present

## 2022-11-24 DIAGNOSIS — F330 Major depressive disorder, recurrent, mild: Secondary | ICD-10-CM | POA: Diagnosis not present

## 2022-11-24 LAB — POCT GLYCOSYLATED HEMOGLOBIN (HGB A1C): Hemoglobin A1C: 6.5 % — AB (ref 4.0–5.6)

## 2022-11-24 MED ORDER — OZEMPIC (2 MG/DOSE) 8 MG/3ML ~~LOC~~ SOPN
2.0000 mg | PEN_INJECTOR | SUBCUTANEOUS | 3 refills | Status: DC
  Filled 2022-11-24 – 2022-11-25 (×2): qty 9, 84d supply, fill #0
  Filled 2022-12-20: qty 3, 28d supply, fill #0
  Filled 2023-01-23: qty 3, 28d supply, fill #1
  Filled 2023-02-22: qty 3, 28d supply, fill #2
  Filled 2023-03-19: qty 3, 28d supply, fill #3
  Filled 2023-04-30: qty 3, 28d supply, fill #4
  Filled 2023-05-29: qty 3, 28d supply, fill #5
  Filled 2023-06-24: qty 3, 28d supply, fill #6
  Filled 2023-07-18: qty 3, 28d supply, fill #7
  Filled 2023-08-23: qty 3, 28d supply, fill #8

## 2022-11-24 NOTE — Progress Notes (Signed)
Complete physical exam  Patient: Jessica [name]   DOB: July 06, 1955   67 y.o. Female  MRN: [id number]  Subjective:    Chief Complaint  Patient presents with   Annual Exam    Jessica [name] is a 67 y.o. female who presents today for a complete physical exam. She reports consuming a general diet. The patient does not participate in regular exercise at present. She generally feels poorly. She reports sleeping poorly. She does not have additional problems to discuss today.    Most recent fall risk assessment:    11/24/2022    9:31 AM  Fall Risk   Falls in the past year? 1  Number falls in past yr: 0  Injury with Fall? 1  Follow up Falls evaluation completed     Most recent depression screenings:    11/24/2022   11:42 AM 11/24/2022    9:31 AM  PHQ 2/9 Scores  PHQ - 2 Score 2 2  PHQ- 9 Score 9     Vision:Not within last year  and Dental: No current dental problems and No regular dental care     Patient Care Team: Christen Butter, NP as PCP - General (Nurse Practitioner)   Outpatient Medications Prior to Visit  Medication Sig   amLODipine (NORVASC) 5 MG tablet Take 1 tablet (5 mg total) by mouth daily. Needs appointment.   insulin glargine-yfgn (SEMGLEE) 100 UNIT/ML Pen Inject 20-30 Units into the skin at bedtime. INCREASE BY 2 UNITS EVERY 3 DAYS UNTIL FBS CONSISTENTLY 90-130 THEN STAY AT THAT DOSE. Max daily dose: 50 units.   lisinopril-hydrochlorothiazide (ZESTORETIC) 10-12.5 MG tablet Take 1 tablet by mouth daily.   Melatonin 5 MG CHEW Chew 5 mg by mouth at bedtime as needed (for sleep).   meloxicam (MOBIC) 15 MG tablet One tablet by mouth every 24 hours with a meal for 2 weeks, then once every 24 hours as needed for pain.   metFORMIN (GLUCOPHAGE) 1000 MG tablet Take 1 tablet (1,000 mg total) by mouth 2 (two) times daily with a meal.   Multiple Vitamins-Minerals (EYE VITAMINS PO) Take 1 tablet by mouth daily.   rosuvastatin (CRESTOR) 40 MG tablet TAKE 1 TABLET (40  MG TOTAL) BY MOUTH AT BEDTIME.   sertraline (ZOLOFT) 100 MG tablet Take 1 tablet (100 mg total) by mouth daily.   Specialty Vitamins Products (MAGNESIUM, AMINO ACID CHELATE,) 133 MG tablet Take 1 tablet by mouth daily.   [DISCONTINUED] Semaglutide, 1 MG/DOSE, (OZEMPIC, 1 MG/DOSE,) 4 MG/3ML SOPN Inject 1 mg into the skin once a week.   No facility-administered medications prior to visit.    Review of Systems  Constitutional:  Positive for malaise/fatigue. Negative for chills, fever and weight loss.  HENT:  Negative for congestion, ear pain, hearing loss, sinus pain and sore throat.   Eyes:  Negative for blurred vision, photophobia and pain.  Respiratory:  Negative for cough, shortness of breath and wheezing.   Cardiovascular:  Negative for chest pain, palpitations and leg swelling.  Gastrointestinal:  Negative for abdominal pain, constipation, diarrhea, heartburn, nausea and vomiting.  Genitourinary:  Negative for dysuria, frequency and urgency.  Musculoskeletal:  Negative for falls and neck pain.  Skin:  Negative for itching and rash.  Neurological:  Negative for dizziness, weakness and headaches.  Endo/Heme/Allergies:  Negative for polydipsia. Does not bruise/bleed easily.  Psychiatric/Behavioral:  Positive for depression. Negative for substance abuse and suicidal ideas. The patient is nervous/anxious and has insomnia.      Objective:  BP 117/70   Pulse 67   Ht 4\' 11"  (1.499 m)   Wt 180 lb 1.3 oz (81.7 kg)   SpO2 100%   BMI 36.37 kg/m    Physical Exam Constitutional:      General: She is not in acute distress.    Appearance: Normal appearance. She is not ill-appearing.  HENT:     Head: Normocephalic and atraumatic.     Right Ear: Tympanic membrane, ear canal and external ear normal. There is no impacted cerumen.     Left Ear: Tympanic membrane, ear canal and external ear normal. There is no impacted cerumen.     Nose: Nose normal. No congestion or rhinorrhea.      Mouth/Throat:     Mouth: Mucous membranes are moist.     Pharynx: No oropharyngeal exudate or posterior oropharyngeal erythema.  Eyes:     General: No scleral icterus.       Right eye: No discharge.        Left eye: No discharge.     Extraocular Movements: Extraocular movements intact.     Conjunctiva/sclera: Conjunctivae normal.     Pupils: Pupils are equal, round, and reactive to light.  Neck:     Thyroid: No thyromegaly.     Vascular: No carotid bruit or JVD.     Trachea: Trachea normal.  Cardiovascular:     Rate and Rhythm: Normal rate and regular rhythm.     Pulses: Normal pulses.     Heart sounds: Normal heart sounds. No murmur heard.    No friction rub. No gallop.  Pulmonary:     Effort: Pulmonary effort is normal. No respiratory distress.     Breath sounds: Normal breath sounds. No wheezing.  Abdominal:     General: Bowel sounds are normal. There is no distension.     Palpations: Abdomen is soft.     Tenderness: There is no abdominal tenderness. There is no guarding.  Musculoskeletal:        General: Normal range of motion.     Cervical back: Normal range of motion and neck supple.  Lymphadenopathy:     Cervical: No cervical adenopathy.  Skin:    General: Skin is warm and dry.  Neurological:     Mental Status: She is alert and oriented to person, place, and time.     Cranial Nerves: No cranial nerve deficit.  Psychiatric:        Mood and Affect: Mood normal.        Behavior: Behavior normal.        Thought Content: Thought content normal.        Judgment: Judgment normal.      Results for orders placed or performed in visit on 11/24/22  POCT HgB A1C  Result Value Ref Range   Hemoglobin A1C 6.5 (A) 4.0 - 5.6 %   HbA1c POC (<> result, manual entry)     HbA1c, POC (prediabetic range)     HbA1c, POC (controlled diabetic range)         Assessment & Plan:    Routine Health Maintenance and Physical Exam  Immunization History  Administered Date(s)  Administered   Fluad Trivalent(High Dose 65+) 11/24/2022   Hepatitis B 11/27/2015, 04/07/2017, 11/06/2017   Hepatitis B, PED/ADOLESCENT 11/27/2015, 04/07/2017, 11/06/2017   Influenza Whole 03/21/2005   Influenza,inj,Quad PF,6+ Mos 12/18/2019, 11/19/2021   Influenza-Unspecified 01/19/2019, 12/19/2020   PFIZER(Purple Top)SARS-COV-2 Vaccination 05/31/2019, 06/21/2019, 02/27/2020   Pneumococcal Polysaccharide-23 06/21/2007, 09/11/2012   Td 03/21/2005  Tdap 01/12/2010, 04/05/2021   Zoster Recombinant(Shingrix) 09/14/2020, 04/05/2021    Health Maintenance  Topic Date Due   OPHTHALMOLOGY EXAM  09/03/2021   FOOT EXAM  04/05/2022   Diabetic kidney evaluation - Urine ACR  10/05/2022   DEXA SCAN  Never done   COVID-19 Vaccine (4 - 2023-24 season) 11/20/2022   Pneumonia Vaccine 53+ Years old (2 of 2 - PCV) 10/29/2022   PAP SMEAR-Modifier  05/24/2023 (Originally 12/12/2009)   HEMOGLOBIN A1C  05/24/2023   Diabetic kidney evaluation - eGFR measurement  05/31/2023   MAMMOGRAM  01/13/2024   Fecal DNA (Cologuard)  12/23/2024   DTaP/Tdap/Td (4 - Td or Tdap) 04/06/2031   INFLUENZA VACCINE  Completed   Hepatitis C Screening  Completed   HIV Screening  Completed   Zoster Vaccines- Shingrix  Completed   HPV VACCINES  Aged Out    Discussed health benefits of physical activity, and encouraged her to engage in regular exercise appropriate for her age and condition.  1. Annual physical exam Checking labs as below.  Overdue for diabetic eye exam and plans to schedule.  Wellness information provided with AVS. - Lipid panel - CBC with Differential/Platelet - CMP14+EGFR  2. Type 2 diabetes mellitus with microalbuminuria, with long-term current use of insulin (HCC) POCT hemoglobin A1c at 6.5% showing excellent control.  Continue metformin 1 g twice daily, Semglee 20-30 units at bedtime.  Per patient request, increase Ozempic to 2 mg weekly to aid with better glucose control and possible weight loss. -  CMP14+EGFR - POCT UA - Microalbumin - POCT HgB A1C - Semaglutide, 2 MG/DOSE, (OZEMPIC, 2 MG/DOSE,) 8 MG/3ML SOPN; Inject 2 mg into the skin once a week.  Dispense: 9 mL; Refill: 3  3. Mild episode of recurrent major depressive disorder (HCC) Taking Zoloft 100 mg daily as prescribed, tolerating well without side effects.  Feels the medication is working well despite additional stressors.  Denies SI/HI.  Continue Zoloft as prescribed.  4.  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  Increasing Ozempic as noted above.  Checking TSH today.   - TSH  5. Iron deficiency anemia, unspecified iron deficiency anemia type Mr. Appointment with hematology.  Rechecking iron today. - Iron, TIBC and Ferritin Panel  6. Postmenopausal DEXA scan ordered. - DG Bone Density; Future  7. History of vitamin D deficiency Checking vitamin D. - VITAMIN D 25 Hydroxy (Vit-D Deficiency, Fractures)  8. Encounter for immunization Flu vaccine given in office today. - Flu Vaccine Trivalent High Dose (Fluad)   Return in about 6 months (around 05/24/2023) for chronic disease follow up.   Christen Butter, NP

## 2022-11-25 ENCOUNTER — Other Ambulatory Visit (HOSPITAL_BASED_OUTPATIENT_CLINIC_OR_DEPARTMENT_OTHER): Payer: Self-pay

## 2022-11-25 ENCOUNTER — Encounter: Payer: Self-pay | Admitting: Family

## 2022-11-25 LAB — CBC WITH DIFFERENTIAL/PLATELET
Basophils Absolute: 0 10*3/uL (ref 0.0–0.2)
Basos: 1 %
EOS (ABSOLUTE): 0.5 10*3/uL — ABNORMAL HIGH (ref 0.0–0.4)
Eos: 6 %
Hematocrit: 39.5 % (ref 34.0–46.6)
Hemoglobin: 13.3 g/dL (ref 11.1–15.9)
Immature Grans (Abs): 0 10*3/uL (ref 0.0–0.1)
Immature Granulocytes: 0 %
Lymphocytes Absolute: 1.1 10*3/uL (ref 0.7–3.1)
Lymphs: 13 %
MCH: 30.3 pg (ref 26.6–33.0)
MCHC: 33.7 g/dL (ref 31.5–35.7)
MCV: 90 fL (ref 79–97)
Monocytes Absolute: 0.4 10*3/uL (ref 0.1–0.9)
Monocytes: 5 %
Neutrophils Absolute: 6.1 10*3/uL (ref 1.4–7.0)
Neutrophils: 75 %
Platelets: 243 10*3/uL (ref 150–450)
RBC: 4.39 x10E6/uL (ref 3.77–5.28)
RDW: 12.1 % (ref 11.7–15.4)
WBC: 8.2 10*3/uL (ref 3.4–10.8)

## 2022-11-25 LAB — LIPID PANEL
Chol/HDL Ratio: 2.4 ratio (ref 0.0–4.4)
Cholesterol, Total: 115 mg/dL (ref 100–199)
HDL: 48 mg/dL (ref >39)
LDL Chol Calc (NIH): 47 mg/dL (ref 0–99)
Triglycerides: 109 mg/dL (ref 0–149)
VLDL Cholesterol Cal: 20 mg/dL (ref 5–40)

## 2022-11-25 LAB — IRON,TIBC AND FERRITIN PANEL
Ferritin: 672 ng/mL — ABNORMAL HIGH (ref 15–150)
Iron Saturation: 17 % (ref 15–55)
Iron: 57 ug/dL (ref 27–139)
Total Iron Binding Capacity: 335 ug/dL (ref 250–450)
UIBC: 278 ug/dL (ref 118–369)

## 2022-11-25 LAB — CMP14+EGFR
ALT: 28 IU/L (ref 0–32)
AST: 31 IU/L (ref 0–40)
Albumin: 4.2 g/dL (ref 3.9–4.9)
Alkaline Phosphatase: 62 IU/L (ref 44–121)
BUN/Creatinine Ratio: 20 (ref 12–28)
BUN: 15 mg/dL (ref 8–27)
Bilirubin Total: 0.8 mg/dL (ref 0.0–1.2)
CO2: 25 mmol/L (ref 20–29)
Calcium: 9.4 mg/dL (ref 8.7–10.3)
Chloride: 90 mmol/L — ABNORMAL LOW (ref 96–106)
Creatinine, Ser: 0.74 mg/dL (ref 0.57–1.00)
Globulin, Total: 2.4 g/dL (ref 1.5–4.5)
Glucose: 111 mg/dL — ABNORMAL HIGH (ref 70–99)
Potassium: 3.8 mmol/L (ref 3.5–5.2)
Sodium: 133 mmol/L — ABNORMAL LOW (ref 134–144)
Total Protein: 6.6 g/dL (ref 6.0–8.5)
eGFR: 90 mL/min/{1.73_m2} (ref >59)

## 2022-11-25 LAB — TSH: TSH: 3.53 u[IU]/mL (ref 0.450–4.500)

## 2022-11-25 LAB — VITAMIN D 25 HYDROXY (VIT D DEFICIENCY, FRACTURES): Vit D, 25-Hydroxy: 50.3 ng/mL (ref 30.0–100.0)

## 2022-11-25 NOTE — Addendum Note (Signed)
Addended byChristen Butter on: 11/25/2022 06:00 PM   Modules accepted: Orders

## 2022-11-28 ENCOUNTER — Other Ambulatory Visit (HOSPITAL_BASED_OUTPATIENT_CLINIC_OR_DEPARTMENT_OTHER): Payer: Self-pay

## 2022-11-29 ENCOUNTER — Other Ambulatory Visit (HOSPITAL_BASED_OUTPATIENT_CLINIC_OR_DEPARTMENT_OTHER): Payer: Self-pay

## 2022-11-30 ENCOUNTER — Other Ambulatory Visit (HOSPITAL_BASED_OUTPATIENT_CLINIC_OR_DEPARTMENT_OTHER): Payer: Self-pay

## 2022-12-01 ENCOUNTER — Other Ambulatory Visit (HOSPITAL_BASED_OUTPATIENT_CLINIC_OR_DEPARTMENT_OTHER): Payer: Self-pay

## 2022-12-05 ENCOUNTER — Other Ambulatory Visit (HOSPITAL_BASED_OUTPATIENT_CLINIC_OR_DEPARTMENT_OTHER): Payer: Self-pay

## 2022-12-06 ENCOUNTER — Other Ambulatory Visit (HOSPITAL_BASED_OUTPATIENT_CLINIC_OR_DEPARTMENT_OTHER): Payer: Self-pay

## 2022-12-09 ENCOUNTER — Other Ambulatory Visit (HOSPITAL_BASED_OUTPATIENT_CLINIC_OR_DEPARTMENT_OTHER): Payer: Self-pay

## 2022-12-12 ENCOUNTER — Other Ambulatory Visit: Payer: Self-pay

## 2022-12-12 ENCOUNTER — Other Ambulatory Visit (HOSPITAL_BASED_OUTPATIENT_CLINIC_OR_DEPARTMENT_OTHER): Payer: Self-pay

## 2022-12-12 ENCOUNTER — Other Ambulatory Visit: Payer: Self-pay | Admitting: Family Medicine

## 2022-12-12 ENCOUNTER — Other Ambulatory Visit: Payer: Self-pay | Admitting: Sports Medicine

## 2022-12-12 DIAGNOSIS — M79645 Pain in left finger(s): Secondary | ICD-10-CM

## 2022-12-12 DIAGNOSIS — E785 Hyperlipidemia, unspecified: Secondary | ICD-10-CM

## 2022-12-12 MED ORDER — MELOXICAM 15 MG PO TABS
ORAL_TABLET | ORAL | 3 refills | Status: AC
  Filled 2022-12-12: qty 30, 30d supply, fill #0
  Filled 2023-02-22: qty 30, 30d supply, fill #1
  Filled 2023-04-08: qty 30, 30d supply, fill #2
  Filled 2023-05-29: qty 30, 30d supply, fill #3

## 2022-12-12 MED ORDER — ROSUVASTATIN CALCIUM 40 MG PO TABS
40.0000 mg | ORAL_TABLET | Freq: Every day | ORAL | 3 refills | Status: DC
  Filled 2022-12-12: qty 90, 90d supply, fill #0
  Filled 2023-03-11: qty 90, 90d supply, fill #1
  Filled 2023-06-14: qty 90, 90d supply, fill #2
  Filled 2023-09-14: qty 90, 90d supply, fill #3

## 2022-12-19 ENCOUNTER — Other Ambulatory Visit (HOSPITAL_BASED_OUTPATIENT_CLINIC_OR_DEPARTMENT_OTHER): Payer: Self-pay

## 2022-12-20 ENCOUNTER — Other Ambulatory Visit (HOSPITAL_BASED_OUTPATIENT_CLINIC_OR_DEPARTMENT_OTHER): Payer: Self-pay

## 2022-12-27 ENCOUNTER — Other Ambulatory Visit (HOSPITAL_BASED_OUTPATIENT_CLINIC_OR_DEPARTMENT_OTHER): Payer: Self-pay

## 2022-12-28 ENCOUNTER — Other Ambulatory Visit (HOSPITAL_BASED_OUTPATIENT_CLINIC_OR_DEPARTMENT_OTHER): Payer: Self-pay

## 2023-01-02 ENCOUNTER — Ambulatory Visit: Payer: 59 | Admitting: Medical-Surgical

## 2023-01-10 ENCOUNTER — Other Ambulatory Visit: Payer: Self-pay | Admitting: Medical-Surgical

## 2023-01-10 ENCOUNTER — Other Ambulatory Visit: Payer: Self-pay | Admitting: Family Medicine

## 2023-01-10 DIAGNOSIS — F330 Major depressive disorder, recurrent, mild: Secondary | ICD-10-CM

## 2023-01-10 DIAGNOSIS — E1129 Type 2 diabetes mellitus with other diabetic kidney complication: Secondary | ICD-10-CM

## 2023-01-11 ENCOUNTER — Other Ambulatory Visit: Payer: Self-pay

## 2023-01-11 ENCOUNTER — Other Ambulatory Visit (HOSPITAL_BASED_OUTPATIENT_CLINIC_OR_DEPARTMENT_OTHER): Payer: Self-pay

## 2023-01-11 MED ORDER — SERTRALINE HCL 100 MG PO TABS
100.0000 mg | ORAL_TABLET | Freq: Every day | ORAL | 3 refills | Status: DC
  Filled 2023-01-11: qty 90, 90d supply, fill #0
  Filled 2023-04-08: qty 90, 90d supply, fill #1
  Filled 2023-07-04: qty 90, 90d supply, fill #2
  Filled 2023-09-14: qty 90, 90d supply, fill #3

## 2023-01-11 MED ORDER — METFORMIN HCL 1000 MG PO TABS
1000.0000 mg | ORAL_TABLET | Freq: Two times a day (BID) | ORAL | 0 refills | Status: DC
  Filled 2023-01-11: qty 180, 90d supply, fill #0

## 2023-01-24 ENCOUNTER — Encounter: Payer: Self-pay | Admitting: Medical-Surgical

## 2023-01-24 ENCOUNTER — Ambulatory Visit (INDEPENDENT_AMBULATORY_CARE_PROVIDER_SITE_OTHER): Payer: 59 | Admitting: Medical-Surgical

## 2023-01-24 VITALS — BP 110/71 | HR 71 | Temp 98.7°F | Resp 20 | Ht 59.0 in | Wt 179.0 lb

## 2023-01-24 DIAGNOSIS — J060 Acute laryngopharyngitis: Secondary | ICD-10-CM

## 2023-01-24 DIAGNOSIS — G4483 Primary cough headache: Secondary | ICD-10-CM

## 2023-01-24 LAB — POCT INFLUENZA A/B
Influenza A, POC: NEGATIVE
Influenza B, POC: NEGATIVE

## 2023-01-24 LAB — POCT RAPID STREP A (OFFICE): Rapid Strep A Screen: NEGATIVE

## 2023-01-24 LAB — POC COVID19 BINAXNOW: SARS Coronavirus 2 Ag: NEGATIVE

## 2023-01-24 MED ORDER — METHYLPREDNISOLONE ACETATE 80 MG/ML IJ SUSP
80.0000 mg | Freq: Once | INTRAMUSCULAR | Status: AC
  Administered 2023-01-24: 80 mg via INTRAMUSCULAR

## 2023-01-24 NOTE — Progress Notes (Signed)
        Established patient visit  History, exam, impression, and plan:  1. Cough headache 2. Sore throat and laryngitis Very pleasant 67 year old female presenting today for evaluation of 4-5 days of upper respiratory symptoms including fever, chills, sore throat, nonproductive cough, headache, body aches, decreased appetite, mild shortness of breath, painful swallowing, and generalized malaise.  Overnight developed laryngitis.  Has been using over-the-counter cold and flu preparations, Chloraseptic, and sore throat lozenges with minimal to moderate temporary relief of symptoms.  POCT flu, strep, and COVID all negative today.  Likely viral URI.  Depo-Medrol 80 mg IM x 1 given in office today due to severity of sore throat and laryngitis.  Continue conservative measures and over-the-counter cold and flu preparations as needed.  Offered prescription cough medicine, patient declined today. - POCT Influenza A/B - POCT rapid strep A - POC COVID-19 - methylPREDNISolone acetate (DEPO-MEDROL) injection 80 mg  Physical Exam Vitals reviewed.  Constitutional:      General: She is not in acute distress.    Appearance: She is well-developed. She is obese. She is ill-appearing.  HENT:     Head: Normocephalic and atraumatic.     Right Ear: Tympanic membrane and ear canal normal. No middle ear effusion. Tympanic membrane is not erythematous.     Left Ear: Tympanic membrane and ear canal normal.  No middle ear effusion. Tympanic membrane is not erythematous.     Nose: No congestion or rhinorrhea.     Mouth/Throat:     Mouth: Mucous membranes are moist.     Pharynx: Uvula midline. Posterior oropharyngeal erythema present.     Tonsils: No tonsillar exudate or tonsillar abscesses.     Comments: Hoarse voice  Eyes:     Conjunctiva/sclera: Conjunctivae normal.     Pupils: Pupils are equal, round, and reactive to light.  Cardiovascular:     Rate and Rhythm: Normal rate and regular rhythm.     Heart  sounds: No murmur heard.    No friction rub. No gallop.  Pulmonary:     Effort: Pulmonary effort is normal. No respiratory distress.     Breath sounds: Normal breath sounds.  Skin:    General: Skin is warm and dry.  Neurological:     Mental Status: She is alert.  Psychiatric:        Mood and Affect: Mood normal.        Behavior: Behavior normal.    Procedures performed this visit: None.  Return if symptoms worsen or fail to improve.  __________________________________ Thayer Ohm, DNP, APRN, FNP-BC Primary Care and Sports Medicine [facility]

## 2023-01-27 ENCOUNTER — Encounter: Payer: Self-pay | Admitting: Medical-Surgical

## 2023-01-31 ENCOUNTER — Other Ambulatory Visit: Payer: Self-pay | Admitting: Medical-Surgical

## 2023-01-31 DIAGNOSIS — Z1231 Encounter for screening mammogram for malignant neoplasm of breast: Secondary | ICD-10-CM

## 2023-03-08 ENCOUNTER — Ambulatory Visit (INDEPENDENT_AMBULATORY_CARE_PROVIDER_SITE_OTHER): Payer: 59 | Admitting: Sports Medicine

## 2023-03-08 DIAGNOSIS — M7989 Other specified soft tissue disorders: Secondary | ICD-10-CM | POA: Diagnosis not present

## 2023-03-08 DIAGNOSIS — M2550 Pain in unspecified joint: Secondary | ICD-10-CM

## 2023-03-08 NOTE — Progress Notes (Signed)
    Procedures performed today:    None.  Independent interpretation of notes and tests performed by another provider:   None.  Brief History, Exam, Impression, and Recommendations:    Swelling of left middle finger Saleema returns, she is a very pleasant 67 year old female, we saw her about 7 or 8 months ago, we did a left third flexor sheath injection after failure of conservative treatment, we did see significant synovitis. She did not compressively well. Unfortunately having recurrence of triggering, mostly annoying but not painful. We will start conservatively again with home PT, but due to the significance of the synovitis at the last visit and the recurrence we will also test her for rheumatoid arthritis, she does tell me she has offspring with autoimmune disease. She will also restart her meloxicam for 2 weeks.    ____________________________________________ Ihor Austin. Benjamin Stain, M.D., ABFM., CAQSM., AME. Primary Care and Sports Medicine [facility]  Adjunct Professor of Family Medicine  [employer] [facility] [employer]  Restaurant manager, fast food

## 2023-03-08 NOTE — Assessment & Plan Note (Signed)
Jessica [name] returns, she is a very pleasant 67 year old female, we saw her about 7 or 8 months ago, we did a left third flexor sheath injection after failure of conservative treatment, we did see significant synovitis. She did not compressively well. Unfortunately having recurrence of triggering, mostly annoying but not painful. We will start conservatively again with home PT, but due to the significance of the synovitis at the last visit and the recurrence we will also test her for rheumatoid arthritis, she does tell me she has offspring with autoimmune disease. She will also restart her meloxicam for 2 weeks.

## 2023-03-10 ENCOUNTER — Encounter: Payer: Self-pay | Admitting: Sports Medicine

## 2023-03-10 LAB — COMPREHENSIVE METABOLIC PANEL
ALT: 48 [IU]/L — ABNORMAL HIGH (ref 0–32)
AST: 43 [IU]/L — ABNORMAL HIGH (ref 0–40)
Albumin: 4.4 g/dL (ref 3.9–4.9)
Alkaline Phosphatase: 73 [IU]/L (ref 44–121)
BUN/Creatinine Ratio: 22 (ref 12–28)
BUN: 14 mg/dL (ref 8–27)
Bilirubin Total: 0.5 mg/dL (ref 0.0–1.2)
CO2: 23 mmol/L (ref 20–29)
Calcium: 9.1 mg/dL (ref 8.7–10.3)
Chloride: 92 mmol/L — ABNORMAL LOW (ref 96–106)
Creatinine, Ser: 0.65 mg/dL (ref 0.57–1.00)
Globulin, Total: 2.4 g/dL (ref 1.5–4.5)
Glucose: 135 mg/dL — ABNORMAL HIGH (ref 70–99)
Potassium: 4.2 mmol/L (ref 3.5–5.2)
Sodium: 132 mmol/L — ABNORMAL LOW (ref 134–144)
Total Protein: 6.8 g/dL (ref 6.0–8.5)
eGFR: 98 mL/min/{1.73_m2} (ref >59)

## 2023-03-10 LAB — SYSTEMIC LUPUS PROFILE A
Chromatin Ab SerPl-aCnc: <0.2 AI (ref 0.0–0.9)
ENA RNP Ab: <0.2 AI (ref 0.0–0.9)
ENA SM Ab Ser-aCnc: <0.2 AI (ref 0.0–0.9)
ENA SSA (RO) Ab: <0.2 AI (ref 0.0–0.9)
ENA SSB (LA) Ab: <0.2 AI (ref 0.0–0.9)
Rheumatoid fact SerPl-aCnc: <10 [IU]/mL (ref <14.0)
dsDNA Ab: <1 [IU]/mL (ref 0–9)

## 2023-03-10 LAB — CBC WITH DIFFERENTIAL/PLATELET
Basophils Absolute: 0.1 10*3/uL (ref 0.0–0.2)
Basos: 1 %
EOS (ABSOLUTE): 0.7 10*3/uL — ABNORMAL HIGH (ref 0.0–0.4)
Eos: 7 %
Hematocrit: 40 % (ref 34.0–46.6)
Hemoglobin: 13.2 g/dL (ref 11.1–15.9)
Immature Grans (Abs): 0 10*3/uL (ref 0.0–0.1)
Immature Granulocytes: 0 %
Lymphocytes Absolute: 1.3 10*3/uL (ref 0.7–3.1)
Lymphs: 13 %
MCH: 29.8 pg (ref 26.6–33.0)
MCHC: 33 g/dL (ref 31.5–35.7)
MCV: 90 fL (ref 79–97)
Monocytes Absolute: 0.5 10*3/uL (ref 0.1–0.9)
Monocytes: 5 %
Neutrophils Absolute: 7.4 10*3/uL — ABNORMAL HIGH (ref 1.4–7.0)
Neutrophils: 74 %
Platelets: 291 10*3/uL (ref 150–450)
RBC: 4.43 x10E6/uL (ref 3.77–5.28)
RDW: 12.6 % (ref 11.7–15.4)
WBC: 9.9 10*3/uL (ref 3.4–10.8)

## 2023-03-10 LAB — RHEUMATOID ARTHRITIS PROFILE: Cyclic Citrullin Peptide Ab: 4 U (ref 0–19)

## 2023-03-10 LAB — SEDIMENTATION RATE: Sed Rate: 8 mm/h (ref 0–40)

## 2023-03-10 LAB — CK: Total CK: 125 U/L (ref 32–182)

## 2023-03-10 LAB — URIC ACID: Uric Acid: 3.5 mg/dL (ref 3.0–7.2)

## 2023-03-11 ENCOUNTER — Other Ambulatory Visit: Payer: Self-pay | Admitting: Medical-Surgical

## 2023-03-11 DIAGNOSIS — I152 Hypertension secondary to endocrine disorders: Secondary | ICD-10-CM

## 2023-03-13 ENCOUNTER — Other Ambulatory Visit: Payer: Self-pay

## 2023-03-13 ENCOUNTER — Other Ambulatory Visit (HOSPITAL_BASED_OUTPATIENT_CLINIC_OR_DEPARTMENT_OTHER): Payer: Self-pay

## 2023-03-13 MED ORDER — AMLODIPINE BESYLATE 5 MG PO TABS
5.0000 mg | ORAL_TABLET | Freq: Every day | ORAL | 0 refills | Status: DC
  Filled 2023-03-13: qty 90, 90d supply, fill #0

## 2023-03-20 ENCOUNTER — Other Ambulatory Visit (HOSPITAL_BASED_OUTPATIENT_CLINIC_OR_DEPARTMENT_OTHER): Payer: Self-pay

## 2023-03-29 ENCOUNTER — Encounter: Payer: Self-pay | Admitting: Family

## 2023-04-05 ENCOUNTER — Ambulatory Visit: Payer: 59

## 2023-04-05 ENCOUNTER — Ambulatory Visit (INDEPENDENT_AMBULATORY_CARE_PROVIDER_SITE_OTHER): Payer: 59

## 2023-04-05 DIAGNOSIS — Z780 Asymptomatic menopausal state: Secondary | ICD-10-CM | POA: Diagnosis not present

## 2023-04-05 DIAGNOSIS — Z1231 Encounter for screening mammogram for malignant neoplasm of breast: Secondary | ICD-10-CM

## 2023-04-08 ENCOUNTER — Other Ambulatory Visit: Payer: Self-pay | Admitting: Medical-Surgical

## 2023-04-08 DIAGNOSIS — E1129 Type 2 diabetes mellitus with other diabetic kidney complication: Secondary | ICD-10-CM

## 2023-04-10 ENCOUNTER — Other Ambulatory Visit (HOSPITAL_BASED_OUTPATIENT_CLINIC_OR_DEPARTMENT_OTHER): Payer: Self-pay

## 2023-04-10 ENCOUNTER — Encounter: Payer: Self-pay | Admitting: Family

## 2023-04-10 ENCOUNTER — Other Ambulatory Visit: Payer: Self-pay

## 2023-04-10 MED ORDER — METFORMIN HCL 1000 MG PO TABS
1000.0000 mg | ORAL_TABLET | Freq: Two times a day (BID) | ORAL | 0 refills | Status: DC
  Filled 2023-04-10: qty 180, 90d supply, fill #0

## 2023-04-19 ENCOUNTER — Ambulatory Visit: Payer: 59 | Admitting: Sports Medicine

## 2023-05-24 ENCOUNTER — Ambulatory Visit: Payer: 59 | Admitting: Medical-Surgical

## 2023-06-02 ENCOUNTER — Ambulatory Visit: Admitting: Medical-Surgical

## 2023-06-02 ENCOUNTER — Encounter: Payer: Self-pay | Admitting: Medical-Surgical

## 2023-06-02 ENCOUNTER — Encounter: Payer: Self-pay | Admitting: Family

## 2023-06-02 ENCOUNTER — Other Ambulatory Visit (HOSPITAL_BASED_OUTPATIENT_CLINIC_OR_DEPARTMENT_OTHER): Payer: Self-pay

## 2023-06-02 ENCOUNTER — Other Ambulatory Visit: Payer: Self-pay

## 2023-06-02 VITALS — BP 121/71 | HR 67 | Resp 20 | Ht 59.0 in | Wt 178.1 lb

## 2023-06-02 DIAGNOSIS — D509 Iron deficiency anemia, unspecified: Secondary | ICD-10-CM

## 2023-06-02 DIAGNOSIS — E1159 Type 2 diabetes mellitus with other circulatory complications: Secondary | ICD-10-CM | POA: Diagnosis not present

## 2023-06-02 DIAGNOSIS — Z794 Long term (current) use of insulin: Secondary | ICD-10-CM

## 2023-06-02 DIAGNOSIS — E1129 Type 2 diabetes mellitus with other diabetic kidney complication: Secondary | ICD-10-CM | POA: Diagnosis not present

## 2023-06-02 DIAGNOSIS — R809 Proteinuria, unspecified: Secondary | ICD-10-CM | POA: Diagnosis not present

## 2023-06-02 DIAGNOSIS — Z8639 Personal history of other endocrine, nutritional and metabolic disease: Secondary | ICD-10-CM

## 2023-06-02 DIAGNOSIS — F419 Anxiety disorder, unspecified: Secondary | ICD-10-CM | POA: Diagnosis not present

## 2023-06-02 DIAGNOSIS — F32A Depression, unspecified: Secondary | ICD-10-CM | POA: Diagnosis not present

## 2023-06-02 DIAGNOSIS — I152 Hypertension secondary to endocrine disorders: Secondary | ICD-10-CM

## 2023-06-02 LAB — POCT GLYCOSYLATED HEMOGLOBIN (HGB A1C)
HbA1c, POC (controlled diabetic range): 7.1 % — AB (ref 0.0–7.0)
Hemoglobin A1C: 7.1 % — AB (ref 4.0–5.6)

## 2023-06-02 LAB — POCT UA - MICROALBUMIN
Creatinine, POC: 50 mg/dL
Microalbumin Ur, POC: 10 mg/L

## 2023-06-02 MED ORDER — BLOOD GLUCOSE MONITOR SYSTEM W/DEVICE KIT
1.0000 | PACK | Freq: Once | 0 refills | Status: AC
Start: 2023-06-02 — End: 2023-06-13
  Filled 2023-06-02: qty 1, 90d supply, fill #0

## 2023-06-02 MED ORDER — ONETOUCH DELICA PLUS LANCET33G MISC
1.0000 | Freq: Three times a day (TID) | 11 refills | Status: DC
  Filled 2023-06-02: qty 100, 34d supply, fill #0
  Filled 2023-07-12: qty 100, 34d supply, fill #1

## 2023-06-02 MED ORDER — BLOOD GLUCOSE TEST VI STRP
1.0000 | ORAL_STRIP | Freq: Three times a day (TID) | 11 refills | Status: DC
  Filled 2023-06-02: qty 100, 34d supply, fill #0
  Filled 2023-07-12: qty 100, 34d supply, fill #1

## 2023-06-02 MED ORDER — LANCET DEVICE MISC
1.0000 | Freq: Three times a day (TID) | 0 refills | Status: AC
  Filled 2023-06-02 – 2023-06-24 (×2): qty 1, 30d supply, fill #0

## 2023-06-02 NOTE — Progress Notes (Signed)
        Established patient visit  History, exam, impression, and plan:  1. Type 2 diabetes mellitus with microalbuminuria, with long-term current use of insulin (HCC) (Primary) Very pleasant 68 year old female presenting today for follow-up on type 2 diabetes.  Her last hemoglobin A1c was 6.5% approximately 6 months ago.  She has been taking semaglutide 2 mg weekly, metformin 1000 mg twice daily, and Semglee.  Notes that she does not use Semglee every day.  If she eats a large meal before going to bed, she will inject 10 units.  If not, she sometimes skips the medication altogether.  Checks her sugar twice daily and has seen fasting readings around 126 when she wakes up in the evening and readings around 156 when she checks it later.  Requesting a new glucometer with associated supplies as hers is extremely old.  Prescription sent.  Recheck of POCT hemoglobin A1c today at 7.1% indicating that she is not well-controlled.  Microalbumin is abnormal however this is consistent with her history.  Continue semaglutide and metformin as prescribed.  Recommend using Semglee daily.  If she has a large meal before she goes to sleep, okay to use 10 units however if she does not, recommend using 5 units.  Titrate Semglee up by 2 units every 3 days until a fasting goal of 90-120. - POCT HgB A1C - POCT UA - Microalbumin - Blood Glucose Monitoring Suppl (BLOOD GLUCOSE MONITOR SYSTEM) w/Device KIT; Use as directed to monitor blood glucose levels.  Dispense: 1 kit; Refill: 0 - Glucose Blood (BLOOD GLUCOSE TEST STRIPS) STRP; Use 1 each in the morning, at noon, and at bedtime.  Dispense: 100 strip; Refill: 11 - Lancet Device MISC; Use 1 each in the morning, at noon, and at bedtime.  Dispense: 1 each; Refill: 0 - Lancets (ONETOUCH DELICA PLUS LANCET33G) MISC; Use 1 each in the morning, at noon, and at bedtime.  Dispense: 100 each; Refill: 11  2. Hypertension associated with diabetes (HCC) History of hypertension currently  managed with amlodipine 5 mg daily and lisinopril-hydrochlorothiazide 10-12.5 mg daily.  Occasionally checking blood pressures with no concerns for elevated readings.  Following a low-sodium diet.  Exercise is limited due to work schedule and physical debility.  Denies any concerning symptoms today.  Cardiopulmonary exam is normal.  Blood pressure at goal.  Continue amlodipine and lisinopril with hydrochlorothiazide as prescribed.  3. Anxiety and depression Currently taking Zoloft 100 mg daily, tolerating well without side effects.  Feels the medication is working well and keeping her mood stable.  Denies SI/HI.  Continue Zoloft as prescribed.  4. Iron deficiency anemia, unspecified iron deficiency anemia type History of iron deficiency that previously required oral supplementation followed by IV infusions due to poor response.  She has not been on oral iron replacement and it has been a while since she had her last infusion.  She experiences a lot of fatigue which could be related to her chronic diseases along with her work schedule and difficulty sleeping during the day.  Rechecking iron. - Iron, TIBC and Ferritin Panel  5. History of vitamin D deficiency Checking vitamin D.  No longer on oral supplementation. - VITAMIN D 25 Hydroxy (Vit-D Deficiency, Fractures)   Procedures performed this visit: None.  Return in about 3 months (around 09/02/2023) for DM follow up.  __________________________________ Thayer Ohm, DNP, APRN, FNP-BC Primary Care and Sports Medicine [facility] [place]

## 2023-06-03 LAB — IRON,TIBC AND FERRITIN PANEL
Ferritin: 774 ng/mL — ABNORMAL HIGH (ref 15–150)
Iron Saturation: 24 % (ref 15–55)
Iron: 77 ug/dL (ref 27–139)
Total Iron Binding Capacity: 322 ug/dL (ref 250–450)
UIBC: 245 ug/dL (ref 118–369)

## 2023-06-03 LAB — VITAMIN D 25 HYDROXY (VIT D DEFICIENCY, FRACTURES): Vit D, 25-Hydroxy: 34 ng/mL (ref 30.0–100.0)

## 2023-06-06 ENCOUNTER — Encounter: Payer: Self-pay | Admitting: Medical-Surgical

## 2023-06-14 ENCOUNTER — Other Ambulatory Visit (HOSPITAL_BASED_OUTPATIENT_CLINIC_OR_DEPARTMENT_OTHER): Payer: Self-pay

## 2023-06-14 ENCOUNTER — Other Ambulatory Visit: Payer: Self-pay | Admitting: Medical-Surgical

## 2023-06-14 ENCOUNTER — Other Ambulatory Visit: Payer: Self-pay

## 2023-06-14 DIAGNOSIS — E1159 Type 2 diabetes mellitus with other circulatory complications: Secondary | ICD-10-CM

## 2023-06-14 MED ORDER — AMLODIPINE BESYLATE 5 MG PO TABS
5.0000 mg | ORAL_TABLET | Freq: Every day | ORAL | 0 refills | Status: DC
  Filled 2023-06-14: qty 90, 90d supply, fill #0

## 2023-06-26 ENCOUNTER — Other Ambulatory Visit (HOSPITAL_BASED_OUTPATIENT_CLINIC_OR_DEPARTMENT_OTHER): Payer: Self-pay

## 2023-06-26 ENCOUNTER — Other Ambulatory Visit: Payer: Self-pay

## 2023-07-12 ENCOUNTER — Other Ambulatory Visit (HOSPITAL_BASED_OUTPATIENT_CLINIC_OR_DEPARTMENT_OTHER): Payer: Self-pay

## 2023-07-21 ENCOUNTER — Other Ambulatory Visit (HOSPITAL_BASED_OUTPATIENT_CLINIC_OR_DEPARTMENT_OTHER): Payer: Self-pay

## 2023-08-23 ENCOUNTER — Other Ambulatory Visit: Payer: Self-pay

## 2023-08-23 ENCOUNTER — Other Ambulatory Visit: Payer: Self-pay | Admitting: Medical-Surgical

## 2023-08-23 DIAGNOSIS — E1129 Type 2 diabetes mellitus with other diabetic kidney complication: Secondary | ICD-10-CM

## 2023-08-24 ENCOUNTER — Other Ambulatory Visit (HOSPITAL_BASED_OUTPATIENT_CLINIC_OR_DEPARTMENT_OTHER): Payer: Self-pay

## 2023-08-29 ENCOUNTER — Ambulatory Visit: Admitting: Medical-Surgical

## 2023-08-29 DIAGNOSIS — E1129 Type 2 diabetes mellitus with other diabetic kidney complication: Secondary | ICD-10-CM

## 2023-09-01 ENCOUNTER — Ambulatory Visit: Admitting: Medical-Surgical

## 2023-09-14 ENCOUNTER — Other Ambulatory Visit: Payer: Self-pay | Admitting: Medical-Surgical

## 2023-09-14 DIAGNOSIS — E1159 Type 2 diabetes mellitus with other circulatory complications: Secondary | ICD-10-CM

## 2023-09-14 DIAGNOSIS — R809 Proteinuria, unspecified: Secondary | ICD-10-CM

## 2023-09-15 ENCOUNTER — Other Ambulatory Visit (HOSPITAL_BASED_OUTPATIENT_CLINIC_OR_DEPARTMENT_OTHER): Payer: Self-pay

## 2023-09-15 ENCOUNTER — Encounter: Payer: Self-pay | Admitting: Medical-Surgical

## 2023-09-15 ENCOUNTER — Other Ambulatory Visit: Payer: Self-pay

## 2023-09-15 ENCOUNTER — Ambulatory Visit: Admitting: Medical-Surgical

## 2023-09-15 VITALS — BP 104/65 | HR 72 | Resp 20 | Ht 59.0 in | Wt 186.0 lb

## 2023-09-15 DIAGNOSIS — E1169 Type 2 diabetes mellitus with other specified complication: Secondary | ICD-10-CM

## 2023-09-15 DIAGNOSIS — I152 Hypertension secondary to endocrine disorders: Secondary | ICD-10-CM

## 2023-09-15 DIAGNOSIS — E1159 Type 2 diabetes mellitus with other circulatory complications: Secondary | ICD-10-CM

## 2023-09-15 DIAGNOSIS — Z794 Long term (current) use of insulin: Secondary | ICD-10-CM | POA: Diagnosis not present

## 2023-09-15 DIAGNOSIS — F330 Major depressive disorder, recurrent, mild: Secondary | ICD-10-CM | POA: Diagnosis not present

## 2023-09-15 DIAGNOSIS — R809 Proteinuria, unspecified: Secondary | ICD-10-CM | POA: Diagnosis not present

## 2023-09-15 DIAGNOSIS — E785 Hyperlipidemia, unspecified: Secondary | ICD-10-CM

## 2023-09-15 DIAGNOSIS — E1129 Type 2 diabetes mellitus with other diabetic kidney complication: Secondary | ICD-10-CM | POA: Diagnosis not present

## 2023-09-15 LAB — POCT GLYCOSYLATED HEMOGLOBIN (HGB A1C)
HbA1c, POC (controlled diabetic range): 6.8 % (ref 0.0–7.0)
Hemoglobin A1C: 6.8 % — AB (ref 4.0–5.6)

## 2023-09-15 MED ORDER — LISINOPRIL-HYDROCHLOROTHIAZIDE 10-12.5 MG PO TABS
1.0000 | ORAL_TABLET | Freq: Every day | ORAL | 3 refills | Status: AC
  Filled 2023-09-15: qty 90, 90d supply, fill #0
  Filled 2023-12-26: qty 90, 90d supply, fill #1
  Filled 2024-04-19: qty 90, 90d supply, fill #2

## 2023-09-15 MED ORDER — TIRZEPATIDE 10 MG/0.5ML ~~LOC~~ SOAJ
10.0000 mg | SUBCUTANEOUS | 0 refills | Status: DC
  Filled 2023-09-15: qty 2, 28d supply, fill #0
  Filled 2023-10-17: qty 2, 28d supply, fill #1
  Filled 2023-11-06 – 2023-11-10 (×2): qty 2, 28d supply, fill #2

## 2023-09-15 MED ORDER — METFORMIN HCL 1000 MG PO TABS
1000.0000 mg | ORAL_TABLET | Freq: Two times a day (BID) | ORAL | 3 refills | Status: AC
  Filled 2023-09-15: qty 180, 90d supply, fill #0
  Filled 2023-12-26: qty 180, 90d supply, fill #1
  Filled 2024-04-19: qty 180, 90d supply, fill #2

## 2023-09-15 MED ORDER — ROSUVASTATIN CALCIUM 40 MG PO TABS
40.0000 mg | ORAL_TABLET | Freq: Every day | ORAL | 3 refills | Status: AC
  Filled 2023-09-15: qty 90, 90d supply, fill #0
  Filled 2023-12-26: qty 90, 90d supply, fill #1
  Filled 2024-04-19: qty 90, 90d supply, fill #2

## 2023-09-15 MED ORDER — SERTRALINE HCL 100 MG PO TABS
200.0000 mg | ORAL_TABLET | Freq: Every day | ORAL | 3 refills | Status: AC
  Filled 2023-09-15 (×2): qty 180, 90d supply, fill #0
  Filled 2023-12-26: qty 180, 90d supply, fill #1
  Filled 2024-04-19: qty 180, 90d supply, fill #2

## 2023-09-15 MED ORDER — AMLODIPINE BESYLATE 5 MG PO TABS
5.0000 mg | ORAL_TABLET | Freq: Every day | ORAL | 0 refills | Status: DC
  Filled 2023-09-15: qty 90, 90d supply, fill #0

## 2023-09-15 MED ORDER — INSULIN GLARGINE-YFGN 100 UNIT/ML ~~LOC~~ SOPN
20.0000 [IU] | PEN_INJECTOR | Freq: Every day | SUBCUTANEOUS | 11 refills | Status: DC
  Filled 2023-09-15: qty 15, 30d supply, fill #0
  Filled 2023-11-06: qty 15, 30d supply, fill #1
  Filled 2023-12-10: qty 15, 30d supply, fill #2

## 2023-09-15 NOTE — Progress Notes (Unsigned)
        Established patient visit  History, exam, impression, and plan:  1. Type 2 diabetes mellitus with microalbuminuria, with long-term current use of insulin  (HCC) (Primary) Jessica [name] 68 year old female presenting today with a history of type 2 diabetes currently managed with Ozempic  2mg  weekly, Semglee  20units daily, and Metformin  1g BID. Tolerating all the medications well w/o SE. Occasionally, endorses taking extra Semglee  if her sugars are running higher at night. Monitoring sugars regularly. Is frustrated that she has not been able to lose more weight. Is interested in switching to Mounjaro since this seems to be better for weight loss and glucose management. Discussed options. Prior A1c 7.1% about 3 months ago. Recheck today at 6.8%, controlled. UTD on most diabetic care except for her eye exam. Plans to get that scheduled soon. Continue Semglee  and Metformin  as prescribed. Discontinue Ozempic . Start Mounjaro 10mg  weekly.  - POCT HgB A1C - metFORMIN  (GLUCOPHAGE ) 1000 MG tablet; Take 1 tablet (1,000 mg total) by mouth 2 (two) times daily with a meal.  Dispense: 180 tablet; Refill: 3  2. Hypertension associated with diabetes (HCC) Taking Amlodipine  5mg  and Lisinopril -hydrochlorothiazide  10.125mg  daily, tolerating well without SE. Checking her BP occasionally with readings at goal. Denies CP, SOB, palpitations, lower extremity edema, dizziness, headaches, or vision changes. Cardiopulmonary exam normal. BP at goal. Continue Amlodipine  and Lisinopril -hydrochlorothiazide  as prescribed.  - CMP14+EGFR - amLODipine  (NORVASC ) 5 MG tablet; Take 1 tablet (5 mg total) by mouth daily.  Dispense: 90 tablet; Refill: 0  3. Mild episode of recurrent major depressive disorder (HCC) Previously taking Zoloft  100mg  daily but admits that there are times when she takes 200mg  rather than the 100mg . Cites using more of the Zoloft  if she is having to work with a particular coworker that night. Finds this regimen  helpful. Denies SI/HI. No SE. Mood stable today. Continue Zoloft , writing for 200mg  daily but ok to only take 100mg  if desired.  - sertraline  (ZOLOFT ) 100 MG tablet; Take 2 tablets (200 mg total) by mouth daily.  Dispense: 180 tablet; Refill: 3  4. Hyperlipidemia associated with type 2 diabetes mellitus (HCC) Taking Crestor  40mg  daily, tolerating well w/o SE. Follows a low fat heart healthy diet. No concerns with the medication and is compliant with dosing. Declined labs today as she has an upcoming physical. Continue Crestor  as prescribed.  - rosuvastatin  (CRESTOR ) 40 MG tablet; Take 1 tablet (40 mg total) by mouth at bedtime.  Dispense: 90 tablet; Refill: 3   Procedures performed this visit: None.  Return in about 3 months (around 12/16/2023) for annual physical exam or sooner if needed.  __________________________________ Zada FREDRIK Palin, DNP, APRN, FNP-BC Primary Care and Sports Medicine [facility] [place]

## 2023-09-19 ENCOUNTER — Encounter: Payer: Self-pay | Admitting: Family

## 2023-09-19 ENCOUNTER — Other Ambulatory Visit (HOSPITAL_BASED_OUTPATIENT_CLINIC_OR_DEPARTMENT_OTHER): Payer: Self-pay

## 2023-10-04 ENCOUNTER — Encounter: Payer: Self-pay | Admitting: Medical-Surgical

## 2023-10-04 ENCOUNTER — Ambulatory Visit: Admitting: Medical-Surgical

## 2023-10-04 VITALS — BP 115/68 | HR 73 | Resp 20 | Ht 59.0 in | Wt 186.9 lb

## 2023-10-04 DIAGNOSIS — M79674 Pain in right toe(s): Secondary | ICD-10-CM

## 2023-10-04 NOTE — Progress Notes (Signed)
        Established patient visit  Discussed the use of AI scribe software for clinical note transcription with the patient, who gave verbal consent to proceed.  History of Present Illness   Louise Rawson is a 68 year old female with diabetes who presents with right foot pain and swelling.  Right foot pain and swelling - Burning pain and swelling localized to the right second toe, for approximately one month - Pain worsens with ambulation and wearing shoes - Occasional radiation of pain to the dorsum of the foot - Voltaren gel provides partial relief - Acetaminophen  650 mg every eight hours used for symptom management - Similar episode of swelling occurred one year ago without definitive diagnosis        Physical Exam Vitals reviewed.  Constitutional:      General: She is not in acute distress.    Appearance: Normal appearance.  HENT:     Head: Normocephalic and atraumatic.  Cardiovascular:     Rate and Rhythm: Normal rate and regular rhythm.     Pulses: Normal pulses.     Heart sounds: Normal heart sounds. No murmur heard.    No friction rub. No gallop.  Pulmonary:     Effort: Pulmonary effort is normal. No respiratory distress.     Breath sounds: Normal breath sounds. No wheezing.  Musculoskeletal:        General: Swelling (right second toe) and tenderness (right second toe DIP joint and MTP joint) present.  Skin:    General: Skin is warm and dry.  Neurological:     Mental Status: She is alert and oriented to person, place, and time.  Psychiatric:        Mood and Affect: Mood normal.        Behavior: Behavior normal.        Thought Content: Thought content normal.        Judgment: Judgment normal.   Assessment and Plan    Right foot pain Pain in the right foot, second toe, possibly due to gout, osteoarthritis, or stress fracture. Current treatment with Voltaren and Tylenol . - Order uric acid level to assess for gout. Adding CRP and ESR.  Declined imaging  today. - If uric acid normal, order x-ray for osteoarthritis or stress fracture.  General Health Maintenance Diabetic with pending lab tests. - Checking CMP.     Return if symptoms worsen or fail to improve.  __________________________________ Zada FREDRIK Palin, DNP, APRN, FNP-BC Primary Care and Sports Medicine [facility] [place]

## 2023-10-05 ENCOUNTER — Ambulatory Visit: Payer: Self-pay | Admitting: Medical-Surgical

## 2023-10-05 LAB — CMP14+EGFR
ALT: 22 IU/L (ref 0–32)
AST: 23 IU/L (ref 0–40)
Albumin: 4.1 g/dL (ref 3.9–4.9)
Alkaline Phosphatase: 73 IU/L (ref 44–121)
BUN/Creatinine Ratio: 29 — ABNORMAL HIGH (ref 12–28)
BUN: 19 mg/dL (ref 8–27)
Bilirubin Total: 0.4 mg/dL (ref 0.0–1.2)
CO2: 22 mmol/L (ref 20–29)
Calcium: 8.9 mg/dL (ref 8.7–10.3)
Chloride: 90 mmol/L — ABNORMAL LOW (ref 96–106)
Creatinine, Ser: 0.65 mg/dL (ref 0.57–1.00)
Globulin, Total: 2.3 g/dL (ref 1.5–4.5)
Glucose: 188 mg/dL — ABNORMAL HIGH (ref 70–99)
Potassium: 4.1 mmol/L (ref 3.5–5.2)
Sodium: 130 mmol/L — ABNORMAL LOW (ref 134–144)
Total Protein: 6.4 g/dL (ref 6.0–8.5)
eGFR: 98 mL/min/1.73 (ref >59)

## 2023-10-05 LAB — C-REACTIVE PROTEIN: CRP: <1 mg/L (ref 0–10)

## 2023-10-05 LAB — SEDIMENTATION RATE: Sed Rate: 6 mm/h (ref 0–40)

## 2023-10-05 LAB — URIC ACID: Uric Acid: 4.4 mg/dL (ref 3.0–7.2)

## 2023-10-06 ENCOUNTER — Encounter: Payer: Self-pay | Admitting: Medical-Surgical

## 2023-10-17 ENCOUNTER — Other Ambulatory Visit (HOSPITAL_BASED_OUTPATIENT_CLINIC_OR_DEPARTMENT_OTHER): Payer: Self-pay

## 2023-11-07 ENCOUNTER — Other Ambulatory Visit: Payer: Self-pay

## 2023-11-10 ENCOUNTER — Other Ambulatory Visit (HOSPITAL_BASED_OUTPATIENT_CLINIC_OR_DEPARTMENT_OTHER): Payer: Self-pay

## 2023-11-13 ENCOUNTER — Ambulatory Visit: Admitting: Medical-Surgical

## 2023-11-13 NOTE — Progress Notes (Deleted)
        Established patient visit   History of Present Illness   Discussed the use of AI scribe software for clinical note transcription with the patient, who gave verbal consent to proceed.  History of Present Illness            Physical Exam   Physical Exam  Assessment & Plan   Assessment and Plan               Follow up   No follow-ups on file.  __________________________________ Zada FREDRIK Palin, DNP, APRN, FNP-BC Primary Care and Sports Medicine [facility] [place]

## 2023-11-14 ENCOUNTER — Other Ambulatory Visit (HOSPITAL_BASED_OUTPATIENT_CLINIC_OR_DEPARTMENT_OTHER): Payer: Self-pay

## 2023-11-21 ENCOUNTER — Encounter: Payer: Self-pay | Admitting: Sports Medicine

## 2023-11-28 ENCOUNTER — Ambulatory Visit (INDEPENDENT_AMBULATORY_CARE_PROVIDER_SITE_OTHER): Admitting: Medical-Surgical

## 2023-11-28 ENCOUNTER — Encounter: Payer: Self-pay | Admitting: Medical-Surgical

## 2023-11-28 VITALS — BP 119/70 | HR 69 | Resp 20 | Ht 59.0 in | Wt 182.0 lb

## 2023-11-28 DIAGNOSIS — E1169 Type 2 diabetes mellitus with other specified complication: Secondary | ICD-10-CM | POA: Diagnosis not present

## 2023-11-28 DIAGNOSIS — Z794 Long term (current) use of insulin: Secondary | ICD-10-CM | POA: Diagnosis not present

## 2023-11-28 DIAGNOSIS — R809 Proteinuria, unspecified: Secondary | ICD-10-CM | POA: Diagnosis not present

## 2023-11-28 DIAGNOSIS — Z0000 Encounter for general adult medical examination without abnormal findings: Secondary | ICD-10-CM | POA: Diagnosis not present

## 2023-11-28 DIAGNOSIS — E785 Hyperlipidemia, unspecified: Secondary | ICD-10-CM

## 2023-11-28 DIAGNOSIS — R7989 Other specified abnormal findings of blood chemistry: Secondary | ICD-10-CM

## 2023-11-28 DIAGNOSIS — E1129 Type 2 diabetes mellitus with other diabetic kidney complication: Secondary | ICD-10-CM

## 2023-11-28 DIAGNOSIS — Z23 Encounter for immunization: Secondary | ICD-10-CM | POA: Diagnosis not present

## 2023-11-28 NOTE — Progress Notes (Signed)
 Complete physical exam  Patient: Jessica [name]   DOB: [birth year]   68 y.o. Female  MRN: [id number]  Subjective:    Chief Complaint  Patient presents with   Annual Exam    Millisa Giarrusso Petrea is a 68 y.o. female who presents today for a complete physical exam. She reports consuming a general diet. Walking for exercise. She generally feels well. She reports sleeping poorly. She does not have additional problems to discuss today.   Most recent fall risk assessment:    11/28/2023    8:41 AM  Fall Risk   Falls in the past year? 0  Number falls in past yr: 0  Injury with Fall? 0  Risk for fall due to : No Fall Risks  Follow up Falls evaluation completed     Most recent depression screenings:    11/28/2023    8:41 AM 09/15/2023    1:34 PM  PHQ 2/9 Scores  PHQ - 2 Score 3 5  PHQ- 9 Score 13 15    Vision:Not within last year  and Dental: No current dental problems and Receives regular dental care    Patient Care Team: Willo Mini, NP as PCP - General (Nurse Practitioner)   Outpatient Medications Prior to Visit  Medication Sig   amLODipine  (NORVASC ) 5 MG tablet Take 1 tablet (5 mg total) by mouth daily.   insulin  glargine-yfgn (SEMGLEE ) 100 UNIT/ML Pen Inject 20-30 Units into the skin at bedtime. INCREASE BY 2 UNITS EVERY 3 DAYS UNTIL FBS CONSISTENTLY 90-130 THEN STAY AT THAT DOSE. Max daily dose: 50 units.   lisinopril -hydrochlorothiazide  (ZESTORETIC ) 10-12.5 MG tablet Take 1 tablet by mouth daily.   Melatonin 5 MG CHEW Chew 5 mg by mouth at bedtime as needed (for sleep).   meloxicam  (MOBIC ) 15 MG tablet One tablet by mouth every 24 hours with a meal for 2 weeks, then once every 24 hours as needed for pain.   metFORMIN  (GLUCOPHAGE ) 1000 MG tablet Take 1 tablet (1,000 mg total) by mouth 2 (two) times daily with a meal.   Multiple Vitamins-Minerals (EYE VITAMINS PO) Take 1 tablet by mouth daily.   rosuvastatin  (CRESTOR ) 40 MG tablet Take 1 tablet (40 mg total) by  mouth at bedtime.   sertraline  (ZOLOFT ) 100 MG tablet Take 2 tablets (200 mg total) by mouth daily.   Specialty Vitamins Products (MAGNESIUM, AMINO ACID CHELATE,) 133 MG tablet Take 1 tablet by mouth daily.   tirzepatide  (MOUNJARO ) 10 MG/0.5ML Pen Inject 10 mg into the skin once a week.   No facility-administered medications prior to visit.    Review of Systems  Constitutional:  Negative for chills, fever, malaise/fatigue and weight loss.  HENT:  Negative for congestion, ear pain, hearing loss, sinus pain and sore throat.   Eyes:  Negative for blurred vision, photophobia and pain.  Respiratory:  Negative for cough, shortness of breath and wheezing.   Cardiovascular:  Negative for chest pain, palpitations and leg swelling.  Gastrointestinal:  Negative for abdominal pain, constipation, diarrhea, heartburn, nausea and vomiting.  Genitourinary:  Negative for dysuria, frequency and urgency.  Musculoskeletal:  Negative for falls and neck pain.  Skin:  Negative for itching and rash.  Neurological:  Negative for dizziness, weakness and headaches.  Endo/Heme/Allergies:  Negative for polydipsia. Does not bruise/bleed easily.  Psychiatric/Behavioral:  Negative for depression, substance abuse and suicidal ideas. The patient is not nervous/anxious.      Objective:    BP 119/70 (BP Location: Right Arm, Cuff Size:  Normal)   Pulse 69   Resp 20   Ht 4' 11 (1.499 m)   Wt 182 lb (82.6 kg)   SpO2 100%   BMI 36.76 kg/m    Physical Exam Vitals reviewed.  Constitutional:      General: She is not in acute distress.    Appearance: Normal appearance. She is obese. She is not ill-appearing.  HENT:     Head: Normocephalic and atraumatic.     Right Ear: Tympanic membrane, ear canal and external ear normal. There is no impacted cerumen.     Left Ear: Tympanic membrane, ear canal and external ear normal. There is no impacted cerumen.     Nose: Nose normal. No congestion or rhinorrhea.     Mouth/Throat:      Mouth: Mucous membranes are moist.     Pharynx: No oropharyngeal exudate or posterior oropharyngeal erythema.  Eyes:     General: No scleral icterus.       Right eye: No discharge.        Left eye: No discharge.     Extraocular Movements: Extraocular movements intact.     Conjunctiva/sclera: Conjunctivae normal.     Pupils: Pupils are equal, round, and reactive to light.  Neck:     Thyroid: No thyromegaly.     Vascular: No carotid bruit or JVD.     Trachea: Trachea normal.  Cardiovascular:     Rate and Rhythm: Normal rate and regular rhythm.     Pulses: Normal pulses.     Heart sounds: Normal heart sounds. No murmur heard.    No friction rub. No gallop.  Pulmonary:     Effort: Pulmonary effort is normal. No respiratory distress.     Breath sounds: Normal breath sounds. No wheezing.  Abdominal:     General: Bowel sounds are normal. There is no distension.     Palpations: Abdomen is soft.     Tenderness: There is no abdominal tenderness. There is no guarding.  Musculoskeletal:        General: Normal range of motion.     Cervical back: Normal range of motion and neck supple.  Lymphadenopathy:     Cervical: No cervical adenopathy.  Skin:    General: Skin is warm and dry.  Neurological:     Mental Status: She is alert and oriented to person, place, and time.     Cranial Nerves: No cranial nerve deficit.  Psychiatric:        Mood and Affect: Mood normal.        Behavior: Behavior normal.        Thought Content: Thought content normal.        Judgment: Judgment normal.      No results found for any visits on 11/28/23.     Assessment & Plan:    Routine Health Maintenance and Physical Exam  Immunization History  Administered Date(s) Administered   Fluad Trivalent(High Dose 65+) 11/24/2022   Hepatitis B 11/27/2015, 04/07/2017, 11/06/2017   Hepatitis B, PED/ADOLESCENT 11/27/2015, 04/07/2017, 11/06/2017   Influenza Whole 03/21/2005   Influenza,inj,Quad PF,6+ Mos  12/18/2019, 11/19/2021   Influenza-Unspecified 01/19/2019, 12/19/2020   PFIZER(Purple Top)SARS-COV-2 Vaccination 05/31/2019, 06/21/2019, 02/27/2020   PNEUMOCOCCAL CONJUGATE-20 11/24/2022   Pneumococcal Polysaccharide-23 06/21/2007, 09/11/2012   Td 03/21/2005   Tdap 01/12/2010, 04/05/2021   Zoster Recombinant(Shingrix) 09/14/2020, 04/05/2021    Health Maintenance  Topic Date Due   OPHTHALMOLOGY EXAM  09/03/2021   Influenza Vaccine  10/20/2023   FOOT EXAM  11/24/2023  COVID-19 Vaccine (4 - 2025-26 season) 12/14/2023 (Originally 11/20/2023)   HEMOGLOBIN A1C  03/16/2024   Diabetic kidney evaluation - Urine ACR  06/01/2024   Diabetic kidney evaluation - eGFR measurement  10/03/2024   Fecal DNA (Cologuard)  12/23/2024   MAMMOGRAM  04/04/2025   DTaP/Tdap/Td (4 - Td or Tdap) 04/06/2031   Pneumococcal Vaccine: 50+ Years  Completed   DEXA SCAN  Completed   Hepatitis C Screening  Completed   Zoster Vaccines- Shingrix  Completed   HPV VACCINES  Aged Out   Meningococcal B Vaccine  Aged Out   Hepatitis B Vaccines 19-59 Average Risk  Discontinued    Discussed health benefits of physical activity, and encouraged her to engage in regular exercise appropriate for her age and condition.  1. Annual physical exam (Primary) Checking labs as below. UTD on preventative care but would strongly recommend she update her diabetic eye exam soon. Wellness information provided with AVS. - Lipid panel - Basic Metabolic Panel (BMET)  2. Elevated ferritin level Checking ferritin.   - Ferritin  3. Type 2 diabetes mellitus with microalbuminuria, with long-term current use of insulin  (HCC) Checking BMP - Basic Metabolic Panel (BMET)  4. Hyperlipidemia associated with type 2 diabetes mellitus (HCC) Checking lipids. - Lipid panel  Return in about 7 weeks (around 01/16/2024) for DM follow up or sooner if needed.     James Lafalce, NP

## 2023-11-29 ENCOUNTER — Ambulatory Visit: Payer: Self-pay | Admitting: Medical-Surgical

## 2023-11-29 LAB — LIPID PANEL
Chol/HDL Ratio: 2.7 ratio (ref 0.0–4.4)
Cholesterol, Total: 116 mg/dL (ref 100–199)
HDL: 43 mg/dL (ref >39)
LDL Chol Calc (NIH): 52 mg/dL (ref 0–99)
Triglycerides: 118 mg/dL (ref 0–149)
VLDL Cholesterol Cal: 21 mg/dL (ref 5–40)

## 2023-11-29 LAB — FERRITIN: Ferritin: 563 ng/mL — ABNORMAL HIGH (ref 15–150)

## 2023-11-29 LAB — BASIC METABOLIC PANEL WITH GFR
BUN/Creatinine Ratio: 27 (ref 12–28)
BUN: 20 mg/dL (ref 8–27)
CO2: 23 mmol/L (ref 20–29)
Calcium: 9.4 mg/dL (ref 8.7–10.3)
Chloride: 94 mmol/L — ABNORMAL LOW (ref 96–106)
Creatinine, Ser: 0.75 mg/dL (ref 0.57–1.00)
Glucose: 104 mg/dL — ABNORMAL HIGH (ref 70–99)
Potassium: 4.1 mmol/L (ref 3.5–5.2)
Sodium: 133 mmol/L — ABNORMAL LOW (ref 134–144)
eGFR: 88 mL/min/1.73 (ref >59)

## 2023-12-10 ENCOUNTER — Other Ambulatory Visit: Payer: Self-pay | Admitting: Medical-Surgical

## 2023-12-10 DIAGNOSIS — R809 Proteinuria, unspecified: Secondary | ICD-10-CM

## 2023-12-11 ENCOUNTER — Other Ambulatory Visit (HOSPITAL_BASED_OUTPATIENT_CLINIC_OR_DEPARTMENT_OTHER): Payer: Self-pay

## 2023-12-11 ENCOUNTER — Other Ambulatory Visit: Payer: Self-pay

## 2023-12-11 MED ORDER — MOUNJARO 10 MG/0.5ML ~~LOC~~ SOAJ
10.0000 mg | SUBCUTANEOUS | 0 refills | Status: DC
  Filled 2023-12-11: qty 6, 84d supply, fill #0

## 2023-12-26 ENCOUNTER — Other Ambulatory Visit (HOSPITAL_BASED_OUTPATIENT_CLINIC_OR_DEPARTMENT_OTHER): Payer: Self-pay

## 2023-12-26 ENCOUNTER — Other Ambulatory Visit: Payer: Self-pay | Admitting: Medical-Surgical

## 2023-12-26 ENCOUNTER — Other Ambulatory Visit: Payer: Self-pay

## 2023-12-26 DIAGNOSIS — E1159 Type 2 diabetes mellitus with other circulatory complications: Secondary | ICD-10-CM

## 2023-12-26 MED ORDER — AMLODIPINE BESYLATE 5 MG PO TABS
5.0000 mg | ORAL_TABLET | Freq: Every day | ORAL | 0 refills | Status: AC
  Filled 2023-12-26: qty 90, 90d supply, fill #0

## 2024-01-15 ENCOUNTER — Encounter: Payer: Self-pay | Admitting: Medical-Surgical

## 2024-01-16 ENCOUNTER — Other Ambulatory Visit: Payer: Self-pay | Admitting: Medical Genetics

## 2024-01-19 ENCOUNTER — Ambulatory Visit: Admitting: Medical-Surgical

## 2024-01-22 ENCOUNTER — Other Ambulatory Visit: Payer: Self-pay

## 2024-01-30 ENCOUNTER — Encounter: Payer: Self-pay | Admitting: Medical-Surgical

## 2024-01-30 ENCOUNTER — Ambulatory Visit (INDEPENDENT_AMBULATORY_CARE_PROVIDER_SITE_OTHER): Admitting: Medical-Surgical

## 2024-01-30 VITALS — BP 130/72 | HR 69 | Resp 20 | Ht 59.0 in | Wt 180.0 lb

## 2024-01-30 DIAGNOSIS — E1141 Type 2 diabetes mellitus with diabetic mononeuropathy: Secondary | ICD-10-CM

## 2024-01-30 DIAGNOSIS — F330 Major depressive disorder, recurrent, mild: Secondary | ICD-10-CM | POA: Diagnosis not present

## 2024-01-30 DIAGNOSIS — Z794 Long term (current) use of insulin: Secondary | ICD-10-CM | POA: Diagnosis not present

## 2024-01-30 DIAGNOSIS — R809 Proteinuria, unspecified: Secondary | ICD-10-CM | POA: Diagnosis not present

## 2024-01-30 DIAGNOSIS — E1129 Type 2 diabetes mellitus with other diabetic kidney complication: Secondary | ICD-10-CM | POA: Diagnosis not present

## 2024-01-30 LAB — POCT GLYCOSYLATED HEMOGLOBIN (HGB A1C)
HbA1c, POC (controlled diabetic range): 6.4 % (ref 0.0–7.0)
Hemoglobin A1C: 6.4 % — AB (ref 4.0–5.6)

## 2024-01-30 NOTE — Progress Notes (Addendum)
 Established Patient Office Visit  Subjective   Patient ID: Jessica [name], female    DOB: Sep 06, 1955  Age: 68 y.o. MRN: [id number]  Chief Complaint  Patient presents with   Diabetes    Follow up    HPI  68 year old female presents to the office for a DM follow up.   Type 2 Diabetes Mellitus Patient is currently taking Metformin  1000 mg two times daily with meals, 10 mg Mounjaro  weekly, and Semglee  20-30 units at bedtime.She is making changes to her diet to help with blood sugar control. Patient reports doing well with her current medications. She does check her blood sugars at home with average fasting being around 98 and afternoon average around 200.  Previous A1C 6.8%  in June 2025.  Diabetic Neuropathy Patient reports having numbness, tingling, pain, and decreased sensation in her right foot. She is not currently taking medication for her pain. She is not interested in medication at this time, and states that symptoms are manageable.   Major Depressive Disorder Patient is currently taking Zoloft  100 mg twice a day. Patient states that she has been tapering off of the Zoloft  for the last 3-4 months taking it every other day mainly tapering the night dose. Patient has increased PHQ9 today ans states that depression typically increases this time of year. PHQ 9 score today is 15.  Review of Systems  Constitutional: Negative.   HENT: Negative.    Eyes: Negative.   Respiratory: Negative.    Cardiovascular: Negative.   Gastrointestinal: Negative.   Genitourinary: Negative.   Musculoskeletal: Negative.   Skin: Negative.   Neurological:  Positive for tingling.       Numbness,Tingling, and pain in the right foot  Endo/Heme/Allergies: Negative.   Psychiatric/Behavioral: Negative.        Objective:     BP 130/72 (BP Location: Left Arm, Cuff Size: Normal)   Pulse 69   Resp 20   Ht 4' 11 (1.499 m)   Wt 81.6 kg   SpO2 100%   BMI 36.36 kg/m  BP Readings from Last 3  Encounters:  01/30/24 130/72  11/28/23 119/70  10/04/23 115/68   Wt Readings from Last 3 Encounters:  01/30/24 81.6 kg  11/28/23 82.6 kg  10/04/23 84.8 kg      Physical Exam Vitals and nursing note reviewed.  Constitutional:      General: She is not in acute distress.    Appearance: Normal appearance.  Cardiovascular:     Rate and Rhythm: Normal rate and regular rhythm.     Pulses: Normal pulses.     Heart sounds: Normal heart sounds.  Pulmonary:     Effort: Pulmonary effort is normal.     Breath sounds: Normal breath sounds.  Neurological:     General: No focal deficit present.     Mental Status: She is alert and oriented to person, place, and time.  Psychiatric:        Mood and Affect: Mood normal.        Behavior: Behavior normal.        Thought Content: Thought content normal.        Judgment: Judgment normal.      Results for orders placed or performed in visit on 01/30/24  POCT HgB A1C  Result Value Ref Range   Hemoglobin A1C 6.4 (A) 4.0 - 5.6 %   HbA1c POC (<> result, manual entry)     HbA1c, POC (prediabetic range)  HbA1c, POC (controlled diabetic range) 6.4 0.0 - 7.0 %    Last metabolic panel Lab Results  Component Value Date   GLUCOSE 104 (H) 11/28/2023   NA 133 (L) 11/28/2023   K 4.1 11/28/2023   CL 94 (L) 11/28/2023   CO2 23 11/28/2023   BUN 20 11/28/2023   CREATININE 0.75 11/28/2023   EGFR 88 11/28/2023   CALCIUM  9.4 11/28/2023   PROT 6.4 10/04/2023   ALBUMIN 4.1 10/04/2023   LABGLOB 2.3 10/04/2023   BILITOT 0.4 10/04/2023   ALKPHOS 73 10/04/2023   AST 23 10/04/2023   ALT 22 10/04/2023   ANIONGAP 15 05/31/2022   Last hemoglobin A1c Lab Results  Component Value Date   HGBA1C 6.4 (A) 01/30/2024   HGBA1C 6.4 01/30/2024    The ASCVD Risk score (Arnett DK, et al., 2019) failed to calculate for the following reasons:   The valid total cholesterol range is 130 to 320 mg/dL    Assessment & Plan:  1. Type 2 diabetes mellitus with  microalbuminuria, with long-term current use of insulin  (HCC) (Primary) Continue with current treatment plan Last A1C 6.8% in June 2025 A1C 6.4 % today  2. Mild episode of recurrent major depressive disorder -Continue taking Zoloft  100 mg BID -PHQ 9 score today is 15  3. Diabetic 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 (HCC) -Discussed options for neuropathy treatment such as Gabapentin  or Lyrica -Discussed risk of injury associate with neuropathy -Patient to consider treatment in the future if symptoms worsen    Return in about 6 months (around 07/29/2024) for Chronic Disease.    Derrek JINNY Freund, NP Student

## 2024-01-30 NOTE — Progress Notes (Signed)
 Medical screening examination/treatment was performed by qualified clinical staff member and as supervising provider I was immediately available for consultation/collaboration. I have reviewed documentation and agree with assessment and plan.  Thayer Ohm, DNP, APRN, FNP-BC [facility] and [facility] Medicine

## 2024-02-16 ENCOUNTER — Other Ambulatory Visit (HOSPITAL_BASED_OUTPATIENT_CLINIC_OR_DEPARTMENT_OTHER): Payer: Self-pay

## 2024-02-28 ENCOUNTER — Other Ambulatory Visit: Payer: Self-pay | Admitting: Medical-Surgical

## 2024-02-28 DIAGNOSIS — E1129 Type 2 diabetes mellitus with other diabetic kidney complication: Secondary | ICD-10-CM

## 2024-02-29 ENCOUNTER — Other Ambulatory Visit: Payer: Self-pay

## 2024-02-29 ENCOUNTER — Other Ambulatory Visit (HOSPITAL_BASED_OUTPATIENT_CLINIC_OR_DEPARTMENT_OTHER): Payer: Self-pay

## 2024-02-29 MED ORDER — MOUNJARO 10 MG/0.5ML ~~LOC~~ SOAJ
10.0000 mg | SUBCUTANEOUS | 0 refills | Status: AC
  Filled 2024-02-29: qty 6, 84d supply, fill #0

## 2024-03-05 ENCOUNTER — Other Ambulatory Visit (HOSPITAL_BASED_OUTPATIENT_CLINIC_OR_DEPARTMENT_OTHER): Payer: Self-pay

## 2024-03-26 DIAGNOSIS — E1129 Type 2 diabetes mellitus with other diabetic kidney complication: Secondary | ICD-10-CM

## 2024-03-26 MED ORDER — INSULIN GLARGINE-YFGN 100 UNIT/ML ~~LOC~~ SOPN: 20.0000 [IU] | PEN_INJECTOR | Freq: Every day | SUBCUTANEOUS | 0 refills | Status: AC

## 2024-07-30 ENCOUNTER — Ambulatory Visit: Admitting: Medical-Surgical
